# Patient Record
Sex: Male | Born: 1945 | Race: White | Hispanic: No | Marital: Married | Smoking: Never smoker
Health system: Southern US, Community
[De-identification: ages and names within clinical notes are randomized; demographics above are authoritative.]

## PROBLEM LIST (undated history)

## (undated) DIAGNOSIS — R7989 Other specified abnormal findings of blood chemistry: Secondary | ICD-10-CM

## (undated) DIAGNOSIS — R002 Palpitations: Secondary | ICD-10-CM

## (undated) DIAGNOSIS — R3915 Urgency of urination: Secondary | ICD-10-CM

## (undated) DIAGNOSIS — K219 Gastro-esophageal reflux disease without esophagitis: Secondary | ICD-10-CM

## (undated) DIAGNOSIS — R238 Other skin changes: Secondary | ICD-10-CM

## (undated) DIAGNOSIS — R011 Cardiac murmur, unspecified: Secondary | ICD-10-CM

## (undated) DIAGNOSIS — M1990 Unspecified osteoarthritis, unspecified site: Secondary | ICD-10-CM

## (undated) DIAGNOSIS — T7840XA Allergy, unspecified, initial encounter: Secondary | ICD-10-CM

## (undated) DIAGNOSIS — C4490 Unspecified malignant neoplasm of skin, unspecified: Secondary | ICD-10-CM

## (undated) DIAGNOSIS — I10 Essential (primary) hypertension: Secondary | ICD-10-CM

## (undated) DIAGNOSIS — R233 Spontaneous ecchymoses: Secondary | ICD-10-CM

## (undated) DIAGNOSIS — D696 Thrombocytopenia, unspecified: Secondary | ICD-10-CM

## (undated) HISTORY — PX: COLONOSCOPY W/ POLYPECTOMY: SHX1380

## (undated) HISTORY — DX: Other specified abnormal findings of blood chemistry: R79.89

## (undated) HISTORY — PX: TOTAL HIP ARTHROPLASTY: SHX124

## (undated) HISTORY — DX: Essential (primary) hypertension: I10

## (undated) HISTORY — DX: Allergy, unspecified, initial encounter: T78.40XA

## (undated) HISTORY — DX: Gastro-esophageal reflux disease without esophagitis: K21.9

## (undated) HISTORY — PX: BONE MARROW TRANSPLANT: SHX200

## (undated) HISTORY — PX: HERNIA REPAIR: SHX51

## (undated) HISTORY — DX: Palpitations: R00.2

## (undated) HISTORY — DX: Cardiac murmur, unspecified: R01.1

---

## 2002-03-26 ENCOUNTER — Encounter: Payer: Self-pay | Admitting: Emergency Medicine

## 2002-03-26 ENCOUNTER — Ambulatory Visit (HOSPITAL_COMMUNITY): Admission: EM | Admit: 2002-03-26 | Discharge: 2002-03-26 | Payer: Self-pay | Admitting: Emergency Medicine

## 2003-02-25 ENCOUNTER — Encounter (INDEPENDENT_AMBULATORY_CARE_PROVIDER_SITE_OTHER): Payer: Self-pay | Admitting: *Deleted

## 2003-02-25 ENCOUNTER — Ambulatory Visit (HOSPITAL_BASED_OUTPATIENT_CLINIC_OR_DEPARTMENT_OTHER): Admission: RE | Admit: 2003-02-25 | Discharge: 2003-02-25 | Payer: Self-pay | Admitting: Plastic Surgery

## 2004-08-17 ENCOUNTER — Ambulatory Visit: Payer: Self-pay | Admitting: Gastroenterology

## 2005-10-29 ENCOUNTER — Ambulatory Visit: Payer: Self-pay | Admitting: Gastroenterology

## 2005-12-10 ENCOUNTER — Ambulatory Visit: Payer: Self-pay | Admitting: Gastroenterology

## 2005-12-11 ENCOUNTER — Ambulatory Visit: Payer: Self-pay | Admitting: Endocrinology

## 2006-02-07 ENCOUNTER — Ambulatory Visit: Payer: Self-pay | Admitting: Endocrinology

## 2006-02-07 LAB — CONVERTED CEMR LAB
ALT: 15 units/L (ref 0–40)
AST: 20 units/L (ref 0–37)
Albumin: 3.6 g/dL (ref 3.5–5.2)
Alkaline Phosphatase: 61 units/L (ref 39–117)
BUN: 19 mg/dL (ref 6–23)
Bacteria, U Microscopic: NEGATIVE /hpf
Basophils Absolute: 0 10*3/uL (ref 0.0–0.1)
Basophils Relative: 0.7 % (ref 0.0–1.0)
Bilirubin Urine: NEGATIVE
CO2: 32 meq/L (ref 19–32)
Calcium: 9.1 mg/dL (ref 8.4–10.5)
Chloride: 107 meq/L (ref 96–112)
Chol/HDL Ratio, serum: 3.1
Cholesterol: 160 mg/dL (ref 0–200)
Creatinine, Ser: 0.9 mg/dL (ref 0.4–1.5)
Crystals: NEGATIVE
Eosinophil percent: 4.4 % (ref 0.0–5.0)
GFR calc non Af Amer: 91 mL/min
Glomerular Filtration Rate, Af Am: 110 mL/min/{1.73_m2}
Glucose, Bld: 107 mg/dL — ABNORMAL HIGH (ref 70–99)
HCT: 37.8 % — ABNORMAL LOW (ref 39.0–52.0)
HDL: 51.9 mg/dL (ref >39.0)
Hemoglobin: 13.2 g/dL (ref 13.0–17.0)
Ketones, ur: NEGATIVE mg/dL
LDL Cholesterol: 93 mg/dL (ref 0–99)
Leukocytes, UA: NEGATIVE
Lymphocytes Relative: 18 % (ref 12.0–46.0)
MCHC: 34.8 g/dL (ref 30.0–36.0)
MCV: 81.6 fL (ref 78.0–100.0)
Monocytes Absolute: 0.5 10*3/uL (ref 0.2–0.7)
Monocytes Relative: 6.7 % (ref 3.0–11.0)
Neutro Abs: 4.9 10*3/uL (ref 1.4–7.7)
Neutrophils Relative %: 70.2 % (ref 43.0–77.0)
Nitrite: NEGATIVE
Platelets: 437 10*3/uL — ABNORMAL HIGH (ref 150–400)
Potassium: 4.2 meq/L (ref 3.5–5.1)
RBC: 4.63 M/uL (ref 4.22–5.81)
RDW: 12.7 % (ref 11.5–14.6)
Sodium: 143 meq/L (ref 135–145)
Specific Gravity, Urine: >=1.03 (ref 1.000–1.03)
TSH: 0.88 microintl units/mL (ref 0.35–5.50)
Total Bilirubin: 0.6 mg/dL (ref 0.3–1.2)
Total Protein, Urine: NEGATIVE mg/dL
Total Protein: 6.2 g/dL (ref 6.0–8.3)
Triglyceride fasting, serum: 77 mg/dL (ref 0–149)
Urine Glucose: NEGATIVE mg/dL
Urobilinogen, UA: 0.2 (ref 0.0–1.0)
VLDL: 15 mg/dL (ref 0–40)
WBC: 7 10*3/uL (ref 4.5–10.5)
pH: 6 (ref 5.0–8.0)

## 2006-02-11 ENCOUNTER — Ambulatory Visit: Payer: Self-pay | Admitting: Endocrinology

## 2006-03-08 ENCOUNTER — Ambulatory Visit (HOSPITAL_COMMUNITY): Admission: RE | Admit: 2006-03-08 | Discharge: 2006-03-08 | Payer: Self-pay | Admitting: Endocrinology

## 2006-03-22 ENCOUNTER — Ambulatory Visit: Payer: Self-pay | Admitting: Gastroenterology

## 2006-04-12 ENCOUNTER — Encounter: Payer: Self-pay | Admitting: Gastroenterology

## 2006-04-12 ENCOUNTER — Ambulatory Visit: Payer: Self-pay | Admitting: Gastroenterology

## 2006-04-12 LAB — HM COLONOSCOPY

## 2006-10-11 ENCOUNTER — Encounter: Payer: Self-pay | Admitting: Endocrinology

## 2006-10-11 DIAGNOSIS — K219 Gastro-esophageal reflux disease without esophagitis: Secondary | ICD-10-CM | POA: Insufficient documentation

## 2007-03-07 ENCOUNTER — Ambulatory Visit: Payer: Self-pay | Admitting: Gastroenterology

## 2007-06-20 ENCOUNTER — Telehealth: Payer: Self-pay | Admitting: Gastroenterology

## 2007-07-23 ENCOUNTER — Telehealth: Payer: Self-pay | Admitting: Gastroenterology

## 2007-08-11 ENCOUNTER — Telehealth (INDEPENDENT_AMBULATORY_CARE_PROVIDER_SITE_OTHER): Payer: Self-pay | Admitting: *Deleted

## 2007-08-18 ENCOUNTER — Telehealth: Payer: Self-pay | Admitting: Gastroenterology

## 2007-08-27 ENCOUNTER — Telehealth: Payer: Self-pay | Admitting: Gastroenterology

## 2008-03-26 ENCOUNTER — Ambulatory Visit: Payer: Self-pay | Admitting: Endocrinology

## 2008-03-26 DIAGNOSIS — H6120 Impacted cerumen, unspecified ear: Secondary | ICD-10-CM

## 2009-12-05 ENCOUNTER — Encounter: Payer: Self-pay | Admitting: Endocrinology

## 2010-03-14 NOTE — Miscellaneous (Signed)
Summary: Immunization Entry   Immunization History:  Influenza Immunization History:    Influenza:  historical (12/03/2009)   Injection Site: Right Deltoid Route: IM Manu: [employer] Exp. Date: 08/12/2010 Lot Number: [id number]  immunization given at [facility] [address] Brenton Grills [place]  December 05, 2009 12:05 PM

## 2010-06-02 ENCOUNTER — Other Ambulatory Visit (INDEPENDENT_AMBULATORY_CARE_PROVIDER_SITE_OTHER): Payer: Medicare Other

## 2010-06-02 ENCOUNTER — Ambulatory Visit (INDEPENDENT_AMBULATORY_CARE_PROVIDER_SITE_OTHER): Payer: Medicare Other | Admitting: Endocrinology

## 2010-06-02 ENCOUNTER — Encounter: Payer: Self-pay | Admitting: Endocrinology

## 2010-06-02 ENCOUNTER — Other Ambulatory Visit: Payer: Self-pay | Admitting: Gastroenterology

## 2010-06-02 DIAGNOSIS — K219 Gastro-esophageal reflux disease without esophagitis: Secondary | ICD-10-CM

## 2010-06-02 DIAGNOSIS — E7800 Pure hypercholesterolemia, unspecified: Secondary | ICD-10-CM

## 2010-06-02 DIAGNOSIS — C61 Malignant neoplasm of prostate: Secondary | ICD-10-CM

## 2010-06-02 DIAGNOSIS — K4090 Unilateral inguinal hernia, without obstruction or gangrene, not specified as recurrent: Secondary | ICD-10-CM | POA: Insufficient documentation

## 2010-06-02 DIAGNOSIS — E119 Type 2 diabetes mellitus without complications: Secondary | ICD-10-CM

## 2010-06-02 DIAGNOSIS — Z0000 Encounter for general adult medical examination without abnormal findings: Secondary | ICD-10-CM

## 2010-06-02 DIAGNOSIS — N400 Enlarged prostate without lower urinary tract symptoms: Secondary | ICD-10-CM

## 2010-06-02 DIAGNOSIS — Z125 Encounter for screening for malignant neoplasm of prostate: Secondary | ICD-10-CM | POA: Insufficient documentation

## 2010-06-02 DIAGNOSIS — Z79899 Other long term (current) drug therapy: Secondary | ICD-10-CM

## 2010-06-02 DIAGNOSIS — Z136 Encounter for screening for cardiovascular disorders: Secondary | ICD-10-CM

## 2010-06-02 DIAGNOSIS — D649 Anemia, unspecified: Secondary | ICD-10-CM

## 2010-06-02 LAB — CBC WITH DIFFERENTIAL/PLATELET
Basophils Relative: 1.2 % (ref 0.0–3.0)
Eosinophils Absolute: 0.1 10*3/uL (ref 0.0–0.7)
HCT: 38.2 % — ABNORMAL LOW (ref 39.0–52.0)
Hemoglobin: 12.9 g/dL — ABNORMAL LOW (ref 13.0–17.0)
Lymphs Abs: 0.8 10*3/uL (ref 0.7–4.0)
MCHC: 33.8 g/dL (ref 30.0–36.0)
MCV: 83.1 fl (ref 78.0–100.0)
Monocytes Absolute: 0.4 10*3/uL (ref 0.1–1.0)
Neutro Abs: 4.2 10*3/uL (ref 1.4–7.7)
RBC: 4.6 Mil/uL (ref 4.22–5.81)
RDW: 15.4 % — ABNORMAL HIGH (ref 11.5–14.6)

## 2010-06-02 LAB — HEPATIC FUNCTION PANEL
AST: 27 U/L (ref 0–37)
Alkaline Phosphatase: 61 U/L (ref 39–117)
Total Bilirubin: 0.4 mg/dL (ref 0.3–1.2)

## 2010-06-02 LAB — LIPID PANEL
HDL: 49.4 mg/dL (ref >39.00)
Total CHOL/HDL Ratio: 3

## 2010-06-02 LAB — PSA: PSA: 2.67 ng/mL (ref 0.10–4.00)

## 2010-06-02 MED ORDER — TERBINAFINE HCL 250 MG PO TABS: 250.0000 mg | ORAL_TABLET | Freq: Every day | ORAL | Status: DC

## 2010-06-02 MED ORDER — ESOMEPRAZOLE MAGNESIUM 40 MG PO CPDR: 40.0000 mg | DELAYED_RELEASE_CAPSULE | Freq: Every day | ORAL | Status: DC

## 2010-06-02 NOTE — Telephone Encounter (Signed)
SENT NEXIUM TO PHARMACY LEFT SAMPLES FOR PT OUTFRONT HE WILL PICK UP AFTER HIS APPOINTMENT WITH PCP TODAY

## 2010-06-02 NOTE — Patient Instructions (Addendum)
please consider these measures for your health:  minimize alcohol.  do not use tobacco products.  have a colonoscopy at least every 10 years from age 65.  keep firearms safely stored.  always use seat belts.  have working smoke alarms in your home.  see an eye doctor and dentist regularly.  never drive under the influence of alcohol or drugs (including prescription drugs).  those with fair skin should take precautions against the sun. please let me know what your wishes would be, if artificial life support measures should become necessary.  it is critically important to prevent falling down (keep floor areas well-lit, dry, and free of loose objects) Here is a precsription for a shingles vaccine.  You can have a pharmacy give it, or if you bring it back here, we would be happy to give it.   Refer to dr gerkin.  you will be called with a day and time for an appointment. Please return in 1 year Here are some samples of nexium, until you see dr Arlyce Dice blood tests are being ordered for you today.  please call [phone] to hear your test results. i have sent a prescription for a drug for the toenail fungus (update:  we discussed code status.  pt requests full code, but would not want to be started or maintained on artificial life-support measures if there was not a reasonable chance of recovery) (update: i left message on phone-tree:  Increase nexium to 40 mg bid.  i'll ask dr Arlyce Dice to get you in sooner).

## 2010-06-02 NOTE — Progress Notes (Signed)
Subjective:   Patient here for Medicare annual wellness visit and management of other chronic and acute problems.     Risk factors: advanced age    Roster of Physicians Providing Medical Care to Patient: Sandria ManlyArlyce Dice   Activities of Daily Living: In your present state of health, do you have any difficulty performing the following activities?:  Preparing food and eating?: No  Bathing yourself: No  Getting dressed: No  Using the toilet:No  Moving around from place to place: No  In the past year have you fallen or had a near fall?:No    Home Safety: Has smoke detector and wears seat belts. No firearms. No excess sun exposure.  Diet and Exercise  Current exercise habits:  Dietary issues discussed: healthy diet   Depression Screen  Q1: Over the past two weeks, have you felt down, depressed or hopeless?no  Q2: Over the past two weeks, have you felt little interest or pleasure in doing things? no   The following portions of the patient's history were reviewed and updated as appropriate: allergies, current medications, past family history, past medical history, past social history, past surgical history and problem list.   Married. Works as Writer.   Review of Systems  Denies hearing loss, and visual loss. Objective:   Vision:  Sees opthalmologist Hearing: grossly normal Body mass index:  Se vs page Msk: pt easily and quickly performs "get-up-and-go" from a sitting position Cognitive Impairment Assessment: cognition, memory and judgment appear normal.  remembers 3/3 at 5 minutes.  excellent recall.  can easily read and write a sentence.  alert and oriented x 3   Assessment:   Medicare wellness utd on preventive parameters    Plan:   During the course of the visit the patient was educated and counseled about appropriate screening and preventive services including:       Fall prevention  Diabetes screening  Nutrition counseling   Vaccines / LABS Zostavax rx is given  to pt PSA  Patient Instructions (the written plan) was given to the patient.   SEPARATE EVALUATION FOLLOWS--EACH PROBLEM HERE IS NEW, NOT RESPONDING TO TREATMENT, OR POSES SIGNIFICANT RISK TO THE PATIENT'S HEALTH: HISTORY OF THE PRESENT ILLNESS: Pt has few years of slight swelling at the left inguinal area.  No assoc pain gers sxs have recurred.  He bought otc zegerid, but this gave insufficient relief. PAST MEDICAL HISTORY reviewed and up to date today REVIEW OF SYSTEMS: Denies n/v. PHYSICAL EXAMINATION: LUNGS:  Clear to auscultation HEART: Regular rate and rhythm without murmurs noted. Normal S1,S2.   Abd: There is a self-reducing left inguinal hernia LAB/XRAY RESULTS: Cbc is noted IMPRESSION: Persistent hernia Gerd, recurrent, but he now has anemia PLAN: See instruction page

## 2010-06-12 ENCOUNTER — Telehealth: Payer: Self-pay

## 2010-06-12 NOTE — Telephone Encounter (Signed)
Message copied by Chrystie Nose on Mon Jun 12, 2010  9:48 AM ------      Message from: Merri Ray      Created: Mon Jun 12, 2010  8:27 AM       Can you doublebook this pt in for Dr Arlyce Dice?      ----- Message -----         From: Louis Meckel, MD         Sent: 06/12/2010   5:08 AM           To: Merri Ray, CMA            OV and hemeoccults      ----- Message -----         From: Merri Ray, CMA         Sent: 06/09/2010   8:11 AM           To: Louis Meckel, MD            Does he need a EGD or a office visit??      ----- Message -----         From: Louis Meckel, MD         Sent: 06/08/2010   2:01 PM           To: Minus Breeding, MD, Merri Ray, CMA            No problem.  We get him in next week.  Robin, pleasee give the pt hemeoccults.      ----- Message -----         From: Minus Breeding, MD         Sent: 06/02/2010   7:25 PM           To: Louis Meckel, MD            Rob            Mr tift has recurrent gerd sxs, and he is now slightly anemic.  You may conclude he needs a repeat egd.  Can he get a sooner appt with you than late may?  He and i would appreciate it.            sean

## 2010-06-12 NOTE — Telephone Encounter (Signed)
Spoke with pt and scheduled him to see Dr. Arlyce Dice 06/14/10@2 :30pm. Pt verbalized understanding.

## 2010-06-14 ENCOUNTER — Encounter: Payer: Self-pay | Admitting: Gastroenterology

## 2010-06-14 ENCOUNTER — Ambulatory Visit (INDEPENDENT_AMBULATORY_CARE_PROVIDER_SITE_OTHER): Payer: Medicare Other | Admitting: Gastroenterology

## 2010-06-14 DIAGNOSIS — Z860100 Personal history of colon polyps, unspecified: Secondary | ICD-10-CM | POA: Insufficient documentation

## 2010-06-14 DIAGNOSIS — K219 Gastro-esophageal reflux disease without esophagitis: Secondary | ICD-10-CM

## 2010-06-14 NOTE — Patient Instructions (Addendum)
Cc. Thomas Cherry Your EGD is scheduled on 07/03/2010 at 2:30pm Upper GI Endoscopy Upper GI endoscopy means using a flexible scope to look at the esophagus, stomach and upper small bowel. This is done to make a diagnosis in people with heartburn, abdominal pain, or abnormal bleeding. Sometimes an endoscope is needed to remove foreign bodies or food that become stuck in the esophagus; it can also be used to take biopsy samples. For the best results, do not eat or drink for 8 hours before having your upper endoscopy.  To perform the endoscopy, you will probably be sedated and your throat will be numbed with a special spray. The endoscope is then slowly passed down your throat (this will not interfere with your breathing). An endoscopy exam takes 15-30 minutes to complete and there is no real pain. Patients rarely remember much about the procedure. The results of the test may take several days if a biopsy or other test is taken.  You may have a sore throat after an endoscopy exam. Serious complications are very rare. Stick to liquids and soft foods until your pain is better. You should not drive a car or operate any dangerous equipment for at least 24 hours after being sedated. SEEK IMMEDIATE MEDICAL CARE IF:  You have severe throat pain.   You have shortness of breath.   You have bleeding problems.   You have a fever.   You have difficulty recovering from your sedation.  Document Released: 03/08/2004 Document Re-Released: 04/25/2009 [facility] Patient Information 2011 [place], [place].

## 2010-06-14 NOTE — Assessment & Plan Note (Addendum)
Endoscopy 2004 demonstrated only an enlarged gastric fold. He's had recurrent symptoms that did not respond to Zegerid although they seem to be improving. He remains symptomatic, however.  In addition, he has a very mild anemia.  Conditions. #1 continue Nexium. #2 upper endoscopy

## 2010-06-14 NOTE — Assessment & Plan Note (Signed)
Repeat colonoscopy 2013

## 2010-06-14 NOTE — Progress Notes (Signed)
History of Present Illness:  Mr Vercher is a 65 year old white male with a history of GERD and colon polyps, here for evaluation of recurrent pyrosis. He's been on PPI therapy for years. After stopping it for a period time he remained symptom-free for several weeks, , but recently developed recurrent pyrosis which did not respond to Zegerid. Over the past few days, on Nexium, symptoms have improved although he still has a pressure in his chest that is unrelated to exercise or eating. He denies dysphagia, per se. He is on no gastric irritants.  Colonoscopy in 2008 he demonstrated an adenomatous polyp.    Review of Systems: Pertinent positive and negative review of systems were noted in the above HPI section. All other review of systems were otherwise negative.    Current Medications, Allergies, Past Medical History, Past Surgical History, Family History and Social History were reviewed in [employer] [facility] electronic medical record  Vital signs were reviewed in today's medical record. Physical Exam: General: Well developed , well nourished, no acute distress Head: Normocephalic and atraumatic Eyes:  sclerae anicteric, EOMI Ears: Normal auditory acuity Mouth: No deformity or lesions Lungs: Clear throughout to auscultation Heart: Regular rate and rhythm; no murmurs, rubs or bruits Abdomen: Soft, non tender and non distended. No masses, hepatosplenomegaly or hernias noted. Normal Bowel sounds Rectal:deferred Musculoskeletal: Symmetrical with no gross deformities  Pulses:  Normal pulses noted Extremities: No clubbing, cyanosis, edema or deformities noted Neurological: Alert oriented x 4, grossly nonfocal Psychological:  Alert and cooperative. Normal mood and affect

## 2010-06-27 NOTE — Assessment & Plan Note (Signed)
[facility]                         GASTROENTEROLOGY OFFICE NOTE   Thomas, Cherry                      MRN:          [id number]  DATE:03/07/2007                            DOB:          May 01, 1945    PROBLEM:  Reflux.   Thomas Cherry has returned for his annual visit.  Reflux symptoms are  well controlled with daily Nexium.  He is without chest pain or  dysphagia.  He has noticed a bulge in the left inguinal area, which he  feels is a hernia.  He is status post right inguinal hernia repair.   MEDICATIONS:  Nexium 40 mg a day.   EXAM:  Pulse 82, blood pressure 130/64, weight 154.  HEENT:  EOMI. PERRLA. Sclerae are anicteric.  Conjunctivae are pink.  NECK:  Supple without thyromegaly, adenopathy or carotid bruits.  CHEST:  Clear to auscultation and percussion without adventitious  sounds.  CARDIAC:  Regular rhythm; normal S1 S2.  There are no murmurs, gallops  or rubs.  ABDOMEN:  He has a reducible left inguinal hernia.  EXTREMITIES:  Full range of motion.  No cyanosis, clubbing or edema.  RECTAL:  Deferred.   IMPRESSION:  1. Gastroesophageal reflux disease - well controlled with Nexium.  2. History of colon polyps.  3. Left inguinal hernia.   RECOMMENDATION:  1. Renew Nexium.  2. Surgical consultation with Dr. Darnell Level for consideration of      herniorrhaphy.  3. Followup colonoscopy in 4 years.     Barbette Hair. Arlyce Dice, MD,FACG  Electronically Signed    RDK/MedQ  DD: 03/07/2007  DT: 03/07/2007  Job #: [id number]   cc:   Velora Heckler, MD

## 2010-06-30 NOTE — H&P (Signed)
Thomas Cherry, Thomas Cherry                           ACCOUNT NO.:  [id number]   MEDICAL RECORD NO.:  [id number]                   PATIENT TYPE:  EMS   LOCATION:  ED                                   FACILITY:  [facility]   PHYSICIAN:  Abigail Miyamoto, M.D.              DATE OF BIRTH:  Jul 02, 1945   DATE OF ADMISSION:  03/26/2002  DATE OF DISCHARGE:                                HISTORY & PHYSICAL   CHIEF COMPLAINT:  Incarcerated right inguinal hernia.   HISTORY OF PRESENT ILLNESS:  Thomas Cherry is a 65 year old gentleman who has  had a known right inguinal hernia for greater than five years that has  always been easily reduced. He also reports it always easily goes into the  scrotum. He developed cramping abdominal pain yesterday and then last night  noticed that the hernia was down the scrotum and he could no longer reduce  it, therefore, he presented to the emergency department today plus he has  had mild nausea but no emesis just mild cramping abdominal pain and he was  moving his bowels well and otherwise within complains. He has had no fevers,  chills, chest pain or shortness of breath.   PAST MEDICAL HISTORY:  Negative.   PAST SURGICAL HISTORY:  Negative.   MEDICATIONS:  Negative.   ALLERGIES:  No known drug allergies.   SOCIAL HISTORY:  He does not smoke and does not drink alcohol.   PHYSICAL EXAMINATION:  GENERAL:  Reveals a thin, well-developed, well-  nourished gentleman in no acute distress.  Generally he is well appearing.  VITAL SIGNS:  Temperature is 97.2, heart rate is 105, respiratory rate 18,  blood pressure 157/99.  HEENT:  He is anicteric. His oropharynx is clear.  NECK:  Supple.  LUNGS:  Clear to auscultation bilaterally.  CARDIOVASCULAR:  Regular rate and rhythm.  ABDOMEN:  Soft. There is no organomegaly. There is a large right inguinal  hernia going down in the scrotum. It cannot be reduced with the patient in  Trendelenburg position. There is no evidence of  left inguinal hernia.  GU:  The patient's testicles are descended bilaterally.  EXTREMITIES:  Warm and well perfused.   LABORATORY DATA:  The patient comes with a white blood cell count of 7.4,  hemoglobin 13.9, hematocrit of 41.1 and platelets of 330. CMET is pending.   ASSESSMENT/PLAN:  This is a 65 year old gentleman with incarcerated right  inguinal hernia that cannot be reduced despite a fair amount of direct  pressure. At this point, emergent repair is recommended. I discussed this  with the patient and his wife. I discussed the possibility of compromised  intestines and the possibility of the need for  bowel resection. We also discussed the risks of surgery including bleeding,  infection as well as the use of mesh should the hernia be reduced in the  operating room. At this point, he wishes to proceed.  The surgery must be  scheduled.                                                Abigail Miyamoto, M.D.    DB/MEDQ  D:  03/26/2002  T:  03/26/2002  Job:  [id number]

## 2010-06-30 NOTE — Assessment & Plan Note (Signed)
[facility]                           GASTROENTEROLOGY OFFICE NOTE   LECIL, TAPP                      MRN:          [id number]  DATE:12/10/2005                            DOB:          1945-08-03    Mr. Smaldone has returned for reevaluation.  He continues to have chest  discomfort consisting of pressure in his mid chest radiating to his jaw.  It  is non-exertional.  It is the same pain that he has had over several years.  After switching to Nexium he has been symptom-free for the past 5 days.  The  pain is not exertionally related.  In the past it was felt to be due to acid  reflux.   EXAM:  Pulse 56, blood pressure 130/80, weight 151.   IMPRESSION:  Non-exertional chest pain.  While I believe this is most likely  related to acid reflux, radiation of pain raises the question of cardiac  ischemia.   RECOMMENDATIONS:  1. Continue Nexium.  2. If symptoms recur I will repeat his endoscopy (last endoscopy in 2004).  3. EKG today.  4. Refer to primary care (Dr. Everardo All) to establish primary care and to      consider further cardiac workup including a stress test.     Barbette Hair. Arlyce Dice, MD,FACG    RDK/MedQ  DD: 12/10/2005  DT: 12/10/2005  Job #: [id number]   cc:   Gregary Signs A. Everardo All, MD

## 2010-06-30 NOTE — Op Note (Signed)
NAMEJONAN, Cherry                           ACCOUNT NO.:  [id number]   MEDICAL RECORD NO.:  [id number]                   PATIENT TYPE:  EMS   LOCATION:  ED                                   FACILITY:  [facility]   PHYSICIAN:  Abigail Miyamoto, M.D.              DATE OF BIRTH:  1945/07/29   DATE OF PROCEDURE:  03/26/2002  DATE OF DISCHARGE:                                 OPERATIVE REPORT   PREOPERATIVE DIAGNOSIS:  Incarcerated right inguinal hernia.   POSTOPERATIVE DIAGNOSIS:  Incarcerated right inguinal hernia.   PROCEDURE:  Right inguinal hernia, repaired with mesh.   SURGEON:  Douglas A. Magnus Ivan, M.D.   ANESTHESIA:  General endotracheal anesthesia with 0.25% Marcaine.   ESTIMATED BLOOD LOSS:  Minimal.   INDICATIONS:  Thomas Cherry is a 65 year old gentleman who has had a known  right inguinal hernia for greater than 5 years.  He developed crampy  abdominal pain yesterday and presented to the emergency department at [facility] with this hernia stuck into the groin.  Despite several attempts to  reduce this preoperatively, it was unable to be reduced.  Therefore, the  decision was made to proceed to the operating room.   FINDINGS:  The patient was found to have an incarcerated hernia without  evidence of compromised bowel.   PROCEDURE IN DETAIL:  The patient was brought to the operating room and was  identified as Thomas Cherry.  He was placed prone on the operating table,  and general anesthesia was induced.  Next, after manipulation of the hernia,  it became easily reducible, and the patient was paralyzed.  At this point,  the patient's groin was then prepped and draped in the usual sterile  fashion.  Using a #10 blade, a small longitudinal incision was made in the  right groin.  The incision was carried down through the Scarpa's fascia with  electrocautery.  The external oblique fascia was then identified and opened  with the Metzenbaum scissors.  The underlying  testicular cord structures  along with a large indirect hernia sac were identified and controlled  circumferentially with a Penrose drain.  The inguinal hernia sac was then  identified and found to be thick and chronic in nature.  It was separated  from the rest of the cord structures.  The sac was then opened, and no bowel  was found to be now contained in the sac.  A finger could be passed into the  peritoneal cavity.  The sac was dissected down to its base, which was then  closed with a 3-0 silk suture.  The redundant sac was then excised with  electrocautery.  The preperitoneal space was then slightly dissected out  with finger dissection.  A piece of Prolene mesh from the _______ hernia  system was brought onto the field. The mesh was a large piece of mesh.  The  preperitoneal section was then  placed into the preperitoneal space and  unfolded.  The overlay portion was then placed through the inguinal floor  and opened up, and a slit was cut into it to incorporate the cord  structures.  The mesh was then sewn in place circumferentially, sewing it to  the pubic tubercle and transversalis fascia, shelving the edge of the  inguinal ligament laterally and then brought around the cord structures.  Good coverage of the hernia defect in the inguinal floor appeared to be  achieved.  The external oblique fascia was then closed with a running 2-0  Vicryl suture.  The Scarpa's fascia was then closed with interrupted 3-0  Vicryl suture, and the skin was closed with a running 4-0 Monocryl.  Steri-  Strips, gauze, and tape were then applied.  The patient tolerated the procedure well.  All sponge, needle, and  instrument counts were correct at the end of the procedure.  The patient was  then extubated in the operating room and taken in a stable condition to the  recovery room.                                               Abigail Miyamoto, M.D.    DB/MEDQ  D:  03/26/2002  T:  03/26/2002  Job:   [id number]

## 2010-06-30 NOTE — Assessment & Plan Note (Signed)
[facility]                           GASTROENTEROLOGY OFFICE NOTE   DAEGAN, ARIZMENDI                      MRN:          [id number]  DATE:10/29/2005                            DOB:          [birth year]    PROBLEM:  Reflux.   HISTORY:  Mr. Macmillan has returned for scheduled visit.  Despite taking  Protonix daily, he is having frequent breakthrough pyrosis.  He actually is  taking Protonix mid morning and as his first meal at noon.  He denies  dysphagia.   PHYSICAL EXAMINATION:  VITAL SIGNS:  Pulse 64, blood pressure 130/70, weight  156.  HEENT: EOMI. PERRLA. Sclerae are anicteric.  Conjunctivae are pink.  NECK:  Supple without thyromegaly, adenopathy or carotid bruits.  CHEST:  Clear to auscultation and percussion without adventitious sounds.  CARDIAC:  Regular rhythm; normal S1 S2.  There are no murmurs, gallops or  rubs.  ABDOMEN:  Bowel sounds are normoactive.  Abdomen is soft, non-tender and non-  distended.  There are no abdominal masses, tenderness, splenic enlargement  or hepatomegaly.  EXTREMITIES:  Full range of motion.  No cyanosis, clubbing or edema.  Rectal exam deferred..   IMPRESSION:  Gastroesophageal reflux disease, currently refractory to  Protonix.   RECOMMENDATIONS:  1. I instructed Mr. Mich to take his Protonix first thing in the      morning followed by eating 20-30 minutes.  Failing this, I have given      him samples of several PPIs.  2. Screening colonoscopy.  Patient says he will schedule this sometime in      the next year.                                   Barbette Hair. Arlyce Dice, MD,FACG   RDK/MedQ  DD:  10/29/2005  DT:  10/30/2005  Job #:  [id number]

## 2010-07-03 ENCOUNTER — Ambulatory Visit (AMBULATORY_SURGERY_CENTER): Payer: Medicare Other | Admitting: Gastroenterology

## 2010-07-03 ENCOUNTER — Encounter: Payer: Self-pay | Admitting: Gastroenterology

## 2010-07-03 VITALS — BP 156/79 | HR 84 | Temp 97.4°F | Resp 20 | Ht 69.0 in | Wt 150.0 lb

## 2010-07-03 DIAGNOSIS — K219 Gastro-esophageal reflux disease without esophagitis: Secondary | ICD-10-CM

## 2010-07-03 MED ORDER — SODIUM CHLORIDE 0.9 % IV SOLN: 500.0000 mL | INTRAVENOUS | Status: DC

## 2010-07-03 NOTE — Patient Instructions (Signed)
NORMAL ENDOSCOPY  CONTINUE YOUR REFLUX MEDICINE AS DIRECTED  CARE PARTNER AND PATIENT GIVEN DISCHARGE INSTRUCTIONS PER BLUE/GREEN SHEETS

## 2010-07-04 ENCOUNTER — Telehealth: Payer: Self-pay

## 2010-07-04 NOTE — Telephone Encounter (Signed)

## 2010-07-11 ENCOUNTER — Ambulatory Visit: Payer: Self-pay | Admitting: Gastroenterology

## 2010-11-16 ENCOUNTER — Encounter: Payer: Self-pay | Admitting: Endocrinology

## 2010-11-16 ENCOUNTER — Ambulatory Visit (INDEPENDENT_AMBULATORY_CARE_PROVIDER_SITE_OTHER): Payer: Medicare Other | Admitting: Endocrinology

## 2010-11-16 ENCOUNTER — Ambulatory Visit: Payer: Medicare Other

## 2010-11-16 ENCOUNTER — Telehealth: Payer: Self-pay | Admitting: *Deleted

## 2010-11-16 VITALS — BP 142/92 | HR 79 | Temp 98.1°F | Ht 69.0 in | Wt 150.8 lb

## 2010-11-16 DIAGNOSIS — Z125 Encounter for screening for malignant neoplasm of prostate: Secondary | ICD-10-CM

## 2010-11-16 DIAGNOSIS — D473 Essential (hemorrhagic) thrombocythemia: Secondary | ICD-10-CM

## 2010-11-16 DIAGNOSIS — M79675 Pain in left toe(s): Secondary | ICD-10-CM

## 2010-11-16 DIAGNOSIS — E7800 Pure hypercholesterolemia, unspecified: Secondary | ICD-10-CM

## 2010-11-16 DIAGNOSIS — I10 Essential (primary) hypertension: Secondary | ICD-10-CM

## 2010-11-16 DIAGNOSIS — Z79899 Other long term (current) drug therapy: Secondary | ICD-10-CM

## 2010-11-16 DIAGNOSIS — R209 Unspecified disturbances of skin sensation: Secondary | ICD-10-CM

## 2010-11-16 DIAGNOSIS — R21 Rash and other nonspecific skin eruption: Secondary | ICD-10-CM

## 2010-11-16 LAB — CBC WITH DIFFERENTIAL/PLATELET
Basophils Relative: 0.4 % (ref 0.0–3.0)
Eosinophils Absolute: 0.4 10*3/uL (ref 0.0–0.7)
Eosinophils Relative: 3.9 % (ref 0.0–5.0)
HCT: 35.5 % — ABNORMAL LOW (ref 39.0–52.0)
Hemoglobin: 11.6 g/dL — ABNORMAL LOW (ref 13.0–17.0)
Lymphs Abs: 1.5 10*3/uL (ref 0.7–4.0)
Lymphs Abs: 1.5 10*3/uL (ref 0.7–4.0)
MCH: 26.9 pg (ref 26.0–34.0)
MCV: 84.1 fl (ref 78.0–100.0)
MCV: 82.4 fL (ref 78.0–100.0)
Monocytes Absolute: 0.5 10*3/uL (ref 0.1–1.0)
Monocytes Relative: 8 % (ref 3–12)
Neutro Abs: 6.8 10*3/uL (ref 1.4–7.7)
Neutrophils Relative %: 70 % (ref 43–77)
Platelets: 1063 10*3/uL — ABNORMAL HIGH (ref 150.0–400.0)
RBC: 4.32 MIL/uL (ref 4.22–5.81)
WBC: 9.3 10*3/uL (ref 4.5–10.5)

## 2010-11-16 LAB — URINALYSIS, ROUTINE W REFLEX MICROSCOPIC
Nitrite: NEGATIVE
Total Protein, Urine: NEGATIVE
pH: 6 (ref 5.0–8.0)

## 2010-11-16 MED ORDER — TRIAMCINOLONE ACETONIDE 0.025 % EX CREA: TOPICAL_CREAM | Freq: Three times a day (TID) | CUTANEOUS | Status: DC

## 2010-11-16 MED ORDER — DICLOFENAC SODIUM 1 % TD GEL: 1.0000 "application " | Freq: Four times a day (QID) | TRANSDERMAL | Status: DC

## 2010-11-16 NOTE — Patient Instructions (Addendum)
i have sent a prescription to your pharmacy, for a steroid cream for the rash, and also for the sensation on your chest. I hope you feel better soon.  If you don't feel better by next week, please call back.  Please schedule a "medicare wellness" appointment.  We'll recheck your blood pressure then.  Refer to a foot specialist.  you will receive a phone call, about a day and time for an appointment. blood tests are being requested for you today.  please call [phone] to hear your test results.  You will be prompted to enter the 9-digit "MRN" number that appears at the top left of this page, followed by #.  Then you will hear the message. (update: i left message on phone-tree:  Ref hematology).

## 2010-11-16 NOTE — Progress Notes (Signed)
  Subjective:    Patient ID: Thomas Cherry, male    DOB: 1945-10-25, 65 y.o.   MRN: [id number]  HPI Pt states a few weeks of moderate rash at the legs (right >> left).  He had assoc itching, but that is much better.  He thinks it might have been related to moving the lawn.   He also has a few mos of slight pain at the skin on the anterior chest wall.  Motrin helps.  He insists the sxs are on the skin, and not at the chest wall. Past Medical History  Diagnosis Date  . GERD (gastroesophageal reflux disease)   . Allergy     heyfever    Past Surgical History  Procedure Date  . Hernia repair     History   Social History  . Marital Status: Married    Spouse Name: N/A    Number of Children: N/A  . Years of Education: N/A   Occupational History  . court reporter    Social History Main Topics  . Smoking status: Never Smoker   . Smokeless tobacco: Never Used  . Alcohol Use: No  . Drug Use: No  . Sexually Active: Not on file   Other Topics Concern  . Not on file   Social History Narrative  . No narrative on file    Current Outpatient Prescriptions on File Prior to Visit  Medication Sig Dispense Refill  . esomeprazole (NEXIUM) 40 MG capsule Take 1 capsule (40 mg total) by mouth daily before breakfast.  30 capsule  3   Current Facility-Administered Medications on File Prior to Visit  Medication Dose Route Frequency Provider Last Rate Last Dose  . 0.9 %  sodium chloride infusion  500 mL Intravenous Continuous Louis Meckel, MD        No Known Allergies  Family History  Problem Relation Age of Onset  . Ovarian cancer    . Uterine cancer    . Colitis      crohns  . Cancer Mother   . Cancer Father     BP 142/92  Pulse 79  Temp(Src) 98.1 F (36.7 C) (Oral)  Ht 5\' 9"  (1.753 m)  Wt 150 lb 12.8 oz (68.402 kg)  BMI 22.27 kg/m2  SpO2 97%  Review of Systems Denies sob and fever.      Objective:   Physical Exam VITAL SIGNS:  See vs page.   GENERAL: no  distress. chest wall: minimally swollen skin to the left of center.  No tend/erythema/rash.   LUNGS:  Clear to auscultation HEART: Regular rate and rhythm without murmurs noted. Normal S1,S2.   Skin: both legs: well-demarcated red rash, approx 8x8 cm, but bigger on the right.  No open ulcer.     Plts=1,600,000    Assessment & Plan:  Thrombocytosis.   prob contributed to by the steroid injection, but still worse. Rash, uncertain etiology.   Htn, ? Situational Dysesthesia of the anterior chest.  uncertain etiology.  new

## 2010-11-16 NOTE — Telephone Encounter (Signed)
Lab called with critical - PLT 1.63 million, lab is sending to outside lab to confirm. MD verbally informed.

## 2010-11-17 LAB — BASIC METABOLIC PANEL
CO2: 30 mEq/L (ref 19–32)
GFR: 68.23 mL/min (ref >60.00)
Glucose, Bld: 94 mg/dL (ref 70–99)
Potassium: 4.7 mEq/L (ref 3.5–5.1)
Sodium: 140 mEq/L (ref 135–145)

## 2010-11-17 LAB — HEPATIC FUNCTION PANEL
ALT: 24 U/L (ref 0–53)
AST: 28 U/L (ref 0–37)
Bilirubin, Direct: 0.1 mg/dL (ref 0.0–0.3)
Total Bilirubin: 0.6 mg/dL (ref 0.3–1.2)

## 2010-11-17 NOTE — Progress Notes (Signed)
  Subjective:    Patient ID: Thomas Cherry, male    DOB: 16-Mar-1945, 65 y.o.   MRN: [id number]  HPI (pt has a seborrheic keratosis on the mid-back.  In prepping with alcohol, it falls off.  bleeding spontaneously stops after 2 drops. )    Review of Systems     Objective:   Physical Exam        Assessment & Plan:

## 2010-11-24 ENCOUNTER — Telehealth: Payer: Self-pay | Admitting: *Deleted

## 2010-11-24 DIAGNOSIS — R21 Rash and other nonspecific skin eruption: Secondary | ICD-10-CM | POA: Insufficient documentation

## 2010-11-24 NOTE — Telephone Encounter (Signed)
Pt informed of referral

## 2010-11-24 NOTE — Telephone Encounter (Signed)
done

## 2010-11-24 NOTE — Telephone Encounter (Signed)
Pt states that rash he was seen for at OV last week is spreading, and that cream that was rx'd only helps a little. Pt is asking for MD's advisement on whether he needs OV or referral to dermatology.

## 2010-11-28 ENCOUNTER — Encounter: Payer: Medicare Other | Admitting: Internal Medicine

## 2010-11-29 ENCOUNTER — Other Ambulatory Visit: Payer: Self-pay | Admitting: Internal Medicine

## 2010-11-29 ENCOUNTER — Encounter (HOSPITAL_BASED_OUTPATIENT_CLINIC_OR_DEPARTMENT_OTHER): Payer: Medicare Other | Admitting: Internal Medicine

## 2010-11-29 DIAGNOSIS — D473 Essential (hemorrhagic) thrombocythemia: Secondary | ICD-10-CM

## 2010-11-29 LAB — CBC WITH DIFFERENTIAL/PLATELET
Basophils Absolute: 0.2 10*3/uL — ABNORMAL HIGH (ref 0.0–0.1)
Eosinophils Absolute: 0.4 10*3/uL (ref 0.0–0.5)
HGB: 11.2 g/dL — ABNORMAL LOW (ref 13.0–17.1)
MONO#: 0.4 10*3/uL (ref 0.1–0.9)
NEUT#: 5.5 10*3/uL (ref 1.5–6.5)
RDW: 15.1 % — ABNORMAL HIGH (ref 11.0–14.6)
lymph#: 1.4 10*3/uL (ref 0.9–3.3)

## 2010-11-29 LAB — LACTATE DEHYDROGENASE: LDH: 240 U/L (ref 94–250)

## 2010-11-29 LAB — COMPREHENSIVE METABOLIC PANEL
Albumin: 4.3 g/dL (ref 3.5–5.2)
CO2: 29 mEq/L (ref 19–32)
Glucose, Bld: 103 mg/dL — ABNORMAL HIGH (ref 70–99)
Potassium: 4.5 mEq/L (ref 3.5–5.3)
Sodium: 143 mEq/L (ref 135–145)
Total Protein: 6.3 g/dL (ref 6.0–8.3)

## 2010-12-14 ENCOUNTER — Telehealth: Payer: Self-pay | Admitting: Internal Medicine

## 2010-12-14 ENCOUNTER — Other Ambulatory Visit: Payer: Self-pay | Admitting: Internal Medicine

## 2010-12-14 ENCOUNTER — Encounter (HOSPITAL_BASED_OUTPATIENT_CLINIC_OR_DEPARTMENT_OTHER): Payer: Medicare Other | Admitting: Internal Medicine

## 2010-12-14 DIAGNOSIS — D473 Essential (hemorrhagic) thrombocythemia: Secondary | ICD-10-CM

## 2010-12-14 LAB — IRON AND TIBC: UIBC: 272 ug/dL (ref 125–400)

## 2010-12-14 LAB — COMPREHENSIVE METABOLIC PANEL
ALT: 20 U/L (ref 0–53)
Albumin: 4.2 g/dL (ref 3.5–5.2)
Alkaline Phosphatase: 69 U/L (ref 39–117)
CO2: 26 mEq/L (ref 19–32)
Glucose, Bld: 94 mg/dL (ref 70–99)
Potassium: 4.6 mEq/L (ref 3.5–5.3)
Sodium: 143 mEq/L (ref 135–145)
Total Protein: 6.5 g/dL (ref 6.0–8.3)

## 2010-12-14 LAB — CBC WITH DIFFERENTIAL/PLATELET
BASO%: 0.8 % (ref 0.0–2.0)
EOS%: 2.3 % (ref 0.0–7.0)
LYMPH%: 11.7 % — ABNORMAL LOW (ref 14.0–49.0)
MCH: 27.9 pg (ref 27.2–33.4)
MCHC: 33.1 g/dL (ref 32.0–36.0)
MONO#: 0.6 10*3/uL (ref 0.1–0.9)
Platelets: 1522 10*3/uL — ABNORMAL HIGH (ref 140–400)
RBC: 4.26 10*6/uL (ref 4.20–5.82)
WBC: 9.3 10*3/uL (ref 4.0–10.3)
lymph#: 1.1 10*3/uL (ref 0.9–3.3)

## 2010-12-18 ENCOUNTER — Telehealth: Payer: Self-pay | Admitting: Internal Medicine

## 2010-12-18 NOTE — Progress Notes (Signed)
CC:   Thomas Cherry, Thomas Cherry Barbette Hair. Arlyce Dice, Thomas Cherry,FACG  PRINCIPAL DIAGNOSIS:  Thrombocytosis suspicious for essential thrombocythemia versus reactive.  CURRENT THERAPY:  Aspirin 81 mg p.o. daily. interim history Thomas Cherry came to the clinic today accompanied by his wife for followup visit. The patient is feeling fine, no specific complaints.  He started taking aspirin since his last visit with me on November 29, 2010.  He has no significant complaints and not having any bleeding issues.  Denied having any bruises or ecchymosis.  The patient was supposed to have JAK2 mutation performed at his last visit; but, unfortunately, this lab test was not done and it was just done earlier today.  He is here for followup visit and evaluation of his thrombocytosis.  REVIEW OF SYSTEMS:  Negative.  PHYSICAL EXAMINATION:  Vital Signs:  Blood pressure 165/89, pulse 84, respiratory rate 20, temperature 97.6, weight 147.7 pounds.  General Exam:  Very pleasant 65 year old white male, alert, no acute distress. HEENT:  normocephalic, atraumatic.  Clear oropharynx.  Neck Exam: Supple, no palpable lymphadenopathy.  Chest Exam:  Clear to auscultation.  No wheezes or crackles.  Cardiovascular Exam:  Normal S1, S2.  No murmur or gallops.  Abdomen Exam:  Soft, nontender, nondistended.  No masses.  EXTREMITIES:  No edema.  LABORATORY DATA:  CBC today showed white blood count 9.3, hemoglobin 11.9, hematocrit 36.0, platelets 1,522,000.  Comprehensive metabolic panel, LDH, iron study, and ferritin are still pending.  ASSESSMENT AND PLAN:  This is a very pleasant 65 year old white male with thrombocytosis questionable for reactive versus essential thrombocythemia.  We will order the JAK2 mutation again today in addition to the iron study that is still pending.  I recommend for the patient to start treatment with hydroxyurea 500 mg p.o. daily because his platelet count is highly elevated today.  I discussed with  the patient the adverse effects of the treatment with hydroxyurea including alopecia, myelosuppression, nausea, vomiting, and fatigue, and the patient would like to proceed with the treatment as planned.  I will see him back for followup visit in 2 weeks for re-evaluation and discussion of his treatment options based on the final result of the JAK2 mutation and the iron study.  He was advised to call immediately if he has any concerning symptoms in the interval.    ______________________________ Lajuana Matte, M.D. MKM/MEDQ  D:  12/16/2010  T:  12/18/2010  Job:  [id number]

## 2010-12-18 NOTE — Telephone Encounter (Signed)
Called in rx for integra to [facility]

## 2010-12-19 ENCOUNTER — Telehealth: Payer: Self-pay | Admitting: Internal Medicine

## 2010-12-19 LAB — JAK-2 V617F

## 2010-12-19 NOTE — Telephone Encounter (Signed)
Pt.notified

## 2010-12-19 NOTE — Telephone Encounter (Signed)
Cc to Thomas Cherry 

## 2010-12-28 ENCOUNTER — Ambulatory Visit (INDEPENDENT_AMBULATORY_CARE_PROVIDER_SITE_OTHER): Payer: Medicare Other | Admitting: Endocrinology

## 2010-12-28 ENCOUNTER — Encounter: Payer: Self-pay | Admitting: Endocrinology

## 2010-12-28 ENCOUNTER — Other Ambulatory Visit (HOSPITAL_BASED_OUTPATIENT_CLINIC_OR_DEPARTMENT_OTHER): Payer: Medicare Other | Admitting: Lab

## 2010-12-28 ENCOUNTER — Other Ambulatory Visit: Payer: Self-pay | Admitting: Internal Medicine

## 2010-12-28 ENCOUNTER — Ambulatory Visit: Payer: Medicare Other

## 2010-12-28 ENCOUNTER — Ambulatory Visit (HOSPITAL_BASED_OUTPATIENT_CLINIC_OR_DEPARTMENT_OTHER): Payer: Medicare Other | Admitting: Internal Medicine

## 2010-12-28 ENCOUNTER — Telehealth: Payer: Self-pay | Admitting: Internal Medicine

## 2010-12-28 VITALS — BP 152/74 | HR 73 | Temp 98.4°F | Ht 69.0 in | Wt 147.0 lb

## 2010-12-28 VITALS — BP 178/85 | HR 71 | Temp 98.1°F | Ht 69.0 in | Wt 147.1 lb

## 2010-12-28 DIAGNOSIS — I10 Essential (primary) hypertension: Secondary | ICD-10-CM

## 2010-12-28 DIAGNOSIS — D471 Chronic myeloproliferative disease: Secondary | ICD-10-CM

## 2010-12-28 DIAGNOSIS — D473 Essential (hemorrhagic) thrombocythemia: Secondary | ICD-10-CM

## 2010-12-28 DIAGNOSIS — Z23 Encounter for immunization: Secondary | ICD-10-CM

## 2010-12-28 LAB — CBC WITH DIFFERENTIAL/PLATELET
BASO%: 0.4 % (ref 0.0–2.0)
Basophils Absolute: 0 10*3/uL (ref 0.0–0.1)
EOS%: 1 % (ref 0.0–7.0)
HCT: 35.4 % — ABNORMAL LOW (ref 38.4–49.9)
HGB: 11.7 g/dL — ABNORMAL LOW (ref 13.0–17.1)
LYMPH%: 18.6 % (ref 14.0–49.0)
MCH: 28 pg (ref 27.2–33.4)
MCHC: 33 g/dL (ref 32.0–36.0)
MCV: 84.8 fL (ref 79.3–98.0)
MONO%: 5.9 % (ref 0.0–14.0)
NEUT%: 74.1 % (ref 39.0–75.0)
Platelets: 1109 10*3/uL — ABNORMAL HIGH (ref 140–400)
lymph#: 1.2 10*3/uL (ref 0.9–3.3)

## 2010-12-28 MED ORDER — LOSARTAN POTASSIUM 50 MG PO TABS: 50.0000 mg | ORAL_TABLET | Freq: Every day | ORAL | Status: DC

## 2010-12-28 NOTE — Telephone Encounter (Signed)
gve the pt his nov 2012 appt calendar °

## 2010-12-28 NOTE — Progress Notes (Signed)
No images are attached to the encounter. No scans are attached to the encounter. No scans are attached to the encounter. Thomas Cherry OFFICE PROGRESS NOTE  Thomas Belling, MD, MD [address] [facility] [address] [place] [address]  DIAGNOSIS: Thrombocytosis suspicious for essential thrombocythemia versus reactive.  PRIOR THERAPY: None.  CURRENT THERAPY: #1 hydroxyurea 500 mg by mouth daily started 2 weeks ago. #2 aspirin 81 mg by mouth daily.  INTERVAL HISTORY: Thomas Cherry 65 y.o. male returns to the clinic today for followup visit. The patient tolerated his treatment with hydroxyurea and aspirin fairly well. He denied having any significant nausea or vomiting. He denied having any weight loss or night sweats. No bleeding issues. Molecular studies for JAK-2 mutation was performed and it was negative.  MEDICAL HISTORY: Past Medical History  Diagnosis Date  . GERD (gastroesophageal reflux disease)   . Allergy     heyfever    ALLERGIES:   has no known allergies.  MEDICATIONS:  Current Outpatient Prescriptions  Medication Sig Dispense Refill  . acyclovir (ZOVIRAX) 400 MG tablet Take 400 mg by mouth 2 (two) times daily.        . clobetasol cream (TEMOVATE) 0.05 % Apply topically as needed.        . diclofenac sodium (VOLTAREN) 1 % GEL Apply 1 application topically 4 (four) times daily. As needed for pain  1 Tube  1  . esomeprazole (NEXIUM) 40 MG capsule Take 1 capsule (40 mg total) by mouth daily before breakfast.  30 capsule  3  . FeFum-FePoly-FA-B Cmp-C-Biot (INTEGRA PLUS) CAPS Take 1 capsule by mouth daily.        . hydroxyurea (HYDREA) 500 MG capsule Take 500 mg by mouth daily. May take with food to minimize GI side effects.       Marland Kitchen losartan (COZAAR) 50 MG tablet Take 1 tablet (50 mg total) by mouth daily.  90 tablet  3  . triamcinolone (KENALOG) 0.025 % cream Apply topically 3 (three) times daily. As needed for rash  30 g  1   Current Facility-Administered  Medications  Medication Dose Route Frequency Provider Last Rate Last Dose  . 0.9 %  sodium chloride infusion  500 mL Intravenous Continuous Louis Meckel, MD        SURGICAL HISTORY:  Past Surgical History  Procedure Date  . Hernia repair     REVIEW OF SYSTEMS:  A comprehensive review of systems was negative.   PHYSICAL EXAMINATION: General appearance: alert, cooperative and no distress Head: Normocephalic, without obvious abnormality, atraumatic Neck: no adenopathy Lymph nodes: Cervical, supraclavicular, and axillary nodes normal. Resp: clear to auscultation bilaterally Cardio: regular rate and rhythm, S1, S2 normal, no murmur, click, rub or gallop GI: soft, non-tender; bowel sounds normal; no masses,  no organomegaly Extremities: extremities normal, atraumatic, no cyanosis or edema Neurologic: Alert and oriented X 3, normal strength and tone. Normal symmetric reflexes. Normal coordination and gait  ECOG PERFORMANCE STATUS: 1 - Symptomatic but completely ambulatory  Blood pressure 178/85, pulse 71, temperature 98.1 F (36.7 C), temperature source Oral, height 5\' 9"  (1.753 m), weight 147 lb 1.6 oz (66.724 kg).  LABORATORY DATA: Lab Results  Component Value Date   WBC 6.4 12/28/2010   HGB 11.7* 12/28/2010   HCT 35.4* 12/28/2010   MCV 84.8 12/28/2010   PLT 1,109* 12/28/2010      Chemistry      Component Value Date/Time   NA 143 12/14/2010 1124   NA  143 12/14/2010 1124   K 4.6 12/14/2010 1124   K 4.6 12/14/2010 1124   CL 106 12/14/2010 1124   CL 106 12/14/2010 1124   CO2 26 12/14/2010 1124   CO2 26 12/14/2010 1124   BUN 27* 12/14/2010 1124   BUN 27* 12/14/2010 1124   CREATININE 1.17 12/14/2010 1124   CREATININE 1.17 12/14/2010 1124      Component Value Date/Time   CALCIUM 9.4 12/14/2010 1124   CALCIUM 9.4 12/14/2010 1124   ALKPHOS 69 12/14/2010 1124   ALKPHOS 69 12/14/2010 1124   AST 21 12/14/2010 1124   AST 21 12/14/2010 1124   ALT 20 12/14/2010 1124   ALT 20 12/14/2010 1124    BILITOT 0.6 12/14/2010 1124   BILITOT 0.6 12/14/2010 1124       RADIOGRAPHIC STUDIES: No results found.  ASSESSMENT: This is a pleasant 65 years old white male with thrombocytosis suspicious for essential thrombocythemia versus reactive thrombocytosis. The JAK-2 mutation is negative, but this does not completely exclude essential thrombocythemia. His platelet count is down after treatment with hydroxyurea.   PLAN:  #1 check molecular study for BCR/ABL to  Role out myeloproliferative disorder. #2 continue her treatment with hydroxyurea and aspirin for now. #3 the patient would come back for followup visit in 2 weeks for evaluation and repeat CBC.  All questions were answered. The patient knows to call the clinic with any problems, questions or concerns. We can certainly see the patient much sooner if necessary.

## 2010-12-28 NOTE — Progress Notes (Signed)
  Subjective:    Patient ID: Thomas Cherry, male    DOB: 02-03-1946, 65 y.o.   MRN: [id number]  HPI Pt says his sxs (rash, chest-wall pain, and toe pain) have all resolved.  He sees dr Arbutus Ped for his thrombocytosis.  He says his bp elevation is situational.  He says at [facility], it is still 140 systolic.   Past Medical History  Diagnosis Date  . GERD (gastroesophageal reflux disease)   . Allergy     heyfever    Past Surgical History  Procedure Date  . Hernia repair     History   Social History  . Marital Status: Married    Spouse Name: N/A    Number of Children: N/A  . Years of Education: N/A   Occupational History  . court reporter    Social History Main Topics  . Smoking status: Never Smoker   . Smokeless tobacco: Never Used  . Alcohol Use: No  . Drug Use: No  . Sexually Active: Not on file   Other Topics Concern  . Not on file   Social History Narrative  . No narrative on file    Current Outpatient Prescriptions on File Prior to Visit  Medication Sig Dispense Refill  . diclofenac sodium (VOLTAREN) 1 % GEL Apply 1 application topically 4 (four) times daily. As needed for pain  1 Tube  1  . esomeprazole (NEXIUM) 40 MG capsule Take 1 capsule (40 mg total) by mouth daily before breakfast.  30 capsule  3  . triamcinolone (KENALOG) 0.025 % cream Apply topically 3 (three) times daily. As needed for rash  30 g  1   Current Facility-Administered Medications on File Prior to Visit  Medication Dose Route Frequency Provider Last Rate Last Dose  . 0.9 %  sodium chloride infusion  500 mL Intravenous Continuous Louis Meckel, MD        No Known Allergies  Family History  Problem Relation Age of Onset  . Ovarian cancer    . Uterine cancer    . Colitis      crohns  . Cancer Mother   . Cancer Father     BP 152/74  Pulse 73  Temp(Src) 98.4 F (36.9 C) (Oral)  Ht 5\' 9"  (1.753 m)  Wt 147 lb (66.679 kg)  BMI 21.71 kg/m2  SpO2 99%  Review of Systems    Respiratory: Negative for shortness of breath.   Cardiovascular: Negative for chest pain.      Objective:   Physical Exam VITAL SIGNS:  See vs page GENERAL: no distress LUNGS:  Clear to auscultation HEART:  Regular rate and rhythm without murmurs noted. Normal S1,S2.      Assessment & Plan:  HTN, with situational component

## 2010-12-28 NOTE — Patient Instructions (Addendum)
i have sent a prescription to your pharmacy, for your blood pressure. Please come back for a blood pressure recheck in approx 1 month.  Please call ahead, so we know to expect you.

## 2011-01-11 ENCOUNTER — Other Ambulatory Visit (HOSPITAL_BASED_OUTPATIENT_CLINIC_OR_DEPARTMENT_OTHER): Payer: Medicare Other | Admitting: Lab

## 2011-01-11 ENCOUNTER — Telehealth: Payer: Self-pay | Admitting: Internal Medicine

## 2011-01-11 ENCOUNTER — Ambulatory Visit (HOSPITAL_BASED_OUTPATIENT_CLINIC_OR_DEPARTMENT_OTHER): Payer: Medicare Other | Admitting: Internal Medicine

## 2011-01-11 VITALS — BP 174/89 | HR 107 | Temp 97.6°F | Ht 67.5 in | Wt 147.2 lb

## 2011-01-11 DIAGNOSIS — D473 Essential (hemorrhagic) thrombocythemia: Secondary | ICD-10-CM

## 2011-01-11 DIAGNOSIS — D509 Iron deficiency anemia, unspecified: Secondary | ICD-10-CM

## 2011-01-11 DIAGNOSIS — D471 Chronic myeloproliferative disease: Secondary | ICD-10-CM

## 2011-01-11 DIAGNOSIS — D47Z9 Other specified neoplasms of uncertain behavior of lymphoid, hematopoietic and related tissue: Secondary | ICD-10-CM

## 2011-01-11 LAB — COMPREHENSIVE METABOLIC PANEL
CO2: 28 mEq/L (ref 19–32)
Calcium: 9.3 mg/dL (ref 8.4–10.5)
Chloride: 104 mEq/L (ref 96–112)
Glucose, Bld: 116 mg/dL — ABNORMAL HIGH (ref 70–99)
Sodium: 140 mEq/L (ref 135–145)
Total Bilirubin: 0.5 mg/dL (ref 0.3–1.2)
Total Protein: 6.4 g/dL (ref 6.0–8.3)

## 2011-01-11 LAB — LACTATE DEHYDROGENASE: LDH: 175 U/L (ref 94–250)

## 2011-01-11 LAB — CBC WITH DIFFERENTIAL/PLATELET
Eosinophils Absolute: 0.1 10*3/uL (ref 0.0–0.5)
HCT: 36.2 % — ABNORMAL LOW (ref 38.4–49.9)
LYMPH%: 20.4 % (ref 14.0–49.0)
MONO#: 0.4 10*3/uL (ref 0.1–0.9)
NEUT#: 4.3 10*3/uL (ref 1.5–6.5)
NEUT%: 71.2 % (ref 39.0–75.0)
Platelets: 1304 10*3/uL — ABNORMAL HIGH (ref 140–400)
RBC: 4.21 10*6/uL (ref 4.20–5.82)
WBC: 6.1 10*3/uL (ref 4.0–10.3)
lymph#: 1.2 10*3/uL (ref 0.9–3.3)

## 2011-01-11 NOTE — Telephone Encounter (Signed)
gve the pt his dec 2012 appt calendar °

## 2011-01-11 NOTE — Progress Notes (Signed)
Called flow cytometry and confirmed  BM asp and BX for 01/12/11 at 0900 . Scheduled in the infusion room and pt notified.

## 2011-01-11 NOTE — Progress Notes (Signed)
[facility] OFFICE PROGRESS NOTE  Thomas Belling, MD, MD [address] [place] [address]  DIAGNOSIS: Thrombocytosis suspicious for essential thrombocythemia.  PRIOR THERAPY: None  CURRENT THERAPY:  1) Hydroxyurea 500 mg by mouth daily in addition to aspirin 81 mg by mouth daily 2) Integra plus 1 capsule by mouth daily.  INTERVAL HISTORY: Thomas Cherry 65 y.o. male returns to the clinic today for followup visit accompanied by his daughter who is currently a pediatric resident at [facility]. The patient is feeling fine today she denied having any specific complaints. He has no chest pain or shortness breath, no cough or hemoptysis. His weight has been stable over the last few weeks. No bleeding issues, no neurological problems or headache. I ordered a molecular study for BCR/ABL and this was also negative. Of note that the JAK-2 mutation studies was also negative. The patient has been on treatment with hydroxyurea 500 mg by mouth daily for the last 2 weeks. He is rating his treatment fairly well with no significant complaints. He has repeat CBC performed earlier today and he is here for evaluation and discussion of his lab results.   MEDICAL HISTORY: Past Medical History  Diagnosis Date  . GERD (gastroesophageal reflux disease)   . Allergy     heyfever    ALLERGIES:   has no known allergies.  MEDICATIONS:  Current Outpatient Prescriptions  Medication Sig Dispense Refill  . acyclovir (ZOVIRAX) 400 MG tablet Take 400 mg by mouth 2 (two) times daily.        . clobetasol cream (TEMOVATE) 0.05 % Apply topically as needed.        . diclofenac sodium (VOLTAREN) 1 % GEL Apply 1 application topically 4 (four) times daily. As needed for pain  1 Tube  1  . esomeprazole (NEXIUM) 40 MG capsule Take 1 capsule (40 mg total) by mouth daily before breakfast.  30 capsule  3  . FeFum-FePoly-FA-B Cmp-C-Biot (INTEGRA PLUS) CAPS Take 1 capsule by mouth daily.        .  hydroxyurea (HYDREA) 500 MG capsule Take 500 mg by mouth daily. May take with food to minimize GI side effects.       Marland Kitchen losartan (COZAAR) 50 MG tablet Take 1 tablet (50 mg total) by mouth daily.  90 tablet  3  . triamcinolone (KENALOG) 0.025 % cream Apply topically 3 (three) times daily. As needed for rash  30 g  1   Current Facility-Administered Medications  Medication Dose Route Frequency Provider Last Rate Last Dose  . 0.9 %  sodium chloride infusion  500 mL Intravenous Continuous Louis Meckel, MD        SURGICAL HISTORY:  Past Surgical History  Procedure Date  . Hernia repair     REVIEW OF SYSTEMS:  A comprehensive review of systems was negative.   PHYSICAL EXAMINATION: General appearance: alert, cooperative and no distress Head: Normocephalic, without obvious abnormality, atraumatic Neck: no adenopathy Lymph nodes: Cervical, supraclavicular, and axillary nodes normal. Resp: clear to auscultation bilaterally Cardio: regular rate and rhythm, S1, S2 normal, no murmur, click, rub or gallop GI: soft, non-tender; bowel sounds normal; no masses,  no organomegaly Extremities: extremities normal, atraumatic, no cyanosis or edema Neurologic: Alert and oriented X 3, normal strength and tone. Normal symmetric reflexes. Normal coordination and gait  ECOG PERFORMANCE STATUS: 0 - Asymptomatic  Blood pressure 174/89, pulse 107, temperature 97.6 F (36.4 C), height 5' 7.5" (1.715 m), weight  147 lb 3.2 oz (66.769 kg).  LABORATORY DATA: Lab Results  Component Value Date   WBC 6.1 01/11/2011   HGB 12.0* 01/11/2011   HCT 36.2* 01/11/2011   MCV 86.0 01/11/2011   PLT 1,304* 01/11/2011      Chemistry      Component Value Date/Time   NA 143 12/14/2010 1124   NA 143 12/14/2010 1124   K 4.6 12/14/2010 1124   K 4.6 12/14/2010 1124   CL 106 12/14/2010 1124   CL 106 12/14/2010 1124   CO2 26 12/14/2010 1124   CO2 26 12/14/2010 1124   BUN 27* 12/14/2010 1124   BUN 27* 12/14/2010 1124    CREATININE 1.17 12/14/2010 1124   CREATININE 1.17 12/14/2010 1124      Component Value Date/Time   CALCIUM 9.4 12/14/2010 1124   CALCIUM 9.4 12/14/2010 1124   ALKPHOS 69 12/14/2010 1124   ALKPHOS 69 12/14/2010 1124   AST 21 12/14/2010 1124   AST 21 12/14/2010 1124   ALT 20 12/14/2010 1124   ALT 20 12/14/2010 1124   BILITOT 0.6 12/14/2010 1124   BILITOT 0.6 12/14/2010 1124     ASSESSMENT: This is a very pleasant 65 years old white male with:  #1 essential thrombocythemia: This is a clinical diagnosis as JAK-2 mutation was negative. The molecular studies for BCR/ABL was also negative but his platelet count continues to be over 1 million, which is highly suspicious for essential thrombocythemia.  #2 mild iron deficiency anemia. I have a lengthy discussion with the patient and his daughter today about his condition. I personally reviewed the peripheral blood smear which showed increased number of platelets, no blasts and other abnormalities.  PLAN:  #1 I would increase his dose of hydroxyurea to 1000 mg by mouth daily with close monitoring of his CBC on a weekly basis and will adjust the dose of hydroxyurea as needed to keep his platelets count less than 500,000.  #2 I would consider the patient for bone marrow biopsy and aspirate to rule out any other myeloproliferative disorder that it could be causing his thrombocythemia. This would be performed tomorrow.  #3 I referred the patient for a second opinion at [facility] with Dr. Berline Lopes who has an interest in myeloproliferative disorder.  #4 I'll discuss his current condition of iron deficiency anemia with his gastroenterologist Dr. Arlyce Dice to see if he needs repeat colonoscopy to rule out GI blood loss.  #5 the patient will continue on Integra plus one capsule by mouth daily for now.  #6 the patient will come back for followup visit in 2 weeks for reevaluation.  The patient and his daughter agreed to the current plan. All questions were  answered. The patient knows to call the clinic with any problems, questions or concerns. We can certainly see the patient much sooner if necessary.

## 2011-01-12 ENCOUNTER — Ambulatory Visit (HOSPITAL_BASED_OUTPATIENT_CLINIC_OR_DEPARTMENT_OTHER): Payer: Medicare Other | Admitting: Internal Medicine

## 2011-01-12 ENCOUNTER — Other Ambulatory Visit (HOSPITAL_COMMUNITY)
Admission: RE | Admit: 2011-01-12 | Discharge: 2011-01-12 | Disposition: A | Discharge status: Home / Self Care | Payer: Medicare Other | Source: Ambulatory Visit | Attending: Internal Medicine | Admitting: Internal Medicine

## 2011-01-12 ENCOUNTER — Other Ambulatory Visit: Payer: Self-pay | Admitting: Internal Medicine

## 2011-01-12 ENCOUNTER — Ambulatory Visit: Payer: Medicare Other

## 2011-01-12 DIAGNOSIS — D473 Essential (hemorrhagic) thrombocythemia: Secondary | ICD-10-CM

## 2011-01-12 NOTE — Progress Notes (Signed)
Bone Marrow Biopsy and Aspiration Procedure Note   Informed consent was obtained and potential risks including bleeding, infection and pain were reviewed with the patient.   Posterior iliac crest(s) prepped with Betadine.   Lidocaine 2% local anesthesia infiltrated into the subcutaneous tissue.  Right bone marrow biopsy and right bone marrow aspirate was obtained.   The procedure was tolerated well and there were no complications.  Specimens sent for: routine histopathologic stains and sectioning, flow cytometry and cytogenetics  Physician: Marilene Vath K. 

## 2011-01-12 NOTE — Patient Instructions (Signed)
Bone Marrow Aspiration, Bone Marrow Biopsy Care After Read the instructions outlined below and refer to this sheet in the next few weeks. These discharge instructions provide you with general information on caring for yourself after you leave the hospital. Your caregiver may also give you specific instructions. While your treatment has been planned according to the most current medical practices available, unavoidable complications occasionally occur. If you have any problems or questions after discharge, call your caregiver. FINDING OUT THE RESULTS OF YOUR TEST Not all test results are available during your visit. If your test results are not back during the visit, make an appointment with your caregiver to find out the results. Do not assume everything is normal if you have not heard from your caregiver or the medical facility. It is important for you to follow up on all of your test results.  HOME CARE INSTRUCTIONS  You have had sedation and may be sleepy or dizzy. Your thinking may not be as clear as usual. For the next 24 hours:  Only take over-the-counter or prescription medicines for pain, discomfort, and or fever as directed by your caregiver.   Do not drink alcohol.   Do not smoke.   Do not drive.   Do not make important legal decisions.   Do not operate heavy machinery.   Do not care for small children by yourself.   Keep your dressing clean and dry. You may replace dressing with a bandage after 24 hours.   You may take a bath or shower after 24 hours.   Use an ice pack for 20 minutes every 2 hours while awake for pain as needed.  SEEK MEDICAL CARE IF:   There is redness, swelling, or increasing pain at the biopsy site.   There is pus coming from the biopsy site.   There is drainage from a biopsy site lasting longer than one day.   An unexplained oral temperature above 102 F (38.9 C) develops.  SEEK IMMEDIATE MEDICAL CARE IF:   You develop a rash.   You have  difficulty breathing.   You develop any reaction or side effects to medications given.  Document Released: 08/18/2004 Document Revised: 10/11/2010 Document Reviewed: 01/27/2008 [facility] Patient Information 2012 [place], [place].

## 2011-01-12 NOTE — Progress Notes (Signed)
1000-Pt discharged to home s/p BM biopsy to right hip.  Dressing clean, dry and intact to right hip.  VS stable.  Discharge instructions given to pt per Dr. Arbutus Ped.  Pt verbalizes an understanding of discharge instructions.

## 2011-01-12 NOTE — Progress Notes (Signed)
Consent obtained for Bone Marrow aspiration and biopsy.

## 2011-01-15 ENCOUNTER — Encounter: Payer: Self-pay | Admitting: Internal Medicine

## 2011-01-15 ENCOUNTER — Ambulatory Visit (INDEPENDENT_AMBULATORY_CARE_PROVIDER_SITE_OTHER): Payer: Medicare Other | Admitting: Internal Medicine

## 2011-01-15 DIAGNOSIS — I10 Essential (primary) hypertension: Secondary | ICD-10-CM

## 2011-01-15 DIAGNOSIS — R002 Palpitations: Secondary | ICD-10-CM

## 2011-01-15 DIAGNOSIS — I4891 Unspecified atrial fibrillation: Secondary | ICD-10-CM

## 2011-01-15 MED ORDER — DILTIAZEM HCL ER COATED BEADS 180 MG PO CP24: 180.0000 mg | ORAL_CAPSULE | Freq: Every day | ORAL | Status: DC

## 2011-01-15 NOTE — Progress Notes (Signed)
  Subjective:    Patient ID: Thomas Cherry, male    DOB: [birth year], 65 y.o.   MRN: [id number]  HPI complains of uncontrolled blood pressure  associated with palpitations, paroxysmal > worse at night Started ARB by PCP 3 weeks ago for same -blood pressure higher, not lower Denies chest pain during spells of palpitation -no neck tightness or shortness of breath + Stimulant use, 5 hour energy drinks on a regular basis to help fatigue Recent medication changes reviewed> now on hydroxyurea for thrombocytosis  Past Medical History  Diagnosis Date  . GERD (gastroesophageal reflux disease)   . Allergy     heyfever    Review of Systems  Respiratory: Negative for chest tightness and shortness of breath.   Cardiovascular: Positive for palpitations. Negative for chest pain and leg swelling.       Objective:   Physical Exam BP 178/84  Pulse 73  Temp(Src) 98.5 F (36.9 C) (Oral)  Wt 148 lb 6.4 oz (67.314 kg)  SpO2 98% Wt Readings from Last 3 Encounters:  01/15/11 148 lb 6.4 oz (67.314 kg)  01/11/11 147 lb 3.2 oz (66.769 kg)  12/28/10 147 lb 1.6 oz (66.724 kg)   Constitutional:  He appears well-developed and well-nourished. No distress.  Neck: Normal range of motion. Neck supple. No JVD present. No thyromegaly present.  Cardiovascular: Variable rate and irrhythm and normal heart sounds.  No murmur heard. no BLE edema Pulmonary/Chest: Effort normal and breath sounds normal. No respiratory distress. no wheezes.   Lab Results  Component Value Date   WBC 6.1 01/11/2011   HGB 12.0* 01/11/2011   HCT 36.2* 01/11/2011   PLT 1,304* 01/11/2011   GLUCOSE 116* 01/11/2011   CHOL 181 11/16/2010   TRIG 98.0 11/16/2010   HDL 56.60 11/16/2010   LDLCALC 105* 11/16/2010   ALT 19 01/11/2011   AST 27 01/11/2011   NA 140 01/11/2011   K 4.4 01/11/2011   CL 104 01/11/2011   CREATININE 1.05 01/11/2011   BUN 23 01/11/2011   CO2 28 01/11/2011   TSH 0.97 11/16/2010   PSA 2.78 11/16/2010   EKG: Sinus  at 72 beats per minute -occasional PVC. No ischemic change    Assessment & Plan:  Hypertension Palpitations ? A. Fib > not evident on EKG today, suspect irregularity due to PACs  Stop losartan and start diltiazem to help blood pressure and palpitation symptoms Avoid stimulants such as his energy drink Refer to cardiology to consider other workup as needed if persisting symptoms No indication for anticoagulation at this time as true irregular rhythm not verified on EKG Followup with primary care physician in next 2 weeks to review medication, blood pressure control and palpitation symptoms -patient to call sooner if problems

## 2011-01-15 NOTE — Patient Instructions (Addendum)
It was good to see you today. Stop losartan and start diltiazem for high blood pressure and palpitations - Your prescription(s) have been submitted to your pharmacy. Please take as directed and contact our office if you believe you are having problem(s) with the medication(s). You should avoid any stimulants such as five-hour energy drink, caffeine or decongestants such as any cold/sinus or congestion medications we'll make referral to cardiology fro additional evaluation of heart palpitations. Our office will contact you regarding appointment(s) once made. Please schedule followup in 2 weeks with Dr. Everardo All for recheck of palpitations and blood pressure  call sooner if problems.

## 2011-01-18 ENCOUNTER — Ambulatory Visit (INDEPENDENT_AMBULATORY_CARE_PROVIDER_SITE_OTHER): Payer: Medicare Other | Admitting: Endocrinology

## 2011-01-18 ENCOUNTER — Encounter: Payer: Self-pay | Admitting: Endocrinology

## 2011-01-18 ENCOUNTER — Other Ambulatory Visit (HOSPITAL_BASED_OUTPATIENT_CLINIC_OR_DEPARTMENT_OTHER): Payer: Medicare Other

## 2011-01-18 ENCOUNTER — Other Ambulatory Visit: Payer: Self-pay | Admitting: *Deleted

## 2011-01-18 VITALS — BP 172/82 | HR 57 | Temp 97.9°F | Ht 67.5 in | Wt 147.0 lb

## 2011-01-18 DIAGNOSIS — I10 Essential (primary) hypertension: Secondary | ICD-10-CM

## 2011-01-18 DIAGNOSIS — D509 Iron deficiency anemia, unspecified: Secondary | ICD-10-CM

## 2011-01-18 DIAGNOSIS — D473 Essential (hemorrhagic) thrombocythemia: Secondary | ICD-10-CM

## 2011-01-18 DIAGNOSIS — R002 Palpitations: Secondary | ICD-10-CM

## 2011-01-18 LAB — CBC WITH DIFFERENTIAL/PLATELET
Basophils Absolute: 0.1 10*3/uL (ref 0.0–0.1)
EOS%: 0.7 % (ref 0.0–7.0)
Eosinophils Absolute: 0 10*3/uL (ref 0.0–0.5)
HGB: 11.6 g/dL — ABNORMAL LOW (ref 13.0–17.1)
LYMPH%: 21.2 % (ref 14.0–49.0)
MCH: 28.3 pg (ref 27.2–33.4)
MCV: 87.6 fL (ref 79.3–98.0)
MONO%: 4.9 % (ref 0.0–14.0)
NEUT#: 4.2 10*3/uL (ref 1.5–6.5)
NEUT%: 72.2 % (ref 39.0–75.0)
Platelets: 892 10*3/uL — ABNORMAL HIGH (ref 140–400)
RDW: 18.3 % — ABNORMAL HIGH (ref 11.0–14.6)

## 2011-01-18 LAB — BASIC METABOLIC PANEL
BUN: 23 mg/dL (ref 6–23)
CO2: 28 mEq/L (ref 19–32)
Calcium: 9.4 mg/dL (ref 8.4–10.5)
Chloride: 105 mEq/L (ref 96–112)
Creatinine, Ser: 0.99 mg/dL (ref 0.50–1.35)
Glucose, Bld: 93 mg/dL (ref 70–99)

## 2011-01-18 MED ORDER — INTEGRA PLUS PO CAPS: 1.0000 | ORAL_CAPSULE | Freq: Every day | ORAL | Status: DC

## 2011-01-18 NOTE — Progress Notes (Signed)
  Subjective:    Patient ID: Thomas Cherry, male    DOB: [birth year], 65 y.o.   MRN: [id number]  HPI The state of at least three ongoing medical problems is addressed today: Palpitations: are much less now.  He has been on cardizem x 3 days.  HTN: bp at home is 138-179 systolic.  He takes meds as rx'ed. Anemia: He takes "integra" for iron supplement.  Denies brbpr.   Past Medical History  Diagnosis Date  . GERD (gastroesophageal reflux disease)   . Allergy     heyfever    Past Surgical History  Procedure Date  . Hernia repair     History   Social History  . Marital Status: Married    Spouse Name: N/A    Number of Children: N/A  . Years of Education: N/A   Occupational History  . court reporter    Social History Main Topics  . Smoking status: Never Smoker   . Smokeless tobacco: Never Used  . Alcohol Use: No  . Drug Use: No  . Sexually Active: Not on file   Other Topics Concern  . Not on file   Social History Narrative  . No narrative on file    Current Outpatient Prescriptions on File Prior to Visit  Medication Sig Dispense Refill  . diltiazem (CARDIZEM CD) 180 MG 24 hr capsule Take 1 capsule (180 mg total) by mouth daily.  30 capsule  1  . esomeprazole (NEXIUM) 40 MG capsule Take 1 capsule (40 mg total) by mouth daily before breakfast.  30 capsule  3  . hydroxyurea (HYDREA) 500 MG capsule Take 500 mg by mouth daily. May take with food to minimize GI side effects.       . triamcinolone (KENALOG) 0.025 % cream Apply topically 3 (three) times daily. As needed for rash  30 g  1  . FeFum-FePoly-FA-B Cmp-C-Biot (INTEGRA PLUS) CAPS Take 1 capsule by mouth daily.  30 capsule  0    No Known Allergies  Family History  Problem Relation Age of Onset  . Ovarian cancer    . Uterine cancer    . Colitis      crohns  . Cancer Mother   . Cancer Father     BP 172/82  Pulse 57  Temp(Src) 97.9 F (36.6 C) (Oral)  Ht 5' 7.5" (1.715 m)  Wt 147 lb (66.679 kg)  BMI  22.68 kg/m2  SpO2 98%      Review of Systems Denies loc and headache.    Objective:   Physical Exam VITAL SIGNS:  See vs page GENERAL: no distress LUNGS:  Clear to auscultation HEART:  Regular rate and rhythm without murmurs noted. Normal S1,S2.     Lab Results  Component Value Date   WBC 5.8 01/18/2011   HGB 11.6* 01/18/2011   HCT 35.9* 01/18/2011   MCV 87.6 01/18/2011   PLT 892* 01/18/2011   Lab Results  Component Value Date   IRON 38* 12/14/2010   IRON 38* 12/14/2010   TIBC 310 12/14/2010   TIBC 310 12/14/2010   FERRITIN 78 12/14/2010      Assessment & Plan:  HTN, now on cardizem Palpitations, uncertain cause fe-deficiency anemia, uncertain etiology

## 2011-01-18 NOTE — Patient Instructions (Addendum)
please see dr Graciela Husbands next week as scheduled. Please also give the diltiazem more time to work.   Please do a 24-hr urine collection for "adrenaline" (rare cause of high-blood pressure). here are some tests for blood in the bowels.  please follow the instructions, and return to the lab downstairs.

## 2011-01-18 NOTE — Telephone Encounter (Signed)
Patient called requesting a refill on Integra.  "Took last capsule today and if Dr. Arbutus Ped wants me to continue this I need a refill."  Per Office note dated 01-12-11 Dr. Arbutus Ped wants patient to continue this for now.  Authorized one refill.  Patient to return 01-25-11.

## 2011-01-19 ENCOUNTER — Other Ambulatory Visit: Payer: Self-pay | Admitting: *Deleted

## 2011-01-19 ENCOUNTER — Encounter: Payer: Self-pay | Admitting: Internal Medicine

## 2011-01-19 NOTE — Progress Notes (Unsigned)
01/18/11 1400-pt here for labs and asking about results from bone marrow. Per Dr. Donnald Garre tell pt to continue what he is taking and give copy of BM report and labs to pt. .reults given to pt and instructed to keep f/u appt. Pt voices understanding.

## 2011-01-19 NOTE — Telephone Encounter (Signed)
INTEGRA PLUS IS ON BACK ORDER. INTEGRA IS AVAILABLE. VERBAL ORDER AND READ BACK TO ADRENA JOHNSON,PA- MAY GIVE INTEGRA. NOTIFIED HEATHER.

## 2011-01-22 ENCOUNTER — Other Ambulatory Visit: Payer: Self-pay | Admitting: *Deleted

## 2011-01-22 ENCOUNTER — Other Ambulatory Visit: Payer: Medicare Other

## 2011-01-22 DIAGNOSIS — I10 Essential (primary) hypertension: Secondary | ICD-10-CM

## 2011-01-22 DIAGNOSIS — D473 Essential (hemorrhagic) thrombocythemia: Secondary | ICD-10-CM

## 2011-01-22 MED ORDER — HYDROXYUREA 500 MG PO CAPS: 500.0000 mg | ORAL_CAPSULE | Freq: Two times a day (BID) | ORAL | Status: DC

## 2011-01-23 ENCOUNTER — Other Ambulatory Visit: Payer: Self-pay | Admitting: Internal Medicine

## 2011-01-25 ENCOUNTER — Ambulatory Visit (INDEPENDENT_AMBULATORY_CARE_PROVIDER_SITE_OTHER): Payer: Medicare Other | Admitting: Internal Medicine

## 2011-01-25 ENCOUNTER — Ambulatory Visit (HOSPITAL_BASED_OUTPATIENT_CLINIC_OR_DEPARTMENT_OTHER): Payer: Medicare Other | Admitting: Internal Medicine

## 2011-01-25 ENCOUNTER — Encounter: Payer: Self-pay | Admitting: Internal Medicine

## 2011-01-25 ENCOUNTER — Other Ambulatory Visit (HOSPITAL_BASED_OUTPATIENT_CLINIC_OR_DEPARTMENT_OTHER): Payer: Medicare Other

## 2011-01-25 VITALS — BP 174/85 | HR 71 | Temp 97.1°F | Ht 69.0 in | Wt 148.0 lb

## 2011-01-25 VITALS — BP 159/86 | HR 70 | Ht 69.0 in | Wt 149.4 lb

## 2011-01-25 DIAGNOSIS — R002 Palpitations: Secondary | ICD-10-CM

## 2011-01-25 DIAGNOSIS — D473 Essential (hemorrhagic) thrombocythemia: Secondary | ICD-10-CM

## 2011-01-25 DIAGNOSIS — D509 Iron deficiency anemia, unspecified: Secondary | ICD-10-CM

## 2011-01-25 DIAGNOSIS — R011 Cardiac murmur, unspecified: Secondary | ICD-10-CM

## 2011-01-25 DIAGNOSIS — I10 Essential (primary) hypertension: Secondary | ICD-10-CM

## 2011-01-25 LAB — BASIC METABOLIC PANEL
CO2: 28 mEq/L (ref 19–32)
Calcium: 9.7 mg/dL (ref 8.4–10.5)
Sodium: 142 mEq/L (ref 135–145)

## 2011-01-25 LAB — CBC WITH DIFFERENTIAL/PLATELET
BASO%: 1 % (ref 0.0–2.0)
LYMPH%: 27.7 % (ref 14.0–49.0)
MCHC: 32.2 g/dL (ref 32.0–36.0)
MONO#: 0.3 10*3/uL (ref 0.1–0.9)
NEUT#: 3.8 10*3/uL (ref 1.5–6.5)
Platelets: 817 10*3/uL — ABNORMAL HIGH (ref 140–400)
RBC: 4.22 10*6/uL (ref 4.20–5.82)
RDW: 19 % — ABNORMAL HIGH (ref 11.0–14.6)
WBC: 5.9 10*3/uL (ref 4.0–10.3)
nRBC: 0 % (ref 0–0)

## 2011-01-25 LAB — LACTATE DEHYDROGENASE: LDH: 166 U/L (ref 94–250)

## 2011-01-25 NOTE — Progress Notes (Signed)
  HPI  Thomas Cherry is a 65 y.o. male Referred for evaluation of palpitations. He apparently is also told that he has atrial fibrillation.  His long-standing history of hypertension which had not been treated. He was started on losartan. He developed a rash. Blood work demonstrated anemia and a platelet count of over 1.5 million. He was referred to hematology. He has undergone an extensive evaluation including bone marrow biopsy the results of which are due to the family today. He has been treated with hydroxyurea.  He also was complaining of palpitations. Because of this his losartan was changed to diltiazem; and no further symptoms.  He has some dyspnea on exertion which has been stable. He denies exertional chest discomfort orthopnea nocturnal dyspnea or peripheral edema Past Medical History  Diagnosis Date  . GERD (gastroesophageal reflux disease)   . Allergy     heyfever  . Hypertension   . Palpitation   . Elevated platelet count     on hydroxyurea    Past Surgical History  Procedure Date  . Hernia repair     Current Outpatient Prescriptions  Medication Sig Dispense Refill  . aspirin 81 MG tablet Take 81 mg by mouth daily.        Marland Kitchen diltiazem (CARDIZEM CD) 180 MG 24 hr capsule Take 1 capsule (180 mg total) by mouth daily.  30 capsule  1  . esomeprazole (NEXIUM) 40 MG capsule Take 40 mg by mouth every other day.        Marland Kitchen FeFum-FePoly-FA-B Cmp-C-Biot (INTEGRA PLUS) CAPS Take 1 capsule by mouth daily.  30 capsule  0  . hydroxyurea (HYDREA) 500 MG capsule Take 1 capsule (500 mg total) by mouth 2 (two) times daily. May take with food to minimize GI side effects.  60 capsule  1  . DISCONTD: esomeprazole (NEXIUM) 40 MG capsule Take 1 capsule (40 mg total) by mouth daily before breakfast.  30 capsule  3  . triamcinolone (KENALOG) 0.025 % cream Apply topically 3 (three) times daily. As needed for rash  30 g  1   Family History  Problem Relation Age of Onset  . Ovarian cancer    .  Uterine cancer    . Colitis      crohns  . Cancer Mother   . Cancer Father   . Transient ischemic attack Sister     Aortic valve replacement    No Known Allergies  Review of Systems negative except from HPI and PMH  Physical Exam Well developed and well nourished Older Caucasian male appearing his stated age in no acute distress HENT normal E scleral and icterus clear Neck Supple JVP flat; carotids brisk and full Clear to ausculation Regular rate and rhythm, no murmurs gallops or rub Soft with active bowel sounds No clubbing cyanosis none Edema Alert and oriented, grossly normal motor and sensory function Skin Warm and Dry Lymph nodes negative Affect is engaging  Electrocardiograms were reviewed. There is no evidence of atrial fibrillation.  ECG today demonstrates sinus rhythm at 70 with intervals of 0.16 5.08/0.40. Is otherwise normal.  Assessment and  Plan

## 2011-01-25 NOTE — Patient Instructions (Addendum)
Your physician recommends that you schedule a follow-up appointment in: PENDING TEST RESULTS Your physician recommends that you continue on your current medications as directed. Please refer to the Current Medication list given to you today. Your physician has recommended that you wear an event monitor. Event monitors are medical devices that record the heart's electrical activity. Doctors most often [place] these monitors to diagnose arrhythmias. Arrhythmias are problems with the speed or rhythm of the heartbeat. The monitor is a small, portable device. You can wear one while you do your normal daily activities. This is usually used to diagnose what is causing palpitations/syncope (passing out). 30  DAY  AF AUTO  DETECT  DX  PALPITATIONS Your physician has requested that you have an echocardiogram. Echocardiography is a painless test that uses sound waves to create images of your heart. It provides your doctor with information about the size and shape of your heart and how well your heart's chambers and valves are working. This procedure takes approximately one hour. There are no restrictions for this procedure.

## 2011-01-25 NOTE — Assessment & Plan Note (Signed)
Continue diltiazem and will defer further management of his blood pressure to Dr. Everardo All. He does not have symptoms of sleep apnea. We will await the echo to look at evidence of endorgan damage

## 2011-01-25 NOTE — Assessment & Plan Note (Signed)
The patient has palpitations and has been told he has atrial fibrillation. I do not see this recorded. The palpitations have been rendered asymptomatic by the diltiazem. Because his CHADS VASC score is 2, it would be important to document atrial fibrillation. To that end we'll give him an event recorder.  He also has modest dyspnea on exertion and a heart murmur. We will plan to undertake a echo cardiogram

## 2011-01-26 LAB — CATECHOLAMINES, FRACTIONATED, URINE, 24 HOUR
Calculated Total (E+NE): 29 mcg/24 h (ref 26–121)
Creatinine, Urine mg/day-CATEUR: 1.56 g/(24.h) (ref 0.63–2.50)
Dopamine, 24 hr Urine: 102 mcg/24 h (ref 52–480)
Norepinephrine, 24 hr Ur: 20 mcg/24 h (ref 15–100)

## 2011-01-26 LAB — METANEPHRINES, URINE, 24 HOUR: Metaneph Total, Ur: 410 mcg/24 h (ref 224–832)

## 2011-01-26 NOTE — Progress Notes (Signed)
No images are attached to the encounter. No scans are attached to the encounter. No scans are attached to the encounter. [facility] OFFICE PROGRESS NOTE  [name], MD, MD [address] [place] [address]  DIAGNOSIS: Essential thrombocythemia.  PRIOR THERAPY: None  CURRENT THERAPY:  1) Hydroxyurea 1000 mg by mouth daily. 2) Integra plus 1 capsule by mouth daily for iron deficiency. 3) aspirin 81 mg by mouth daily.   INTERVAL HISTORY: NIRAV SWEDA 65 y.o. male returns to the clinic today for followup visit accompanied by his wife. The patient has no complaints today he is feeling. He is tolerating his treatment with hydroxyurea fairly well. He denied having any significant nausea or vomiting, no weight loss or night sweats, no chest pain or shortness breath. He has no bleeding issues. His blood pressure was elevated today and the patient is currently on diltiazem by his primary care physician. The patient has a bone marrow biopsy and aspirate performed recently and he is here today for evaluation and discussion of his biopsy results.  MEDICAL HISTORY: Past Medical History  Diagnosis Date  . GERD (gastroesophageal reflux disease)   . Allergy     heyfever  . Hypertension   . Palpitation   . Elevated platelet count     on hydroxyurea    ALLERGIES:   has no known allergies.  MEDICATIONS:  Current Outpatient Prescriptions  Medication Sig Dispense Refill  . aspirin 81 MG tablet Take 81 mg by mouth daily.        Marland Kitchen diltiazem (CARDIZEM CD) 180 MG 24 hr capsule Take 1 capsule (180 mg total) by mouth daily.  30 capsule  1  . esomeprazole (NEXIUM) 40 MG capsule Take 40 mg by mouth every other day.        Marland Kitchen FeFum-FePoly-FA-B Cmp-C-Biot (INTEGRA PLUS) CAPS Take 1 capsule by mouth daily.  30 capsule  0  . hydroxyurea (HYDREA) 500 MG capsule Take 1 capsule (500 mg total) by mouth 2 (two) times daily. May take with food to minimize GI side effects.  60  capsule  1  . triamcinolone (KENALOG) 0.025 % cream Apply topically 3 (three) times daily. As needed for rash  30 g  1    SURGICAL HISTORY:  Past Surgical History  Procedure Date  . Hernia repair     REVIEW OF SYSTEMS:  A comprehensive review of systems was negative.   PHYSICAL EXAMINATION: General appearance: alert, cooperative and no distress Head: Normocephalic, without obvious abnormality, atraumatic Neck: no adenopathy Lymph nodes: Cervical, supraclavicular, and axillary nodes normal. Resp: clear to auscultation bilaterally Cardio: regular rate and rhythm, S1, S2 normal, no murmur, click, rub or gallop GI: soft, non-tender; bowel sounds normal; no masses,  no organomegaly Extremities: extremities normal, atraumatic, no cyanosis or edema Neurologic: Alert and oriented X 3, normal strength and tone. Normal symmetric reflexes. Normal coordination and gait  ECOG PERFORMANCE STATUS: 0 - Asymptomatic  Blood pressure 174/85, pulse 71, temperature 97.1 F (36.2 C), temperature source Oral, height 5\' 9"  (1.753 m), weight 148 lb (67.132 kg).  LABORATORY DATA: Lab Results  Component Value Date   WBC 5.9 01/25/2011   HGB 12.0* 01/25/2011   HCT 37.3* 01/25/2011   MCV 88.4 01/25/2011   PLT 817* 01/25/2011      Chemistry      Component Value Date/Time   NA 142 01/25/2011 0820   K 4.2 01/25/2011 0820   CL 105 01/25/2011 0820  CO2 28 01/25/2011 0820   BUN 25* 01/25/2011 0820   CREATININE 0.97 01/25/2011 0820      Component Value Date/Time   CALCIUM 9.7 01/25/2011 0820   ALKPHOS 61 01/11/2011 1001   AST 27 01/11/2011 1001   ALT 19 01/11/2011 1001   BILITOT 0.5 01/11/2011 1001     BONE MARROW REPORT FINAL DIAGNOSIS Diagnosis Bone Marrow, Aspirate,Biopsy, and Clot, right superior posterior iliac crest - HYPERCELLULAR MARROW WITH INCREASED MEGAKARYOCYTES. SEE COMMENT. PERIPHERAL BLOOD: - NORMOCYTIC ANEMIA WITH MILD ANISOCYTOSIS. Diagnosis Note The marrow is  hypercellular and shows increased numbers of megakaryocytes with hyperlobated nuclei. Essential thrombocythemia is a primary differential diagnostic consideration. Cytogenetics is suggested to rule out a myeloproliferative disorder. There is no increased reticulin or collagen fibrosis with reticulin stain or trichrome stain. (JDP:mw 01-16-11) Jimmy Picket MD Pathologist, Electronic Signature (Case signed 01/16/2011)  ASSESSMENT: This is a pleasant 65 years old white male with essential thrombocythemia, currently on hydroxyurea. The patient is tolerating his treatment well and there is a gradual decline in his platelet count. His bone marrow biopsy and aspirate showed no other abnormalities except for the essential thrombocythemia. I discussed the biopsy results with the patient and his wife. He is scheduled to see Dr. Malen Gauze at [facility] next week.   PLAN: I would consider the patient on the same treatment for now with hydroxyurea 1000 mg by mouth daily.  I will continue to check his CBC on by weekly basis. I would see the patient back for followup visit in 4 weeks for reevaluation. I will continue to adjust his hydroxyurea dose on as needed basis to keep his platelets count this in 500,000. The patient agreed to the current plan. I also discussed with Dr. Arlyce Dice the need for a repeat colonoscopy for this patient especially with his history of iron deficiency anemia.   All questions were answered. The patient knows to call the clinic with any problems, questions or concerns. We can certainly see the patient much sooner if necessary.

## 2011-01-30 ENCOUNTER — Ambulatory Visit (HOSPITAL_COMMUNITY): Payer: Medicare Other | Attending: Cardiology | Admitting: Radiology

## 2011-01-30 ENCOUNTER — Encounter (INDEPENDENT_AMBULATORY_CARE_PROVIDER_SITE_OTHER): Payer: Medicare Other

## 2011-01-30 DIAGNOSIS — I359 Nonrheumatic aortic valve disorder, unspecified: Secondary | ICD-10-CM | POA: Insufficient documentation

## 2011-01-30 DIAGNOSIS — R0989 Other specified symptoms and signs involving the circulatory and respiratory systems: Secondary | ICD-10-CM | POA: Insufficient documentation

## 2011-01-30 DIAGNOSIS — I10 Essential (primary) hypertension: Secondary | ICD-10-CM | POA: Insufficient documentation

## 2011-01-30 DIAGNOSIS — R002 Palpitations: Secondary | ICD-10-CM

## 2011-01-30 DIAGNOSIS — R011 Cardiac murmur, unspecified: Secondary | ICD-10-CM | POA: Insufficient documentation

## 2011-01-30 DIAGNOSIS — R0609 Other forms of dyspnea: Secondary | ICD-10-CM | POA: Insufficient documentation

## 2011-01-30 DIAGNOSIS — I379 Nonrheumatic pulmonary valve disorder, unspecified: Secondary | ICD-10-CM | POA: Insufficient documentation

## 2011-02-01 ENCOUNTER — Other Ambulatory Visit: Payer: Medicare Other

## 2011-02-05 ENCOUNTER — Other Ambulatory Visit: Payer: Self-pay | Admitting: Endocrinology

## 2011-02-05 ENCOUNTER — Other Ambulatory Visit: Payer: Medicare Other

## 2011-02-05 DIAGNOSIS — Z1211 Encounter for screening for malignant neoplasm of colon: Secondary | ICD-10-CM

## 2011-02-05 LAB — HEMOCCULT SLIDES (X 3 CARDS)
OCCULT 1: NEGATIVE
OCCULT 2: NEGATIVE
OCCULT 3: NEGATIVE
OCCULT 4: NEGATIVE

## 2011-02-08 ENCOUNTER — Other Ambulatory Visit (HOSPITAL_BASED_OUTPATIENT_CLINIC_OR_DEPARTMENT_OTHER): Payer: Medicare Other | Admitting: Lab

## 2011-02-08 DIAGNOSIS — D473 Essential (hemorrhagic) thrombocythemia: Secondary | ICD-10-CM

## 2011-02-08 LAB — CBC WITH DIFFERENTIAL/PLATELET
BASO%: 0.6 % (ref 0.0–2.0)
EOS%: 0.7 % (ref 0.0–7.0)
LYMPH%: 24.1 % (ref 14.0–49.0)
MCH: 30.5 pg (ref 27.2–33.4)
MCHC: 33.8 g/dL (ref 32.0–36.0)
MCV: 90.3 fL (ref 79.3–98.0)
MONO%: 4.5 % (ref 0.0–14.0)
Platelets: 706 10*3/uL — ABNORMAL HIGH (ref 140–400)
RBC: 3.61 10*6/uL — ABNORMAL LOW (ref 4.20–5.82)
WBC: 3.5 10*3/uL — ABNORMAL LOW (ref 4.0–10.3)

## 2011-02-08 LAB — BASIC METABOLIC PANEL
BUN: 27 mg/dL — ABNORMAL HIGH (ref 6–23)
Calcium: 9.1 mg/dL (ref 8.4–10.5)
Creatinine, Ser: 1.01 mg/dL (ref 0.50–1.35)

## 2011-02-08 LAB — LACTATE DEHYDROGENASE: LDH: 153 U/L (ref 94–250)

## 2011-02-15 ENCOUNTER — Other Ambulatory Visit (HOSPITAL_BASED_OUTPATIENT_CLINIC_OR_DEPARTMENT_OTHER): Payer: Medicare Other

## 2011-02-15 DIAGNOSIS — D473 Essential (hemorrhagic) thrombocythemia: Secondary | ICD-10-CM

## 2011-02-15 LAB — CBC WITH DIFFERENTIAL/PLATELET
Basophils Absolute: 0 10*3/uL (ref 0.0–0.1)
Eosinophils Absolute: 0 10*3/uL (ref 0.0–0.5)
HCT: 31.6 % — ABNORMAL LOW (ref 38.4–49.9)
LYMPH%: 27.2 % (ref 14.0–49.0)
MCV: 91.7 fL (ref 79.3–98.0)
MONO#: 0.1 10*3/uL (ref 0.1–0.9)
MONO%: 3.8 % (ref 0.0–14.0)
NEUT#: 2.1 10*3/uL (ref 1.5–6.5)
NEUT%: 68.2 % (ref 39.0–75.0)
Platelets: 487 10*3/uL — ABNORMAL HIGH (ref 140–400)
WBC: 3.2 10*3/uL — ABNORMAL LOW (ref 4.0–10.3)

## 2011-02-15 LAB — BASIC METABOLIC PANEL
BUN: 23 mg/dL (ref 6–23)
CO2: 29 mEq/L (ref 19–32)
Calcium: 8.9 mg/dL (ref 8.4–10.5)
Creatinine, Ser: 1.08 mg/dL (ref 0.50–1.35)
Glucose, Bld: 91 mg/dL (ref 70–99)

## 2011-02-22 ENCOUNTER — Telehealth: Payer: Self-pay | Admitting: Internal Medicine

## 2011-02-22 ENCOUNTER — Other Ambulatory Visit (HOSPITAL_BASED_OUTPATIENT_CLINIC_OR_DEPARTMENT_OTHER): Payer: Medicare Other

## 2011-02-22 ENCOUNTER — Ambulatory Visit: Payer: Medicare Other | Admitting: Internal Medicine

## 2011-02-22 DIAGNOSIS — D473 Essential (hemorrhagic) thrombocythemia: Secondary | ICD-10-CM

## 2011-02-22 LAB — CBC WITH DIFFERENTIAL/PLATELET
BASO%: 0.7 % (ref 0.0–2.0)
Basophils Absolute: 0 10e3/uL (ref 0.0–0.1)
EOS%: 1.1 % (ref 0.0–7.0)
Eosinophils Absolute: 0 10e3/uL (ref 0.0–0.5)
HCT: 32.3 % — ABNORMAL LOW (ref 38.4–49.9)
HGB: 10.4 g/dL — ABNORMAL LOW (ref 13.0–17.1)
LYMPH%: 29.8 % (ref 14.0–49.0)
MCH: 29.8 pg (ref 27.2–33.4)
MCHC: 32.2 g/dL (ref 32.0–36.0)
MCV: 92.6 fL (ref 79.3–98.0)
MONO#: 0.2 10e3/uL (ref 0.1–0.9)
MONO%: 5.5 % (ref 0.0–14.0)
NEUT#: 1.7 10e3/uL (ref 1.5–6.5)
NEUT%: 62.9 % (ref 39.0–75.0)
Platelets: 379 10e3/uL (ref 140–400)
RBC: 3.49 10e6/uL — ABNORMAL LOW (ref 4.20–5.82)
RDW: 21.9 % — ABNORMAL HIGH (ref 11.0–14.6)
WBC: 2.7 10e3/uL — ABNORMAL LOW (ref 4.0–10.3)
lymph#: 0.8 10e3/uL — ABNORMAL LOW (ref 0.9–3.3)
nRBC: 0 % (ref 0–0)

## 2011-02-22 LAB — LACTATE DEHYDROGENASE: LDH: 126 U/L (ref 94–250)

## 2011-02-22 NOTE — Telephone Encounter (Signed)
gve the pt his jan,feb 2013 appt calendar °

## 2011-02-23 NOTE — Progress Notes (Signed)
[facility] OFFICE PROGRESS NOTE  [name], MD, MD [address] [place] [address]  DIAGNOSIS: Essential thrombocythemia.   PRIOR THERAPY: None   CURRENT THERAPY:  1) Hydroxyurea 1500 mg by mouth daily.  2) Integra plus 1 capsule by mouth daily for iron deficiency.  3) aspirin 81 mg by mouth daily.    INTERVAL HISTORY: Thomas Cherry 66 y.o. male returns to the clinic today for routine followup visit. The patient was seen recently by Dr. Mikki Santee at [facility] for a second opinion. He was accompanied and but continues his dose of hydroxyurea to 1500 mg by mouth daily. The patient is tolerating his treatment fairly well with no significant complaints except for occasional mild nosebleed. He denied having any bruises or ecchymosis or any other bleeding disorders. No significant weight loss or night sweats. No fever or chills. He also mentions occasional nausea. He has repeat CBC performed earlier today and he is here for evaluation and discussion of his lab results.  MEDICAL HISTORY: Past Medical History  Diagnosis Date  . GERD (gastroesophageal reflux disease)   . Allergy     heyfever  . Hypertension   . Palpitation   . Elevated platelet count     on hydroxyurea    ALLERGIES:   has no known allergies.  MEDICATIONS:  Current Outpatient Prescriptions  Medication Sig Dispense Refill  . aspirin 81 MG tablet Take 81 mg by mouth daily.        Marland Kitchen diltiazem (CARDIZEM CD) 180 MG 24 hr capsule Take 1 capsule (180 mg total) by mouth daily.  30 capsule  1  . esomeprazole (NEXIUM) 40 MG capsule Take 40 mg by mouth every other day.        Marland Kitchen FeFum-FePoly-FA-B Cmp-C-Biot (INTEGRA PLUS) CAPS Take 1 capsule by mouth daily.  30 capsule  0  . hydroxyurea (HYDREA) 500 MG capsule Take 500 mg by mouth 2 (two) times daily. Dec 19th started 500 mg in am and 1000 mg in pm per Dr. Benita Gutter. May take with food to minimize GI side effects.      .  triamcinolone (KENALOG) 0.025 % cream Apply topically 3 (three) times daily. As needed for rash  30 g  1    SURGICAL HISTORY:  Past Surgical History  Procedure Date  . Hernia repair     REVIEW OF SYSTEMS:  A comprehensive review of systems was negative except for: Constitutional: positive for fatigue Gastrointestinal: positive for nausea   PHYSICAL EXAMINATION: General appearance: alert, cooperative and no distress Head: Normocephalic, without obvious abnormality, atraumatic Neck: no adenopathy Lymph nodes: Cervical, supraclavicular, and axillary nodes normal. Resp: clear to auscultation bilaterally Cardio: regular rate and rhythm, S1, S2 normal, no murmur, click, rub or gallop GI: soft, non-tender; bowel sounds normal; no masses,  no organomegaly Extremities: extremities normal, atraumatic, no cyanosis or edema Neurologic: Alert and oriented X 3, normal strength and tone. Normal symmetric reflexes. Normal coordination and gait  ECOG PERFORMANCE STATUS: 0 - Asymptomatic  Blood pressure 149/69, pulse 78, temperature 97.6 F (36.4 C), temperature source Oral, height 5\' 9"  (1.753 m), weight 150 lb 6.4 oz (68.221 kg).  LABORATORY DATA: Lab Results  Component Value Date   WBC 2.7* 02/22/2011   HGB 10.4* 02/22/2011   HCT 32.3* 02/22/2011   MCV 92.6 02/22/2011   PLT 379 02/22/2011      Chemistry      Component Value Date/Time   NA  140 02/15/2011 1307   K 4.4 02/15/2011 1307   CL 106 02/15/2011 1307   CO2 29 02/15/2011 1307   BUN 23 02/15/2011 1307   CREATININE 1.08 02/15/2011 1307      Component Value Date/Time   CALCIUM 8.9 02/15/2011 1307   ALKPHOS 61 01/11/2011 1001   AST 27 01/11/2011 1001   ALT 19 01/11/2011 1001   BILITOT 0.5 01/11/2011 1001       RADIOGRAPHIC STUDIES: No results found.  ASSESSMENT: This is a very pleasant 66 years old white male with essential thrombocythemia currently on hydroxyurea 1500 mg by mouth daily. Patient is tolerating his treatment fairly well and  he has significant improvement in his platelet counts, but he also continues to have throat and his total white blood count and hemoglobin. I discussed the lab result with the patient   PLAN: I recommend for him to decrease his hydroxyurea dose to 1000 mg by mouth daily to avoid further decline in his leukocyte count. I would continue to monitor his CBC on a weekly basis. The patient would come back for followup visit in one month for reevaluation. He agreed to the current plan.  All questions were answered. The patient knows to call the clinic with any problems, questions or concerns. We can certainly see the patient much sooner if necessary.

## 2011-03-02 ENCOUNTER — Other Ambulatory Visit: Payer: Self-pay | Admitting: Internal Medicine

## 2011-03-06 ENCOUNTER — Other Ambulatory Visit: Payer: Self-pay | Admitting: Internal Medicine

## 2011-03-06 ENCOUNTER — Encounter: Payer: Self-pay | Admitting: Internal Medicine

## 2011-03-06 DIAGNOSIS — D473 Essential (hemorrhagic) thrombocythemia: Secondary | ICD-10-CM

## 2011-03-06 MED ORDER — INTEGRA PLUS PO CAPS: 1.0000 | ORAL_CAPSULE | Freq: Every day | ORAL | Status: DC

## 2011-03-08 ENCOUNTER — Other Ambulatory Visit (HOSPITAL_BASED_OUTPATIENT_CLINIC_OR_DEPARTMENT_OTHER): Payer: Medicare Other | Admitting: Lab

## 2011-03-08 ENCOUNTER — Telehealth: Payer: Self-pay

## 2011-03-08 DIAGNOSIS — D473 Essential (hemorrhagic) thrombocythemia: Secondary | ICD-10-CM

## 2011-03-08 LAB — CBC WITH DIFFERENTIAL/PLATELET
BASO%: 0.4 % (ref 0.0–2.0)
HCT: 32.5 % — ABNORMAL LOW (ref 38.4–49.9)
MCHC: 34.1 g/dL (ref 32.0–36.0)
MONO#: 0.2 10*3/uL (ref 0.1–0.9)
NEUT%: 63.9 % (ref 39.0–75.0)
RBC: 3.35 10*6/uL — ABNORMAL LOW (ref 4.20–5.82)
RDW: 27.6 % — ABNORMAL HIGH (ref 11.0–14.6)
WBC: 3.2 10*3/uL — ABNORMAL LOW (ref 4.0–10.3)
lymph#: 0.9 10*3/uL (ref 0.9–3.3)

## 2011-03-08 LAB — COMPREHENSIVE METABOLIC PANEL
ALT: 13 U/L (ref 0–53)
AST: 19 U/L (ref 0–37)
Albumin: 4.1 g/dL (ref 3.5–5.2)
CO2: 28 mEq/L (ref 19–32)
Calcium: 9 mg/dL (ref 8.4–10.5)
Chloride: 106 mEq/L (ref 96–112)
Potassium: 4.3 mEq/L (ref 3.5–5.3)
Sodium: 142 mEq/L (ref 135–145)
Total Protein: 6.1 g/dL (ref 6.0–8.3)

## 2011-03-08 LAB — LACTATE DEHYDROGENASE: LDH: 141 U/L (ref 94–250)

## 2011-03-08 NOTE — Telephone Encounter (Signed)
Pt scheduled for previsit 03/30/11@4pm , colon scheduled for 04/12/11@9 :30am. Pt aware of appt dates and times.

## 2011-03-08 NOTE — Telephone Encounter (Signed)
Message copied by Michele Mcalpine on Thu Mar 08, 2011  3:29 PM ------      Message from: Melvia Heaps D      Created: Thu Mar 08, 2011  8:33 AM       Bonita Quin,      This pt needs a colonoscopy.  Please arrange for this.  He has an iron deficient anemia.

## 2011-03-09 ENCOUNTER — Telehealth: Payer: Self-pay | Admitting: Internal Medicine

## 2011-03-09 NOTE — Telephone Encounter (Addendum)
Asking about labs, specifically platelet count. Does he need to adjust his Hydrea -he is taking 500 mg BID. I told him I will consult with Dr Donnald Garre.  Per Dr Donnald Garre order  I told pt to alternate  Hydrea dose 1500mg  a day with 1000 mg the other day. Pt voices understanding.

## 2011-03-16 ENCOUNTER — Encounter: Payer: Self-pay | Admitting: Gastroenterology

## 2011-03-16 ENCOUNTER — Other Ambulatory Visit: Payer: Self-pay | Admitting: Internal Medicine

## 2011-03-21 ENCOUNTER — Encounter: Payer: Self-pay | Admitting: *Deleted

## 2011-03-22 ENCOUNTER — Other Ambulatory Visit (HOSPITAL_BASED_OUTPATIENT_CLINIC_OR_DEPARTMENT_OTHER): Payer: Medicare Other | Admitting: Lab

## 2011-03-22 ENCOUNTER — Ambulatory Visit (HOSPITAL_BASED_OUTPATIENT_CLINIC_OR_DEPARTMENT_OTHER): Payer: Medicare Other | Admitting: Internal Medicine

## 2011-03-22 VITALS — BP 138/74 | HR 78 | Temp 97.6°F | Ht 69.0 in | Wt 149.0 lb

## 2011-03-22 DIAGNOSIS — D649 Anemia, unspecified: Secondary | ICD-10-CM

## 2011-03-22 DIAGNOSIS — D473 Essential (hemorrhagic) thrombocythemia: Secondary | ICD-10-CM

## 2011-03-22 LAB — COMPREHENSIVE METABOLIC PANEL
AST: 23 U/L (ref 0–37)
Albumin: 4.1 g/dL (ref 3.5–5.2)
Alkaline Phosphatase: 58 U/L (ref 39–117)
BUN: 27 mg/dL — ABNORMAL HIGH (ref 6–23)
Glucose, Bld: 94 mg/dL (ref 70–99)
Potassium: 4.2 mEq/L (ref 3.5–5.3)
Sodium: 141 mEq/L (ref 135–145)
Total Bilirubin: 0.5 mg/dL (ref 0.3–1.2)
Total Protein: 6.3 g/dL (ref 6.0–8.3)

## 2011-03-22 LAB — CBC WITH DIFFERENTIAL/PLATELET
EOS%: 0.4 % (ref 0.0–7.0)
LYMPH%: 26.1 % (ref 14.0–49.0)
MCH: 34.2 pg — ABNORMAL HIGH (ref 27.2–33.4)
MCV: 100.6 fL — ABNORMAL HIGH (ref 79.3–98.0)
MONO%: 4.5 % (ref 0.0–14.0)
Platelets: 558 10*3/uL — ABNORMAL HIGH (ref 140–400)
RBC: 3.3 10*6/uL — ABNORMAL LOW (ref 4.20–5.82)
RDW: 27.5 % — ABNORMAL HIGH (ref 11.0–14.6)

## 2011-03-23 NOTE — Progress Notes (Signed)
[facility] OFFICE PROGRESS NOTE  [name], MD, MD [address] [facility] [address] [facility] 4th Floor [place] [address]  DIAGNOSIS: Essential thrombocythemia.   PRIOR THERAPY: None   CURRENT THERAPY:  1) Hydroxyurea 1000 mg alternating with 1500 mg by mouth every other day.  2) Integra plus 1 capsule by mouth daily for iron deficiency.  3) aspirin 81 mg by mouth daily.    INTERVAL HISTORY: Thomas Cherry 66 y.o. male returns to the clinic today for one-month followup visit. The patient is tolerating his treatment with Hydrea fairly well except for occasional nausea. He has no bleeding issues. No fever or chills. No weight loss or night sweats. He has repeat CBC performed earlier today and he is here for evaluation and discussion of his lab results.  MEDICAL HISTORY: Past Medical History  Diagnosis Date  . GERD (gastroesophageal reflux disease)   . Allergy     heyfever  . Hypertension   . Palpitation   . Elevated platelet count     on hydroxyurea    ALLERGIES:   has no known allergies.  MEDICATIONS:  Current Outpatient Prescriptions  Medication Sig Dispense Refill  . aspirin 81 MG tablet Take 81 mg by mouth daily.        Marland Kitchen diltiazem (CARDIZEM CD) 180 MG 24 hr capsule TAKE ONE CAPSULE BY MOUTH DAILY  30 capsule  5  . esomeprazole (NEXIUM) 40 MG capsule Take 40 mg by mouth every other day.        Marland Kitchen FeFum-FePoly-FA-B Cmp-C-Biot (INTEGRA PLUS) CAPS Take 1 capsule by mouth daily.  30 capsule  0  . hydroxyurea (HYDREA) 500 MG capsule Take 500 mg by mouth daily. May take with food to minimize GI side effects.take 2 tablets alternate with 3 tablets      . triamcinolone (KENALOG) 0.025 % cream Apply topically 3 (three) times daily. As needed for rash  30 g  1    SURGICAL HISTORY:  Past Surgical History  Procedure Date  . Hernia repair     REVIEW OF SYSTEMS:  A comprehensive review of systems was negative except for: Gastrointestinal: positive for nausea   PHYSICAL  EXAMINATION: General appearance: alert, cooperative and no distress Neck: no adenopathy Lymph nodes: Cervical, supraclavicular, and axillary nodes normal. Resp: clear to auscultation bilaterally Cardio: regular rate and rhythm, S1, S2 normal, no murmur, click, rub or gallop GI: soft, non-tender; bowel sounds normal; no masses,  no organomegaly Extremities: extremities normal, atraumatic, no cyanosis or edema Neurologic: Alert and oriented X 3, normal strength and tone. Normal symmetric reflexes. Normal coordination and gait  ECOG PERFORMANCE STATUS: 0 - Asymptomatic  Blood pressure 138/74, pulse 78, temperature 97.6 F (36.4 C), temperature source Oral, height 5\' 9"  (1.753 m), weight 149 lb (67.586 kg).  LABORATORY DATA: Lab Results  Component Value Date   WBC 3.1* 03/22/2011   HGB 11.3* 03/22/2011   HCT 33.2* 03/22/2011   MCV 100.6* 03/22/2011   PLT 558* 03/22/2011      Chemistry      Component Value Date/Time   NA 141 03/22/2011 1153   K 4.2 03/22/2011 1153   CL 106 03/22/2011 1153   CO2 27 03/22/2011 1153   BUN 27* 03/22/2011 1153   CREATININE 1.07 03/22/2011 1153      Component Value Date/Time   CALCIUM 8.9 03/22/2011 1153   ALKPHOS 58 03/22/2011 1153   AST 23 03/22/2011 1153   ALT 17 03/22/2011 1153  BILITOT 0.5 03/22/2011 1153       RADIOGRAPHIC STUDIES: No results found.  ASSESSMENT: This is a very pleasant 66 years old white male with essential thrombocythemia currently on treatment with hydroxyurea 1000 mg alternating with 1500 mg every other day. The patient is tolerating his treatment well but his platelet count continues to be above 500,000.  PLAN: I discussed the lab result with the patient and recommended for him to increase his hydroxyurea to 1500 mg on daily basis. I will continue to monitor his platelets count on biweekly basis. The patient come back for followup visit in one month's for reevaluation and the adjustment of his hydroxyurea dose. He'll continue on Integra plus capsule  for iron deficiency anemia   All questions were answered. The patient knows to call the clinic with any problems, questions or concerns. We can certainly see the patient much sooner if necessary.

## 2011-03-27 ENCOUNTER — Other Ambulatory Visit: Payer: Self-pay | Admitting: Internal Medicine

## 2011-03-27 DIAGNOSIS — D473 Essential (hemorrhagic) thrombocythemia: Secondary | ICD-10-CM

## 2011-03-30 ENCOUNTER — Ambulatory Visit (AMBULATORY_SURGERY_CENTER): Payer: Medicare Other | Admitting: *Deleted

## 2011-03-30 VITALS — Ht 69.0 in | Wt 150.0 lb

## 2011-03-30 DIAGNOSIS — Z1211 Encounter for screening for malignant neoplasm of colon: Secondary | ICD-10-CM

## 2011-03-30 DIAGNOSIS — Z86010 Personal history of colonic polyps: Secondary | ICD-10-CM

## 2011-03-30 MED ORDER — PEG-KCL-NACL-NASULF-NA ASC-C 100 G PO SOLR: ORAL | Status: DC

## 2011-04-05 ENCOUNTER — Other Ambulatory Visit (HOSPITAL_BASED_OUTPATIENT_CLINIC_OR_DEPARTMENT_OTHER): Payer: Medicare Other | Admitting: Lab

## 2011-04-05 DIAGNOSIS — D473 Essential (hemorrhagic) thrombocythemia: Secondary | ICD-10-CM

## 2011-04-05 LAB — CBC WITH DIFFERENTIAL/PLATELET
BASO%: 0.4 % (ref 0.0–2.0)
Basophils Absolute: 0 10*3/uL (ref 0.0–0.1)
EOS%: 0.7 % (ref 0.0–7.0)
HCT: 30.4 % — ABNORMAL LOW (ref 38.4–49.9)
HGB: 10.5 g/dL — ABNORMAL LOW (ref 13.0–17.1)
LYMPH%: 28.2 % (ref 14.0–49.0)
MCH: 35.9 pg — ABNORMAL HIGH (ref 27.2–33.4)
MCHC: 34.5 g/dL (ref 32.0–36.0)
MONO#: 0.1 10*3/uL (ref 0.1–0.9)
NEUT%: 65.8 % (ref 39.0–75.0)
Platelets: 437 10*3/uL — ABNORMAL HIGH (ref 140–400)
lymph#: 0.8 10*3/uL — ABNORMAL LOW (ref 0.9–3.3)

## 2011-04-05 LAB — COMPREHENSIVE METABOLIC PANEL
ALT: 15 U/L (ref 0–53)
BUN: 23 mg/dL (ref 6–23)
CO2: 28 mEq/L (ref 19–32)
Calcium: 8.8 mg/dL (ref 8.4–10.5)
Chloride: 108 mEq/L (ref 96–112)
Creatinine, Ser: 0.99 mg/dL (ref 0.50–1.35)
Total Bilirubin: 0.4 mg/dL (ref 0.3–1.2)

## 2011-04-05 LAB — LACTATE DEHYDROGENASE: LDH: 141 U/L (ref 94–250)

## 2011-04-09 ENCOUNTER — Telehealth: Payer: Self-pay | Admitting: Internal Medicine

## 2011-04-09 NOTE — Telephone Encounter (Signed)
Will see what his next lab looks like on the current dose. He can stay on the current dose for now.

## 2011-04-09 NOTE — Telephone Encounter (Signed)
Currently he is taking hydrea 1 am and 2 hs . He is concerned that his WBC went down  and may go down further on current med schedule. So he wants to know if he can take 3 days of 3 tablets and 1 day of 2 tablets . I told him I will consult with Dr Donnald Garre and call him back. Next lab is 04/19/11

## 2011-04-10 NOTE — Telephone Encounter (Signed)
Left message on pts voice mail regarding Dr Donnald Garre response.

## 2011-04-12 ENCOUNTER — Encounter: Payer: Self-pay | Admitting: Gastroenterology

## 2011-04-12 ENCOUNTER — Ambulatory Visit (AMBULATORY_SURGERY_CENTER): Payer: Medicare Other | Admitting: Gastroenterology

## 2011-04-12 DIAGNOSIS — K5730 Diverticulosis of large intestine without perforation or abscess without bleeding: Secondary | ICD-10-CM

## 2011-04-12 DIAGNOSIS — D509 Iron deficiency anemia, unspecified: Secondary | ICD-10-CM

## 2011-04-12 DIAGNOSIS — Z1211 Encounter for screening for malignant neoplasm of colon: Secondary | ICD-10-CM

## 2011-04-12 DIAGNOSIS — Z86010 Personal history of colonic polyps: Secondary | ICD-10-CM

## 2011-04-12 MED ORDER — SODIUM CHLORIDE 0.9 % IV SOLN: 500.0000 mL | INTRAVENOUS | Status: DC

## 2011-04-12 NOTE — Patient Instructions (Signed)
Discharge instructions given with verbal understanding. Handouts on diverticulosis and a high fiber diet given. Resume previous medications.YOU HAD AN ENDOSCOPIC PROCEDURE TODAY AT THE [facility]: Refer to the procedure report that was given to you for any specific questions about what was found during the examination.  If the procedure report does not answer your questions, please call your gastroenterologist to clarify.  If you requested that your care partner not be given the details of your procedure findings, then the procedure report has been included in a sealed envelope for you to review at your convenience later.  YOU SHOULD EXPECT: Some feelings of bloating in the abdomen. Passage of more gas than usual.  Walking can help get rid of the air that was put into your GI tract during the procedure and reduce the bloating. If you had a lower endoscopy (such as a colonoscopy or flexible sigmoidoscopy) you may notice spotting of blood in your stool or on the toilet paper. If you underwent a bowel prep for your procedure, then you may not have a normal bowel movement for a few days.  DIET: Your first meal following the procedure should be a light meal and then it is ok to progress to your normal diet.  A half-sandwich or bowl of soup is an example of a good first meal.  Heavy or fried foods are harder to digest and may make you feel nauseous or bloated.  Likewise meals heavy in dairy and vegetables can cause extra gas to form and this can also increase the bloating.  Drink plenty of fluids but you should avoid alcoholic beverages for 24 hours.  ACTIVITY: Your care partner should take you home directly after the procedure.  You should plan to take it easy, moving slowly for the rest of the day.  You can resume normal activity the day after the procedure however you should NOT DRIVE or use heavy machinery for 24 hours (because of the sedation medicines used during the test).    SYMPTOMS TO  REPORT IMMEDIATELY: A gastroenterologist can be reached at any hour.  During normal business hours, 8:30 AM to 5:00 PM Monday through Friday, call [phone].  After hours and on weekends, please call the GI answering service at [phone] who will take a message and have the physician on call contact you.   Following lower endoscopy (colonoscopy or flexible sigmoidoscopy):  Excessive amounts of blood in the stool  Significant tenderness or worsening of abdominal pains  Swelling of the abdomen that is new, acute  Fever of 100F or higher  FOLLOW UP: If any biopsies were taken you will be contacted by phone or by letter within the next 1-3 weeks.  Call your gastroenterologist if you have not heard about the biopsies in 3 weeks.  Our staff will call the home number listed on your records the next business day following your procedure to check on you and address any questions or concerns that you may have at that time regarding the information given to you following your procedure. This is a courtesy call and so if there is no answer at the home number and we have not heard from you through the emergency physician on call, we will assume that you have returned to your regular daily activities without incident.  SIGNATURES/CONFIDENTIALITY: You and/or your care partner have signed paperwork which will be entered into your electronic medical record.  These signatures attest to the fact that that the information above on your   After Visit Summary has been reviewed and is understood.  Full responsibility of the confidentiality of this discharge information lies with you and/or your care-partner.   

## 2011-04-12 NOTE — Progress Notes (Signed)
Patient did not experience any of the following events: a burn prior to discharge; a fall within the facility; wrong site/side/patient/procedure/implant event; or a hospital transfer or hospital admission upon discharge from the facility. (G8907) Patient did not have preoperative order for IV antibiotic SSI prophylaxis. (G8918)  

## 2011-04-12 NOTE — Op Note (Signed)
[facility] [address] [place], [place]  [address]  COLONOSCOPY PROCEDURE REPORT  PATIENT:  Thomas, Cherry  MR#:  [id number] BIRTHDATE:  04-27-1945, 65 yrs. old  GENDER:  male ENDOSCOPIST:  Barbette Hair. Arlyce Dice, MD REF. BY:  Si Gaul, M.D. PROCEDURE DATE:  04/12/2011 PROCEDURE:  Diagnostic Colonoscopy ASA CLASS:  Class II INDICATIONS:  Iron deficiency anemia MEDICATIONS:   MAC sedation, administered by CRNA propofol 150mg IV  DESCRIPTION OF PROCEDURE:   After the risks benefits and alternatives of the procedure were thoroughly explained, informed consent was obtained.  Digital rectal exam was performed and revealed no abnormalities.   The  endoscope was introduced through the anus and advanced to the cecum, which was identified by both the appendix and ileocecal valve, without limitations.  The quality of the prep was excellent, using MoviPrep.  The instrument was then slowly withdrawn as the colon was fully examined. <<PROCEDUREIMAGES>>  FINDINGS:  Mild diverticulosis was found in the sigmoid colon (see import6).  This was otherwise a normal examination of the colon (see import2, import1, and import4).   Retroflexed views in the rectum revealed no abnormalities.    The time to cecum =  1) 6.0 minutes. The scope was then withdrawn in  1) 6.50  minutes from the cecum and the procedure completed. COMPLICATIONS:  None ENDOSCOPIC IMPRESSION: 1) Mild diverticulosis in the sigmoid colon 2) Otherwise normal examination RECOMMENDATIONS: 1) Repeat Colonscopy in 10 years. REPEAT EXAM:  In 10 year(s) for Colonoscopy.  ______________________________ Barbette Hair. Arlyce Dice, MD  CC:  n. eSIGNED:   Barbette Hair. Aizik Reh at 04/12/2011 10:03 AM  Lanell Persons, [phone]

## 2011-04-13 ENCOUNTER — Telehealth: Payer: Self-pay | Admitting: *Deleted

## 2011-04-13 NOTE — Telephone Encounter (Signed)
  Follow up Call-  Call back number 04/12/2011 07/03/2010  Post procedure Call Back phone  # [phone]  Permission to leave phone message Yes -   Pt asleep, spoke with wife  Patient questions:  Do you have a fever, pain , or abdominal swelling? no Pain Score  0 *  Have you tolerated food without any problems? yes  Have you been able to return to your normal activities? yes  Do you have any questions about your discharge instructions: Diet   no Medications  no Follow up visit  no  Do you have questions or concerns about your Care? no  Actions: * If pain score is 4 or above: No action needed, pain <4.

## 2011-04-19 ENCOUNTER — Ambulatory Visit (HOSPITAL_BASED_OUTPATIENT_CLINIC_OR_DEPARTMENT_OTHER): Payer: Medicare Other | Admitting: Internal Medicine

## 2011-04-19 ENCOUNTER — Other Ambulatory Visit (HOSPITAL_BASED_OUTPATIENT_CLINIC_OR_DEPARTMENT_OTHER): Payer: Medicare Other

## 2011-04-19 VITALS — BP 149/79 | HR 70 | Temp 98.1°F | Ht 69.0 in | Wt 150.0 lb

## 2011-04-19 DIAGNOSIS — D473 Essential (hemorrhagic) thrombocythemia: Secondary | ICD-10-CM

## 2011-04-19 LAB — CBC WITH DIFFERENTIAL/PLATELET
BASO%: 0.3 % (ref 0.0–2.0)
Eosinophils Absolute: 0 10*3/uL (ref 0.0–0.5)
LYMPH%: 32.6 % (ref 14.0–49.0)
MCHC: 34 g/dL (ref 32.0–36.0)
MONO#: 0.1 10*3/uL (ref 0.1–0.9)
NEUT#: 1.5 10*3/uL (ref 1.5–6.5)
RBC: 2.78 10*6/uL — ABNORMAL LOW (ref 4.20–5.82)
RDW: 23.5 % — ABNORMAL HIGH (ref 11.0–14.6)
WBC: 2.4 10*3/uL — ABNORMAL LOW (ref 4.0–10.3)
lymph#: 0.8 10*3/uL — ABNORMAL LOW (ref 0.9–3.3)

## 2011-04-19 LAB — COMPREHENSIVE METABOLIC PANEL
ALT: 16 U/L (ref 0–53)
Albumin: 3.9 g/dL (ref 3.5–5.2)
CO2: 28 mEq/L (ref 19–32)
Chloride: 107 mEq/L (ref 96–112)
Potassium: 4 mEq/L (ref 3.5–5.3)
Sodium: 142 mEq/L (ref 135–145)
Total Bilirubin: 0.5 mg/dL (ref 0.3–1.2)
Total Protein: 5.9 g/dL — ABNORMAL LOW (ref 6.0–8.3)

## 2011-04-19 LAB — LACTATE DEHYDROGENASE: LDH: 165 U/L (ref 94–250)

## 2011-04-19 MED ORDER — PROCHLORPERAZINE MALEATE 10 MG PO TABS: 10.0000 mg | ORAL_TABLET | Freq: Four times a day (QID) | ORAL | Status: AC | PRN

## 2011-04-22 NOTE — Progress Notes (Signed)
[facility] Telephone:(336) [phone]   [phone]  OFFICE PROGRESS NOTE  [name], MD, MD [address] [facility] 4th Floor [place] [address]  DIAGNOSIS: Essential thrombocythemia.   PRIOR THERAPY: None   CURRENT THERAPY:  1) Hydroxyurea 1500 mg by mouth every day.  2) Integra plus 1 capsule by mouth daily for iron deficiency.  3) aspirin 81 mg by mouth daily.    INTERVAL HISTORY: Thomas Cherry 66 y.o. male returns to the clinic today for followup visit. The patient is doing fine and tolerating his treatment with Hydrea fairly well except for mild fatigue. He has repeat colonoscopy performed recently by Dr. Arlyce Dice that was unremarkable. He has repeat CBC performed earlier today and he is here for evaluation and discussion of his lab results. He denied having any significant fever or chills, no weight loss or night sweats. He has no chest pain or shortness of breath and no bleeding issues.  MEDICAL HISTORY: Past Medical History  Diagnosis Date  . GERD (gastroesophageal reflux disease)   . Allergy     heyfever  . Hypertension   . Palpitation   . Elevated platelet count     on hydroxyurea  . Heart murmur     ALLERGIES:   has no known allergies.  MEDICATIONS:  Current Outpatient Prescriptions  Medication Sig Dispense Refill  . aspirin 81 MG tablet Take 81 mg by mouth daily.        Marland Kitchen diltiazem (CARDIZEM CD) 180 MG 24 hr capsule TAKE ONE CAPSULE BY MOUTH DAILY  30 capsule  5  . esomeprazole (NEXIUM) 40 MG capsule Take 40 mg by mouth every other day.        Marland Kitchen FeFum-FePoly-FA-B Cmp-C-Biot (INTEGRA PLUS) CAPS Take 1 capsule by mouth daily.  30 capsule  0  . hydroxyurea (HYDREA) 500 MG capsule Take three capsules (1500 mg) daily  90 capsule  1  . triamcinolone (KENALOG) 0.025 % cream Apply topically 3 (three) times daily. As needed for rash  30 g  1  . prochlorperazine (COMPAZINE) 10 MG tablet Take 1 tablet (10 mg total) by mouth every 6 (six) hours  as needed.  30 tablet  0    SURGICAL HISTORY:  Past Surgical History  Procedure Date  . Hernia repair     REVIEW OF SYSTEMS:  A comprehensive review of systems was negative.   PHYSICAL EXAMINATION: General appearance: alert, cooperative and no distress Neck: no adenopathy Lymph nodes: Cervical, supraclavicular, and axillary nodes normal. Resp: clear to auscultation bilaterally Cardio: regular rate and rhythm, S1, S2 normal, no murmur, click, rub or gallop GI: soft, non-tender; bowel sounds normal; no masses,  no organomegaly Extremities: extremities normal, atraumatic, no cyanosis or edema Neurologic: Alert and oriented X 3, normal strength and tone. Normal symmetric reflexes. Normal coordination and gait  ECOG PERFORMANCE STATUS: 0 - Asymptomatic  Blood pressure 149/79, pulse 70, temperature 98.1 F (36.7 C), temperature source Oral, height 5\' 9"  (1.753 m), weight 150 lb (68.04 kg).  LABORATORY DATA: Lab Results  Component Value Date   WBC 2.4* 04/19/2011   HGB 10.2* 04/19/2011   HCT 30.0* 04/19/2011   MCV 108.0* 04/19/2011   PLT 411* 04/19/2011      Chemistry      Component Value Date/Time   NA 142 04/19/2011 0946   K 4.0 04/19/2011 0946   CL 107 04/19/2011 0946   CO2 28 04/19/2011 0946   BUN 20 04/19/2011 0946  CREATININE 0.99 04/19/2011 0946      Component Value Date/Time   CALCIUM 9.0 04/19/2011 0946   ALKPHOS 53 04/19/2011 0946   AST 19 04/19/2011 0946   ALT 16 04/19/2011 0946   BILITOT 0.5 04/19/2011 0946       RADIOGRAPHIC STUDIES: No results found.  ASSESSMENT: This is a very pleasant 66 years old white male with essential thrombocythemia currently on treatment with hydroxyurea 1500 mg by mouth daily. The patient is doing fine and tolerating his treatment fairly well. His platelets count has been less than 500,000 with the current treatment. There is also continues to have mild anemia and leukocytopenia secondary to his current treatment.  PLAN: I discussed the lab result with  Thomas Cherry and recommended for him to continue on the current dose for now. I will continue to check his CBC on by a weekly basis. I would see him back for followup visit in one month. The patient was advised to call me immediately if he has any concerning symptoms in the interval.   All questions were answered. The patient knows to call the clinic with any problems, questions or concerns. We can certainly see the patient much sooner if necessary.

## 2011-05-03 ENCOUNTER — Telehealth: Payer: Self-pay | Admitting: Medical Oncology

## 2011-05-03 ENCOUNTER — Other Ambulatory Visit (HOSPITAL_BASED_OUTPATIENT_CLINIC_OR_DEPARTMENT_OTHER): Payer: Medicare Other

## 2011-05-03 DIAGNOSIS — D473 Essential (hemorrhagic) thrombocythemia: Secondary | ICD-10-CM

## 2011-05-03 LAB — CBC WITH DIFFERENTIAL/PLATELET
Basophils Absolute: 0 10*3/uL (ref 0.0–0.1)
Eosinophils Absolute: 0 10*3/uL (ref 0.0–0.5)
HGB: 9.6 g/dL — ABNORMAL LOW (ref 13.0–17.1)
LYMPH%: 28.7 % (ref 14.0–49.0)
MONO#: 0.1 10*3/uL (ref 0.1–0.9)
NEUT#: 1.5 10*3/uL (ref 1.5–6.5)
Platelets: 490 10*3/uL — ABNORMAL HIGH (ref 140–400)
RBC: 2.48 10*6/uL — ABNORMAL LOW (ref 4.20–5.82)
RDW: 20.5 % — ABNORMAL HIGH (ref 11.0–14.6)
WBC: 2.4 10*3/uL — ABNORMAL LOW (ref 4.0–10.3)

## 2011-05-03 NOTE — Telephone Encounter (Signed)
Requests lab results - per Dr Donnald Garre tell pt to stay on same dose of hydrea. Pt notified .

## 2011-05-10 ENCOUNTER — Telehealth: Payer: Self-pay | Admitting: Medical Oncology

## 2011-05-10 DIAGNOSIS — R110 Nausea: Secondary | ICD-10-CM

## 2011-05-10 MED ORDER — ONDANSETRON HCL 8 MG PO TABS: 8.0000 mg | ORAL_TABLET | Freq: Three times a day (TID) | ORAL | Status: AC | PRN

## 2011-05-10 NOTE — Telephone Encounter (Signed)
Called to phamracy and pt

## 2011-05-10 NOTE — Telephone Encounter (Signed)
Request zofran for nausea

## 2011-05-17 ENCOUNTER — Ambulatory Visit (HOSPITAL_BASED_OUTPATIENT_CLINIC_OR_DEPARTMENT_OTHER): Payer: Medicare Other | Admitting: Internal Medicine

## 2011-05-17 ENCOUNTER — Other Ambulatory Visit (HOSPITAL_BASED_OUTPATIENT_CLINIC_OR_DEPARTMENT_OTHER): Payer: Medicare Other | Admitting: Lab

## 2011-05-17 ENCOUNTER — Telehealth: Payer: Self-pay | Admitting: Internal Medicine

## 2011-05-17 VITALS — BP 142/75 | HR 71 | Temp 97.0°F | Ht 69.0 in | Wt 150.9 lb

## 2011-05-17 DIAGNOSIS — D473 Essential (hemorrhagic) thrombocythemia: Secondary | ICD-10-CM

## 2011-05-17 LAB — CBC WITH DIFFERENTIAL/PLATELET
Basophils Absolute: 0 10*3/uL (ref 0.0–0.1)
HCT: 29.6 % — ABNORMAL LOW (ref 38.4–49.9)
HGB: 9.8 g/dL — ABNORMAL LOW (ref 13.0–17.1)
MONO#: 0.1 10*3/uL (ref 0.1–0.9)
NEUT%: 55.4 % (ref 39.0–75.0)
Platelets: 335 10*3/uL (ref 140–400)
WBC: 2 10*3/uL — ABNORMAL LOW (ref 4.0–10.3)
lymph#: 0.7 10*3/uL — ABNORMAL LOW (ref 0.9–3.3)

## 2011-05-17 LAB — LACTATE DEHYDROGENASE: LDH: 147 U/L (ref 94–250)

## 2011-05-17 NOTE — Progress Notes (Signed)
[facility] Telephone:(336) [phone]   [phone]  OFFICE PROGRESS NOTE  [name], MD, MD [address] [facility] 4th Floor [place] [address]  DIAGNOSIS: Essential thrombocythemia.   PRIOR THERAPY: None   CURRENT THERAPY:  1) Hydroxyurea 1500 mg by mouth every day.  2) Integra plus 1 capsule by mouth daily for iron deficiency.  3) aspirin 81 mg by mouth daily.    INTERVAL HISTORY: Thomas Cherry 66 y.o. male returns to the clinic today for followup visit. The patient is doing fine today with no specific complaints and he is tolerating his treatment with hydroxyurea fairly well. He has occasional nausea and this resolves with Zofran. He was seen by Dr. Malen Gauze at [facility] and he recommended for him to continue hydroxyurea as the best option but anagrelide was another option if needed. The patient denied having any bleeding issues, no chest pain or shortness of breath. He has repeat CBC performed earlier today and he is here for evaluation and discussion of his lab results.  MEDICAL HISTORY: Past Medical History  Diagnosis Date  . GERD (gastroesophageal reflux disease)   . Allergy     heyfever  . Hypertension   . Palpitation   . Elevated platelet count     on hydroxyurea  . Heart murmur     ALLERGIES:   has no known allergies.  MEDICATIONS:  Current Outpatient Prescriptions  Medication Sig Dispense Refill  . aspirin 81 MG tablet Take 81 mg by mouth daily.        Marland Kitchen diltiazem (CARDIZEM CD) 180 MG 24 hr capsule TAKE ONE CAPSULE BY MOUTH DAILY  30 capsule  5  . esomeprazole (NEXIUM) 40 MG capsule Take 40 mg by mouth every other day.        Marland Kitchen FeFum-FePoly-FA-B Cmp-C-Biot (INTEGRA PLUS) CAPS Take 1 capsule by mouth daily.  30 capsule  0  . hydroxyurea (HYDREA) 500 MG capsule Take three capsules (1500 mg) daily  90 capsule  1  . ondansetron (ZOFRAN) 8 MG tablet Take 1 tablet (8 mg total) by mouth every 8 (eight) hours as needed for nausea.  20  tablet  0  . triamcinolone (KENALOG) 0.025 % cream Apply topically 3 (three) times daily. As needed for rash  30 g  1    SURGICAL HISTORY:  Past Surgical History  Procedure Date  . Hernia repair     REVIEW OF SYSTEMS:  A comprehensive review of systems was negative except for: Constitutional: positive for fatigue   PHYSICAL EXAMINATION: General appearance: alert, cooperative, fatigued and no distress Neck: no adenopathy Lymph nodes: Cervical, supraclavicular, and axillary nodes normal. Resp: clear to auscultation bilaterally Cardio: regular rate and rhythm, S1, S2 normal, no murmur, click, rub or gallop GI: soft, non-tender; bowel sounds normal; no masses,  no organomegaly Extremities: extremities normal, atraumatic, no cyanosis or edema Neurologic: Alert and oriented X 3, normal strength and tone. Normal symmetric reflexes. Normal coordination and gait  ECOG PERFORMANCE STATUS: 0 - Asymptomatic  Blood pressure 142/75, pulse 71, temperature 97 F (36.1 C), temperature source Oral, height 5\' 9"  (1.753 m), weight 150 lb 14.4 oz (68.448 kg).  LABORATORY DATA: Lab Results  Component Value Date   WBC 2.0* 05/17/2011   HGB 9.8* 05/17/2011   HCT 29.6* 05/17/2011   MCV 116.7* 05/17/2011   PLT 335 05/17/2011      Chemistry      Component Value Date/Time   NA 142  04/19/2011 0946   K 4.0 04/19/2011 0946   CL 107 04/19/2011 0946   CO2 28 04/19/2011 0946   BUN 20 04/19/2011 0946   CREATININE 0.99 04/19/2011 0946      Component Value Date/Time   CALCIUM 9.0 04/19/2011 0946   ALKPHOS 53 04/19/2011 0946   AST 19 04/19/2011 0946   ALT 16 04/19/2011 0946   BILITOT 0.5 04/19/2011 0946       RADIOGRAPHIC STUDIES: No results found.  ASSESSMENT: This is a very pleasant 66 years old white male with essential thrombocythemia currently on treatment with hydroxyurea 1500 mg by mouth daily. The patient is tolerating her treatment fairly well but he has continuous decline in his total leukocyte count as well as  absolute neutrophil count in addition to anemia.  PLAN: I discussed the lab result with the patient. I recommended for him to decrease his hydroxyurea dose to 1000 mg by mouth daily. I also advise him to start taking folic acid 400 mcg on daily basis. I will continue to monitor his CBC closely by repeating blood work in 2 weeks and also in 4 weeks when I see him back for followup visit.  All questions were answered. The patient knows to call the clinic with any problems, questions or concerns. We can certainly see the patient much sooner if necessary.

## 2011-05-17 NOTE — Telephone Encounter (Signed)
Gv pt appt for april-may2013 

## 2011-05-21 ENCOUNTER — Other Ambulatory Visit: Payer: Medicare Other

## 2011-05-21 ENCOUNTER — Ambulatory Visit: Payer: Medicare Other | Admitting: Internal Medicine

## 2011-05-25 ENCOUNTER — Telehealth: Payer: Self-pay | Admitting: Medical Oncology

## 2011-05-25 ENCOUNTER — Ambulatory Visit (HOSPITAL_BASED_OUTPATIENT_CLINIC_OR_DEPARTMENT_OTHER): Payer: Medicare Other | Admitting: Lab

## 2011-05-25 ENCOUNTER — Other Ambulatory Visit: Payer: Self-pay | Admitting: Medical Oncology

## 2011-05-25 ENCOUNTER — Telehealth: Payer: Self-pay | Admitting: *Deleted

## 2011-05-25 DIAGNOSIS — D473 Essential (hemorrhagic) thrombocythemia: Secondary | ICD-10-CM

## 2011-05-25 LAB — CBC WITH DIFFERENTIAL/PLATELET
Basophils Absolute: 0 10*3/uL (ref 0.0–0.1)
Eosinophils Absolute: 0 10*3/uL (ref 0.0–0.5)
HCT: 30.9 % — ABNORMAL LOW (ref 38.4–49.9)
HGB: 10.4 g/dL — ABNORMAL LOW (ref 13.0–17.1)
LYMPH%: 3.3 % — ABNORMAL LOW (ref 14.0–49.0)
MCV: 116.1 fL — ABNORMAL HIGH (ref 79.3–98.0)
MONO%: 3 % (ref 0.0–14.0)
NEUT#: 2.7 10*3/uL (ref 1.5–6.5)
NEUT%: 92.9 % — ABNORMAL HIGH (ref 39.0–75.0)
Platelets: 309 10*3/uL (ref 140–400)

## 2011-05-25 LAB — LACTATE DEHYDROGENASE: LDH: 170 U/L (ref 94–250)

## 2011-05-25 NOTE — Telephone Encounter (Signed)
NO SIGNS OF INFECTION. PT. IS NAUSEATED BUT NO VOMITING. NO DIARRHEA. PT. IS FEELING "BAD." THIS NOTE TO DR.MOHAMED'S NURSE, DIANE BELL,RN.

## 2011-05-25 NOTE — Telephone Encounter (Addendum)
Called Thomas Cherry back . Per Dr Donnald Garre Thomas Cherry needs cbc here or see his primary care. Spoke to his wife and Thomas Cherry is coming here for CBC. Order entered and appointment made. CBC reviewed with Thomas Cherry and I told him to see his primary care for his symptoms.HE voices understanding.

## 2011-05-31 ENCOUNTER — Other Ambulatory Visit (HOSPITAL_BASED_OUTPATIENT_CLINIC_OR_DEPARTMENT_OTHER): Payer: Medicare Other | Admitting: Lab

## 2011-05-31 ENCOUNTER — Other Ambulatory Visit: Payer: Self-pay | Admitting: Internal Medicine

## 2011-05-31 ENCOUNTER — Telehealth: Payer: Self-pay | Admitting: *Deleted

## 2011-05-31 DIAGNOSIS — D473 Essential (hemorrhagic) thrombocythemia: Secondary | ICD-10-CM

## 2011-05-31 LAB — CBC WITH DIFFERENTIAL/PLATELET
Basophils Absolute: 0 10*3/uL (ref 0.0–0.1)
Eosinophils Absolute: 0 10*3/uL (ref 0.0–0.5)
HGB: 9.5 g/dL — ABNORMAL LOW (ref 13.0–17.1)
MCV: 115 fL — ABNORMAL HIGH (ref 79.3–98.0)
MONO%: 7.7 % (ref 0.0–14.0)
NEUT#: 1 10*3/uL — ABNORMAL LOW (ref 1.5–6.5)
Platelets: 355 10*3/uL (ref 140–400)
RDW: 14 % (ref 11.0–14.6)

## 2011-05-31 NOTE — Telephone Encounter (Signed)
Per Dr Donnald Garre, inform pt to practice neutropenic pxns due to ANC 1.0, WBC 2.1.  Pt verbalized understanding.  SLJ

## 2011-06-18 ENCOUNTER — Other Ambulatory Visit (HOSPITAL_BASED_OUTPATIENT_CLINIC_OR_DEPARTMENT_OTHER): Payer: Medicare Other | Admitting: Lab

## 2011-06-18 ENCOUNTER — Ambulatory Visit (HOSPITAL_BASED_OUTPATIENT_CLINIC_OR_DEPARTMENT_OTHER): Payer: Medicare Other | Admitting: Internal Medicine

## 2011-06-18 ENCOUNTER — Telehealth: Payer: Self-pay | Admitting: Internal Medicine

## 2011-06-18 VITALS — BP 133/83 | HR 74 | Temp 97.6°F | Ht 69.0 in | Wt 149.3 lb

## 2011-06-18 DIAGNOSIS — D473 Essential (hemorrhagic) thrombocythemia: Secondary | ICD-10-CM

## 2011-06-18 LAB — CBC WITH DIFFERENTIAL/PLATELET
Eosinophils Absolute: 0 10*3/uL (ref 0.0–0.5)
HCT: 32.5 % — ABNORMAL LOW (ref 38.4–49.9)
LYMPH%: 30.9 % (ref 14.0–49.0)
MCHC: 33.4 g/dL (ref 32.0–36.0)
MCV: 116.7 fL — ABNORMAL HIGH (ref 79.3–98.0)
MONO#: 0.3 10*3/uL (ref 0.1–0.9)
MONO%: 12.5 % (ref 0.0–14.0)
NEUT%: 55.1 % (ref 39.0–75.0)
Platelets: 685 10*3/uL — ABNORMAL HIGH (ref 140–400)
WBC: 2.6 10*3/uL — ABNORMAL LOW (ref 4.0–10.3)

## 2011-06-18 MED ORDER — ANAGRELIDE HCL 0.5 MG PO CAPS: 0.5000 mg | ORAL_CAPSULE | Freq: Every day | ORAL | Status: DC

## 2011-06-18 NOTE — Progress Notes (Signed)
[facility] Telephone:([phone]   [phone]  OFFICE PROGRESS NOTE  [name], MD, MD [address] 4th [address] [place] [address]  DIAGNOSIS: Essential thrombocythemia.   PRIOR THERAPY: None   CURRENT THERAPY:  1) Hydroxyurea 1000 mg by mouth every day.  2) Integra plus 1 capsule by mouth daily for iron deficiency.  3) aspirin 81 mg by mouth daily.    INTERVAL HISTORY: Thomas Cherry 66 y.o. male returns to the clinic today for monthly followup visit. The patient is doing fine today with no specific complaints. He is tolerating his treatment with hydroxyurea fairly well with no significant adverse effects. He denied having any fever or chills, no nausea or vomiting. He has no bleeding issues. Significant weight loss or night sweats. He has repeat CBC performed earlier today and he is here for evaluation and discussion of his lab results.  MEDICAL HISTORY: Past Medical History  Diagnosis Date  . GERD (gastroesophageal reflux disease)   . Allergy     heyfever  . Hypertension   . Palpitation   . Elevated platelet count     on hydroxyurea  . Heart murmur     ALLERGIES:   has no known allergies.  MEDICATIONS:  Current Outpatient Prescriptions  Medication Sig Dispense Refill  . aspirin 81 MG tablet Take 81 mg by mouth daily.        Marland Kitchen diltiazem (CARDIZEM CD) 180 MG 24 hr capsule TAKE ONE CAPSULE BY MOUTH DAILY  30 capsule  5  . esomeprazole (NEXIUM) 40 MG capsule Take 40 mg by mouth every other day.        . folic acid (FOLVITE) 1 MG tablet Take 1 mg by mouth daily.      . hydroxyurea (HYDREA) 500 MG capsule Take 500 mg by mouth daily. May take with food to minimize GI side effects.      . triamcinolone (KENALOG) 0.025 % cream Apply topically 3 (three) times daily. As needed for rash  30 g  1  . anagrelide (AGRYLIN) 0.5 MG capsule Take 1 capsule (0.5 mg total) by mouth daily.  30 capsule  0  . FeFum-FePoly-FA-B Cmp-C-Biot (INTEGRA  PLUS) CAPS Take 1 capsule by mouth daily.  30 capsule  0    SURGICAL HISTORY:  Past Surgical History  Procedure Date  . Hernia repair     REVIEW OF SYSTEMS:  A comprehensive review of systems was negative.   PHYSICAL EXAMINATION: General appearance: alert, cooperative and no distress Neck: no adenopathy Lymph nodes: Cervical, supraclavicular, and axillary nodes normal. Resp: clear to auscultation bilaterally Cardio: regular rate and rhythm, S1, S2 normal, no murmur, click, rub or gallop GI: soft, non-tender; bowel sounds normal; no masses,  no organomegaly Extremities: extremities normal, atraumatic, no cyanosis or edema Neurologic: Alert and oriented X 3, normal strength and tone. Normal symmetric reflexes. Normal coordination and gait  ECOG PERFORMANCE STATUS: 0 - Asymptomatic  Blood pressure 133/83, pulse 74, temperature 97.6 F (36.4 C), temperature source Oral, height 5\' 9"  (1.753 m), weight 149 lb 4.8 oz (67.722 kg).  LABORATORY DATA: Lab Results  Component Value Date   WBC 2.6* 06/18/2011   HGB 10.9* 06/18/2011   HCT 32.5* 06/18/2011   MCV 116.7* 06/18/2011   PLT 685* 06/18/2011      Chemistry      Component Value Date/Time   NA 142 04/19/2011 0946   K 4.0 04/19/2011 0946   CL 107 04/19/2011  0946   CO2 28 04/19/2011 0946   BUN 20 04/19/2011 0946   CREATININE 0.99 04/19/2011 0946      Component Value Date/Time   CALCIUM 9.0 04/19/2011 0946   ALKPHOS 53 04/19/2011 0946   AST 19 04/19/2011 0946   ALT 16 04/19/2011 0946   BILITOT 0.5 04/19/2011 0946       RADIOGRAPHIC STUDIES: No results found.  ASSESSMENT: This is a very pleasant 66 years old white male with essential thrombocythemia currently on hydroxyurea 1000 mg by mouth once a day. The patient is tolerating his treatment well and there is improvement in his total leukocytic count as well as his hemoglobin and hematocrit but his platelets count to start increasing again.  PLAN: I discussed the lab result with the patient. I  recommended for him to continue on hydroxyurea with the current dose. I also recommended for him starting anagrelide 0.5 mg by mouth daily to control his thrombocytopenia.  I will continue to see him on monthly basis but he would have repeat CBC every 2 weeks. I will adjust his dose of the anagrelide and hydroxyurea accordingly.  All questions were answered. The patient knows to call the clinic with any problems, questions or concerns. We can certainly see the patient much sooner if necessary.  I spent 15 minutes counseling the patient face to face. The total time spent in the appointment was 25 minutes.

## 2011-06-18 NOTE — Telephone Encounter (Signed)
Gv pt appts for [id number]

## 2011-07-02 ENCOUNTER — Other Ambulatory Visit (HOSPITAL_BASED_OUTPATIENT_CLINIC_OR_DEPARTMENT_OTHER): Payer: Medicare Other | Admitting: Lab

## 2011-07-02 DIAGNOSIS — D473 Essential (hemorrhagic) thrombocythemia: Secondary | ICD-10-CM

## 2011-07-02 LAB — CBC WITH DIFFERENTIAL/PLATELET
Basophils Absolute: 0 10*3/uL (ref 0.0–0.1)
EOS%: 0.7 % (ref 0.0–7.0)
HCT: 32.1 % — ABNORMAL LOW (ref 38.4–49.9)
HGB: 10.9 g/dL — ABNORMAL LOW (ref 13.0–17.1)
LYMPH%: 25.6 % (ref 14.0–49.0)
MCH: 38.4 pg — ABNORMAL HIGH (ref 27.2–33.4)
MCHC: 33.9 g/dL (ref 32.0–36.0)
MCV: 113.1 fL — ABNORMAL HIGH (ref 79.3–98.0)
MONO%: 8.4 % (ref 0.0–14.0)
NEUT%: 64.5 % (ref 39.0–75.0)

## 2011-07-03 ENCOUNTER — Telehealth: Payer: Self-pay | Admitting: Medical Oncology

## 2011-07-03 NOTE — Telephone Encounter (Signed)
Requests CBC results -given to pt per Dr Donnald Garre.

## 2011-07-10 ENCOUNTER — Telehealth: Payer: Self-pay | Admitting: Internal Medicine

## 2011-07-10 NOTE — Telephone Encounter (Signed)
pt called to r.s 6/3 appt to 6/5   aom

## 2011-07-12 ENCOUNTER — Other Ambulatory Visit: Payer: Self-pay | Admitting: Internal Medicine

## 2011-07-12 DIAGNOSIS — D473 Essential (hemorrhagic) thrombocythemia: Secondary | ICD-10-CM

## 2011-07-16 ENCOUNTER — Other Ambulatory Visit: Payer: Medicare Other | Admitting: Lab

## 2011-07-16 ENCOUNTER — Ambulatory Visit: Payer: Medicare Other | Admitting: Internal Medicine

## 2011-07-18 ENCOUNTER — Ambulatory Visit (HOSPITAL_BASED_OUTPATIENT_CLINIC_OR_DEPARTMENT_OTHER): Payer: Medicare Other | Admitting: Internal Medicine

## 2011-07-18 ENCOUNTER — Telehealth: Payer: Self-pay | Admitting: Internal Medicine

## 2011-07-18 ENCOUNTER — Other Ambulatory Visit (HOSPITAL_BASED_OUTPATIENT_CLINIC_OR_DEPARTMENT_OTHER): Payer: Medicare Other | Admitting: Lab

## 2011-07-18 VITALS — BP 138/74 | HR 74 | Temp 97.1°F | Ht 69.0 in | Wt 149.4 lb

## 2011-07-18 DIAGNOSIS — D473 Essential (hemorrhagic) thrombocythemia: Secondary | ICD-10-CM

## 2011-07-18 LAB — CBC WITH DIFFERENTIAL/PLATELET
BASO%: 0.6 % (ref 0.0–2.0)
EOS%: 1.2 % (ref 0.0–7.0)
HCT: 32 % — ABNORMAL LOW (ref 38.4–49.9)
LYMPH%: 22.8 % (ref 14.0–49.0)
MCH: 37.5 pg — ABNORMAL HIGH (ref 27.2–33.4)
MCHC: 33.3 g/dL (ref 32.0–36.0)
MCV: 112.6 fL — ABNORMAL HIGH (ref 79.3–98.0)
MONO#: 0.2 10*3/uL (ref 0.1–0.9)
MONO%: 5.3 % (ref 0.0–14.0)
NEUT%: 70.1 % (ref 39.0–75.0)
Platelets: 585 10*3/uL — ABNORMAL HIGH (ref 140–400)

## 2011-07-18 MED ORDER — ANAGRELIDE HCL 0.5 MG PO CAPS: 0.5000 mg | ORAL_CAPSULE | Freq: Every day | ORAL | Status: DC

## 2011-07-18 NOTE — Progress Notes (Signed)
[facility] [phone]   [phone]  OFFICE PROGRESS NOTE  [name], MD, MD [address] 4[address] [place] [address]  DIAGNOSIS: Essential thrombocythemia.   PRIOR THERAPY: None   CURRENT THERAPY:  1) Hydroxyurea 1000 mg by mouth every day.  2) Integra plus 1 capsule by mouth daily for iron deficiency.  3) aspirin 81 mg by mouth daily.  4) anagrelide 0.5 mg by mouth daily  INTERVAL HISTORY: Thomas Cherry 66 y.o. male returns to the clinic today for followup visit. The patient mentions that he feels very well these day was no significant complaints. He denied having any significant fatigue or weakness, no weight loss or night sweats. He has no bleeding issue. He is tolerating his treatment with anagrelide and hydroxyurea fairly well. He has repeat CBC performed earlier today and he is here for evaluation and discussion of his lab results.  MEDICAL HISTORY: Past Medical History  Diagnosis Date  . GERD (gastroesophageal reflux disease)   . Allergy     heyfever  . Hypertension   . Palpitation   . Elevated platelet count     on hydroxyurea  . Heart murmur     ALLERGIES:   has no known allergies.  MEDICATIONS:  Current Outpatient Prescriptions  Medication Sig Dispense Refill  . anagrelide (AGRYLIN) 0.5 MG capsule Take 1 capsule (0.5 mg total) by mouth daily.  30 capsule  0  . aspirin 81 MG tablet Take 81 mg by mouth daily.        Marland Kitchen diltiazem (CARDIZEM CD) 180 MG 24 hr capsule TAKE ONE CAPSULE BY MOUTH DAILY  30 capsule  5  . esomeprazole (NEXIUM) 40 MG capsule Take 40 mg by mouth every other day.        Marland Kitchen FeFum-FePoly-FA-B Cmp-C-Biot (INTEGRA PLUS) CAPS Take 1 capsule by mouth daily.  30 capsule  0  . FeFum-FePoly-FA-B Cmp-C-Biot (INTEGRA PLUS) CAPS TAKE ONE CAPSULE BY MOUTH EVERY DAY  30 each  1  . folic acid (FOLVITE) 1 MG tablet Take 1 mg by mouth daily.      . hydroxyurea (HYDREA) 500 MG capsule Take 1,000 mg by  mouth daily. May take with food to minimize GI side effects.      . triamcinolone (KENALOG) 0.025 % cream Apply topically 3 (three) times daily. As needed for rash  30 g  1    SURGICAL HISTORY:  Past Surgical History  Procedure Date  . Hernia repair     REVIEW OF SYSTEMS:  A comprehensive review of systems was negative.   PHYSICAL EXAMINATION: General appearance: alert, cooperative and no distress Neck: no adenopathy Lymph nodes: Cervical, supraclavicular, and axillary nodes normal. Resp: clear to auscultation bilaterally Cardio: regular rate and rhythm, S1, S2 normal, no murmur, click, rub or gallop GI: soft, non-tender; bowel sounds normal; no masses,  no organomegaly Extremities: extremities normal, atraumatic, no cyanosis or edema Neurologic: Alert and oriented X 3, normal strength and tone. Normal symmetric reflexes. Normal coordination and gait  ECOG PERFORMANCE STATUS: 0 - Asymptomatic  Blood pressure 138/74, pulse 74, temperature 97.1 F (36.2 C), temperature source Oral, height 5\' 9"  (1.753 m), weight 149 lb 6.4 oz (67.767 kg).  LABORATORY DATA: Lab Results  Component Value Date   WBC 4.0 07/18/2011   HGB 10.7* 07/18/2011   HCT 32.0* 07/18/2011   MCV 112.6* 07/18/2011   PLT 585* 07/18/2011      Chemistry  Component Value Date/Time   NA 142 04/19/2011 0946   K 4.0 04/19/2011 0946   CL 107 04/19/2011 0946   CO2 28 04/19/2011 0946   BUN 20 04/19/2011 0946   CREATININE 0.99 04/19/2011 0946      Component Value Date/Time   CALCIUM 9.0 04/19/2011 0946   ALKPHOS 53 04/19/2011 0946   AST 19 04/19/2011 0946   ALT 16 04/19/2011 0946   BILITOT 0.5 04/19/2011 0946       RADIOGRAPHIC STUDIES: No results found.  ASSESSMENT: This is a very pleasant 66 years old white male with history of essential thrombocythemia currently on treatment with hydroxyurea and anagrelide. The patient is doing fine and tolerating his treatment well. His total leukocytic count as well as neutrophils have recovered  and his platelets count are very close to the target number of less than 500,000.  PLAN: I recommended for the patient to continue with the current treatment regimen. He would continue to have blood work in 2, 4 and 8 weeks. He would come back for followup visit in 2 months for reevaluation. He was advised to call me immediately if he has any concerning symptoms.  All questions were answered. The patient knows to call the clinic with any problems, questions or concerns. We can certainly see the patient much sooner if necessary.

## 2011-07-18 NOTE — Telephone Encounter (Signed)
Gv pt appt for [id number] 

## 2011-07-19 ENCOUNTER — Other Ambulatory Visit: Payer: Self-pay | Admitting: Gastroenterology

## 2011-07-26 ENCOUNTER — Telehealth: Payer: Self-pay | Admitting: Internal Medicine

## 2011-07-26 NOTE — Telephone Encounter (Signed)
pt called top move up lab appt,called and moved to 9:30    aom

## 2011-07-30 ENCOUNTER — Other Ambulatory Visit (HOSPITAL_BASED_OUTPATIENT_CLINIC_OR_DEPARTMENT_OTHER): Payer: Medicare Other | Admitting: Lab

## 2011-07-30 DIAGNOSIS — D473 Essential (hemorrhagic) thrombocythemia: Secondary | ICD-10-CM

## 2011-07-30 LAB — CBC WITH DIFFERENTIAL/PLATELET
Basophils Absolute: 0 10*3/uL (ref 0.0–0.1)
Eosinophils Absolute: 0.1 10*3/uL (ref 0.0–0.5)
HCT: 35.5 % — ABNORMAL LOW (ref 38.4–49.9)
HGB: 11.8 g/dL — ABNORMAL LOW (ref 13.0–17.1)
LYMPH%: 36.4 % (ref 14.0–49.0)
MCHC: 33.3 g/dL (ref 32.0–36.0)
MONO#: 0.3 10*3/uL (ref 0.1–0.9)
NEUT#: 1.8 10*3/uL (ref 1.5–6.5)
NEUT%: 52.4 % (ref 39.0–75.0)
Platelets: 933 10*3/uL — ABNORMAL HIGH (ref 140–400)
WBC: 3.4 10*3/uL — ABNORMAL LOW (ref 4.0–10.3)

## 2011-07-30 NOTE — Progress Notes (Signed)
Quick Note:  Call patient with the result and increase Anagrelide to 1mg  po qd ______

## 2011-07-31 ENCOUNTER — Telehealth: Payer: Self-pay | Admitting: *Deleted

## 2011-07-31 NOTE — Telephone Encounter (Signed)
Message copied by Caren Griffins on Tue Jul 31, 2011 10:39 AM ------      Message from: Si Gaul      Created: Mon Jul 30, 2011 10:14 PM       Call patient with the result and increase Anagrelide to 1mg  po qd

## 2011-07-31 NOTE — Telephone Encounter (Signed)
Informed pt of increasing anagrelide to 1mg , he verbalized understanding.  Next lab appt is 08/15/11.  SLJ

## 2011-08-01 ENCOUNTER — Other Ambulatory Visit: Payer: Self-pay | Admitting: Internal Medicine

## 2011-08-01 ENCOUNTER — Telehealth: Payer: Self-pay | Admitting: Medical Oncology

## 2011-08-01 DIAGNOSIS — D473 Essential (hemorrhagic) thrombocythemia: Secondary | ICD-10-CM

## 2011-08-01 NOTE — Telephone Encounter (Signed)
Message copied by Charma Igo on Wed Aug 01, 2011  2:00 PM ------      Message from: [place], [place]      Created: Mon Jul 30, 2011 10:14 PM       Call patient with the result and increase Anagrelide to 1mg  po qd

## 2011-08-01 NOTE — Telephone Encounter (Signed)
Left message for pt to call.

## 2011-08-01 NOTE — Telephone Encounter (Signed)
Message copied by Charma Igo on Wed Aug 01, 2011  4:06 PM ------      Message from: Thomas Cherry      Created: Mon Jul 30, 2011 10:14 PM       Call patient with the result and increase Anagrelide to 1mg  po qd

## 2011-08-01 NOTE — Telephone Encounter (Signed)
Pt.notified

## 2011-08-02 ENCOUNTER — Other Ambulatory Visit: Payer: Self-pay | Admitting: *Deleted

## 2011-08-02 DIAGNOSIS — D473 Essential (hemorrhagic) thrombocythemia: Secondary | ICD-10-CM

## 2011-08-02 MED ORDER — ANAGRELIDE HCL 0.5 MG PO CAPS: 1.0000 mg | ORAL_CAPSULE | Freq: Every day | ORAL | Status: DC

## 2011-08-03 ENCOUNTER — Other Ambulatory Visit: Payer: Self-pay | Admitting: Internal Medicine

## 2011-08-08 ENCOUNTER — Telehealth: Payer: Self-pay | Admitting: Endocrinology

## 2011-08-08 ENCOUNTER — Other Ambulatory Visit: Payer: Self-pay | Admitting: Internal Medicine

## 2011-08-08 DIAGNOSIS — D473 Essential (hemorrhagic) thrombocythemia: Secondary | ICD-10-CM

## 2011-08-08 MED ORDER — ZOSTER VACCINE LIVE 19400 UNT/0.65ML ~~LOC~~ SOLR: 0.6500 mL | Freq: Once | SUBCUTANEOUS | Status: AC

## 2011-08-08 NOTE — Telephone Encounter (Signed)
Pt informed rx for shingles vaccine sent to pharmacy.

## 2011-08-08 NOTE — Telephone Encounter (Signed)
Request rx be called into [facility] [address] for shingles vac, please call pt on cell once sent

## 2011-08-08 NOTE — Telephone Encounter (Signed)
sent 

## 2011-08-09 ENCOUNTER — Other Ambulatory Visit: Payer: Self-pay | Admitting: Medical Oncology

## 2011-08-09 ENCOUNTER — Telehealth: Payer: Self-pay | Admitting: Medical Oncology

## 2011-08-09 DIAGNOSIS — D473 Essential (hemorrhagic) thrombocythemia: Secondary | ICD-10-CM

## 2011-08-09 MED ORDER — ANAGRELIDE HCL 0.5 MG PO CAPS: 0.5000 mg | ORAL_CAPSULE | Freq: Two times a day (BID) | ORAL | Status: DC

## 2011-08-09 MED ORDER — HYDROXYUREA 500 MG PO CAPS: 1000.0000 mg | ORAL_CAPSULE | Freq: Every day | ORAL | Status: DC

## 2011-08-09 NOTE — Telephone Encounter (Signed)
Called in hydrea 1000 mg daily ( 500 mg tablets)  and anagrelide 1mg   (0.5 mg capsules) daily and pt notified.

## 2011-08-09 NOTE — Telephone Encounter (Signed)
Wants to get shingles vaccine .  Is it okay? I called pt back -he has never had one. Dr Everardo All did order vaccine -pt requests Dr Donnald Garre opinion. I told pt to wait u ntil labs on 7/3 and for Dr Donnald Garre to decide. He understands.

## 2011-08-13 ENCOUNTER — Encounter: Payer: Self-pay | Admitting: *Deleted

## 2011-08-13 NOTE — Progress Notes (Signed)
Pt called wanting to know whether it was okay to get shingles vaccine.  Per Dr Donnald Garre, okay to get vaccine, pt verbalized understanding.  SLJ

## 2011-08-15 ENCOUNTER — Other Ambulatory Visit (HOSPITAL_BASED_OUTPATIENT_CLINIC_OR_DEPARTMENT_OTHER): Payer: Medicare Other | Admitting: Lab

## 2011-08-15 ENCOUNTER — Encounter: Payer: Self-pay | Admitting: *Deleted

## 2011-08-15 DIAGNOSIS — D473 Essential (hemorrhagic) thrombocythemia: Secondary | ICD-10-CM

## 2011-08-15 LAB — CBC WITH DIFFERENTIAL/PLATELET
BASO%: 0.3 % (ref 0.0–2.0)
Basophils Absolute: 0 10*3/uL (ref 0.0–0.1)
Eosinophils Absolute: 0 10*3/uL (ref 0.0–0.5)
HCT: 34.3 % — ABNORMAL LOW (ref 38.4–49.9)
HGB: 11.5 g/dL — ABNORMAL LOW (ref 13.0–17.1)
LYMPH%: 25.6 % (ref 14.0–49.0)
MCHC: 33.6 g/dL (ref 32.0–36.0)
MONO#: 0.3 10*3/uL (ref 0.1–0.9)
NEUT#: 2.6 10*3/uL (ref 1.5–6.5)
NEUT%: 66.6 % (ref 39.0–75.0)
Platelets: 816 10*3/uL — ABNORMAL HIGH (ref 140–400)
WBC: 3.9 10*3/uL — ABNORMAL LOW (ref 4.0–10.3)
lymph#: 1 10*3/uL (ref 0.9–3.3)

## 2011-08-15 NOTE — Progress Notes (Signed)
Per Dr Donnald Garre, CBC reviewed with pt's wife, pt is to increase anagrelide to 1.5mg .  Pt's wife verbalized understanding.

## 2011-08-20 ENCOUNTER — Telehealth: Payer: Self-pay | Admitting: Medical Oncology

## 2011-08-20 DIAGNOSIS — D473 Essential (hemorrhagic) thrombocythemia: Secondary | ICD-10-CM

## 2011-08-20 NOTE — Telephone Encounter (Signed)
Requests CBC next week. On June 6th labs ordered at  2,.4.and 8 weeks which will be end of July. Pt said these cbc's  were ordered when his counts were stable. I told him I will check with dr Donnald Garre to see if he wants them drawn at 8 weeks or before and at  appointment on 09/19/11 .

## 2011-08-20 NOTE — Telephone Encounter (Signed)
Per Dr Donnald Garre i will ordered cbc for next week -pt clarified that he can get a cbc the week of the 22nd. Order entered and schedule request sent

## 2011-08-21 ENCOUNTER — Telehealth: Payer: Self-pay | Admitting: Internal Medicine

## 2011-08-21 NOTE — Telephone Encounter (Signed)
called pt with lab appt   aom

## 2011-08-30 ENCOUNTER — Other Ambulatory Visit: Payer: Self-pay | Admitting: Internal Medicine

## 2011-09-03 ENCOUNTER — Telehealth: Payer: Self-pay | Admitting: Medical Oncology

## 2011-09-03 ENCOUNTER — Other Ambulatory Visit (HOSPITAL_BASED_OUTPATIENT_CLINIC_OR_DEPARTMENT_OTHER): Payer: Medicare Other

## 2011-09-03 DIAGNOSIS — D473 Essential (hemorrhagic) thrombocythemia: Secondary | ICD-10-CM

## 2011-09-03 LAB — CBC WITH DIFFERENTIAL/PLATELET
Basophils Absolute: 0 10*3/uL (ref 0.0–0.1)
EOS%: 0.7 % (ref 0.0–7.0)
Eosinophils Absolute: 0 10*3/uL (ref 0.0–0.5)
HCT: 32.1 % — ABNORMAL LOW (ref 38.4–49.9)
HGB: 10.6 g/dL — ABNORMAL LOW (ref 13.0–17.1)
MCH: 35.4 pg — ABNORMAL HIGH (ref 27.2–33.4)
MCV: 106.8 fL — ABNORMAL HIGH (ref 79.3–98.0)
MONO%: 6.8 % (ref 0.0–14.0)
NEUT#: 2.9 10*3/uL (ref 1.5–6.5)
NEUT%: 69.2 % (ref 39.0–75.0)
RDW: 14 % (ref 11.0–14.6)

## 2011-09-04 ENCOUNTER — Encounter: Payer: Self-pay | Admitting: Medical Oncology

## 2011-09-06 ENCOUNTER — Other Ambulatory Visit: Payer: Self-pay | Admitting: Internal Medicine

## 2011-09-06 NOTE — Telephone Encounter (Signed)
I told pt his CBC results. I called back and left a message per Dr Donnald Garre he needs to stay on same doses for Anagrelide and Hydrea

## 2011-09-19 ENCOUNTER — Other Ambulatory Visit (HOSPITAL_BASED_OUTPATIENT_CLINIC_OR_DEPARTMENT_OTHER): Payer: Medicare Other

## 2011-09-19 ENCOUNTER — Telehealth: Payer: Self-pay | Admitting: *Deleted

## 2011-09-19 ENCOUNTER — Ambulatory Visit (HOSPITAL_BASED_OUTPATIENT_CLINIC_OR_DEPARTMENT_OTHER): Payer: Medicare Other | Admitting: Internal Medicine

## 2011-09-19 VITALS — BP 166/81 | HR 69 | Temp 97.7°F | Resp 20 | Ht 69.0 in | Wt 151.0 lb

## 2011-09-19 DIAGNOSIS — D473 Essential (hemorrhagic) thrombocythemia: Secondary | ICD-10-CM

## 2011-09-19 LAB — CBC WITH DIFFERENTIAL/PLATELET
Eosinophils Absolute: 0 10*3/uL (ref 0.0–0.5)
MONO#: 0.3 10*3/uL (ref 0.1–0.9)
NEUT#: 2.4 10*3/uL (ref 1.5–6.5)
RBC: 3 10*6/uL — ABNORMAL LOW (ref 4.20–5.82)
RDW: 14.1 % (ref 11.0–14.6)
WBC: 4 10*3/uL (ref 4.0–10.3)
lymph#: 1.2 10*3/uL (ref 0.9–3.3)

## 2011-09-19 LAB — COMPREHENSIVE METABOLIC PANEL
Albumin: 3.8 g/dL (ref 3.5–5.2)
CO2: 28 mEq/L (ref 19–32)
Glucose, Bld: 88 mg/dL (ref 70–99)
Potassium: 5 mEq/L (ref 3.5–5.3)
Sodium: 140 mEq/L (ref 135–145)
Total Protein: 5.8 g/dL — ABNORMAL LOW (ref 6.0–8.3)

## 2011-09-19 LAB — LACTATE DEHYDROGENASE: LDH: 155 U/L (ref 94–250)

## 2011-09-19 NOTE — Telephone Encounter (Signed)
Gave patient appointment for 10-03-2011 and 10-18-2011 at 11:00am

## 2011-09-19 NOTE — Progress Notes (Signed)
[facility] Telephone:([phone]   Fax:(336) [phone]  OFFICE PROGRESS NOTE  Thomas Belling, MD [address] [facility] [address] [facility] 4th [address] [place] [address]  DIAGNOSIS: Essential thrombocythemia.   PRIOR THERAPY: None   CURRENT THERAPY:  1) Hydroxyurea 1000 mg by mouth every day.  2) Integra plus 1 capsule by mouth daily for iron deficiency.  3) aspirin 81 mg by mouth daily.  4) anagrelide 0.5 mg by mouth tid   INTERVAL HISTORY: Thomas Cherry 66 y.o. male returns to the clinic today for routine followup visit. The patient is doing fine today with no specific complaints. He is tolerating his treatment with hydroxyurea and anagrelide fairly well with no significant adverse effects. He denied having any significant weight loss or night sweats. He has no bleeding, bruises or ecchymosis. The patient has repeat CBC performed earlier today and he is here for evaluation and discussion of his lab results.  MEDICAL HISTORY: Past Medical History  Diagnosis Date  . GERD (gastroesophageal reflux disease)   . Allergy     heyfever  . Hypertension   . Palpitation   . Elevated platelet count     on hydroxyurea  . Heart murmur     ALLERGIES:   has no known allergies.  MEDICATIONS:  Current Outpatient Prescriptions  Medication Sig Dispense Refill  . anagrelide (AGRYLIN) 0.5 MG capsule 0.5 mg 3 (three) times daily.       Marland Kitchen aspirin 81 MG tablet Take 81 mg by mouth daily.        Marland Kitchen diltiazem (CARDIZEM CD) 180 MG 24 hr capsule TAKE ONE CAPSULE BY MOUTH DAILY  30 capsule  5  . esomeprazole (NEXIUM) 40 MG capsule Take 40 mg by mouth every other day.        Marland Kitchen FeFum-FePoly-FA-B Cmp-C-Biot (INTEGRA PLUS) CAPS Take 1 capsule by mouth daily.  30 capsule  0  . folic acid (FOLVITE) 1 MG tablet Take 1 mg by mouth daily.      . hydroxyurea (HYDREA) 500 MG capsule TAKE TWO CAPSULES BY MOUTH EVERY DAY  60 capsule  1  . NEXIUM 40 MG capsule TAKE ONE CAPSULE BY MOUTH DAILY BEFORE BREAKFAST  30  each  6  . triamcinolone (KENALOG) 0.025 % cream Apply topically 3 (three) times daily. As needed for rash  30 g  1  . DISCONTD: FeFum-FePoly-FA-B Cmp-C-Biot (INTEGRA PLUS) CAPS TAKE ONE CAPSULE BY MOUTH EVERY DAY  30 each  1    SURGICAL HISTORY:  Past Surgical History  Procedure Date  . Hernia repair     REVIEW OF SYSTEMS:  A comprehensive review of systems was negative.   PHYSICAL EXAMINATION: General appearance: alert, cooperative and no distress Head: Normocephalic, without obvious abnormality, atraumatic Neck: no adenopathy Lymph nodes: Cervical, supraclavicular, and axillary nodes normal. Resp: clear to auscultation bilaterally Cardio: regular rate and rhythm, S1, S2 normal, no murmur, click, rub or gallop GI: soft, non-tender; bowel sounds normal; no masses,  no organomegaly Extremities: extremities normal, atraumatic, no cyanosis or edema  ECOG PERFORMANCE STATUS: 0 - Asymptomatic  Blood pressure 166/81, pulse 69, temperature 97.7 F (36.5 C), temperature source Oral, resp. rate 20, height 5\' 9"  (1.753 m), weight 151 lb (68.493 kg).  LABORATORY DATA: Lab Results  Component Value Date   WBC 4.0 09/19/2011   HGB 10.7* 09/19/2011   HCT 31.7* 09/19/2011   MCV 105.8* 09/19/2011   PLT 779* 09/19/2011      Chemistry  Component Value Date/Time   NA 142 04/19/2011 0946   K 4.0 04/19/2011 0946   CL 107 04/19/2011 0946   CO2 28 04/19/2011 0946   BUN 20 04/19/2011 0946   CREATININE 0.99 04/19/2011 0946      Component Value Date/Time   CALCIUM 9.0 04/19/2011 0946   ALKPHOS 53 04/19/2011 0946   AST 19 04/19/2011 0946   ALT 16 04/19/2011 0946   BILITOT 0.5 04/19/2011 0946       RADIOGRAPHIC STUDIES: No results found.  ASSESSMENT: This is a very pleasant 66 years old white male with history of essential thrombocythemia currently on treatment with hydroxyurea and anagrelide. His platelets count still stable but elevated.   PLAN: I discussed the lab result with the patient. I recommended for  him to increase the dose of hydroxyurea to 1500 mg by mouth daily. He will continue on anagrelide 1.5 mg daily. I will continue to monitor his CBC on a weekly basis. I would see him back for followup visit in one month for reevaluation. He was advised to call me immediately if he has any concerning symptoms in the interval.  All questions were answered. The patient knows to call the clinic with any problems, questions or concerns. We can certainly see the patient much sooner if necessary.

## 2011-09-21 ENCOUNTER — Other Ambulatory Visit: Payer: Self-pay | Admitting: *Deleted

## 2011-09-21 ENCOUNTER — Encounter: Payer: Self-pay | Admitting: *Deleted

## 2011-09-21 DIAGNOSIS — D473 Essential (hemorrhagic) thrombocythemia: Secondary | ICD-10-CM

## 2011-09-21 MED ORDER — ANAGRELIDE HCL 0.5 MG PO CAPS: 0.5000 mg | ORAL_CAPSULE | Freq: Three times a day (TID) | ORAL | Status: DC

## 2011-09-21 MED ORDER — HYDROXYUREA 500 MG PO CAPS: ORAL_CAPSULE | ORAL | Status: DC

## 2011-09-21 NOTE — Telephone Encounter (Signed)
1200--Rec'd message from patient that he needed our office to call [facility] pharmacy for an increase in dose for his prescription of Anagrelide, due to increase, he will run out early. According to EMR, triage already had rec'd this refill request, called and spoke with pharmacist, and she states she did receive this and has it ordered for patient and will contact him once received in stock.

## 2011-09-21 NOTE — Progress Notes (Signed)
Med list review

## 2011-10-03 ENCOUNTER — Other Ambulatory Visit (HOSPITAL_BASED_OUTPATIENT_CLINIC_OR_DEPARTMENT_OTHER): Payer: Medicare Other | Admitting: Lab

## 2011-10-03 ENCOUNTER — Telehealth: Payer: Self-pay | Admitting: Medical Oncology

## 2011-10-03 DIAGNOSIS — D473 Essential (hemorrhagic) thrombocythemia: Secondary | ICD-10-CM

## 2011-10-03 LAB — CBC WITH DIFFERENTIAL/PLATELET
BASO%: 0.5 % (ref 0.0–2.0)
HCT: 30.7 % — ABNORMAL LOW (ref 38.4–49.9)
HGB: 10.4 g/dL — ABNORMAL LOW (ref 13.0–17.1)
MCHC: 34 g/dL (ref 32.0–36.0)
MONO#: 0.2 10*3/uL (ref 0.1–0.9)
NEUT#: 1.2 10*3/uL — ABNORMAL LOW (ref 1.5–6.5)
NEUT%: 51.1 % (ref 39.0–75.0)
WBC: 2.4 10*3/uL — ABNORMAL LOW (ref 4.0–10.3)
lymph#: 0.9 10*3/uL (ref 0.9–3.3)

## 2011-10-03 MED ORDER — HYDROXYUREA 500 MG PO CAPS: ORAL_CAPSULE | ORAL | Status: DC

## 2011-10-03 NOTE — Telephone Encounter (Signed)
Hydrea dose change given to pts wife

## 2011-10-09 ENCOUNTER — Other Ambulatory Visit: Payer: Self-pay | Admitting: Endocrinology

## 2011-10-09 MED ORDER — DILTIAZEM HCL ER COATED BEADS 180 MG PO CP24: ORAL_CAPSULE | ORAL | Status: DC

## 2011-10-09 NOTE — Telephone Encounter (Signed)
patient has switched to [facility], needs his cardizem sent to his new pharmacy   Rx for Cardizem sent to [facility], pt informed via VM and to callback office with any questions/concerns.

## 2011-10-17 ENCOUNTER — Other Ambulatory Visit: Payer: Self-pay | Admitting: *Deleted

## 2011-10-18 ENCOUNTER — Ambulatory Visit (HOSPITAL_BASED_OUTPATIENT_CLINIC_OR_DEPARTMENT_OTHER): Payer: Medicare Other | Admitting: Internal Medicine

## 2011-10-18 ENCOUNTER — Other Ambulatory Visit (HOSPITAL_BASED_OUTPATIENT_CLINIC_OR_DEPARTMENT_OTHER): Payer: Medicare Other

## 2011-10-18 ENCOUNTER — Telehealth: Payer: Self-pay | Admitting: Internal Medicine

## 2011-10-18 VITALS — BP 137/84 | HR 71 | Temp 97.1°F | Resp 20 | Ht 69.0 in | Wt 149.7 lb

## 2011-10-18 DIAGNOSIS — D473 Essential (hemorrhagic) thrombocythemia: Secondary | ICD-10-CM

## 2011-10-18 LAB — CBC WITH DIFFERENTIAL/PLATELET
Basophils Absolute: 0 10*3/uL (ref 0.0–0.1)
Eosinophils Absolute: 0 10*3/uL (ref 0.0–0.5)
HGB: 10.3 g/dL — ABNORMAL LOW (ref 13.0–17.1)
MCV: 106.6 fL — ABNORMAL HIGH (ref 79.3–98.0)
MONO#: 0.3 10*3/uL (ref 0.1–0.9)
MONO%: 9.5 % (ref 0.0–14.0)
NEUT#: 1.5 10*3/uL (ref 1.5–6.5)
RDW: 16.3 % — ABNORMAL HIGH (ref 11.0–14.6)
WBC: 2.8 10*3/uL — ABNORMAL LOW (ref 4.0–10.3)
lymph#: 1 10*3/uL (ref 0.9–3.3)

## 2011-10-18 NOTE — Patient Instructions (Signed)
Your CBC is stable today. Continue the same treatment regimen with hydroxyurea 1500 mg alternating with 1000 mg every other day. Continue on Integra plus 1 capsule by mouth daily Continue anagrelide 1.5 mg by mouth daily. Monthly CBC Followup in 2 months

## 2011-10-18 NOTE — Telephone Encounter (Signed)
Gave pt appt for October and November 2013 lab and MD

## 2011-10-18 NOTE — Progress Notes (Signed)
[facility] Telephone:(336) [phone]   Fax:(336) [phone]  OFFICE PROGRESS NOTE  [name], MD [address] [facility] [facility] 4th Floor [place] [address]  DIAGNOSIS: Essential thrombocythemia.   PRIOR THERAPY: None   CURRENT THERAPY:  1) Hydroxyurea 1000 mg alternating with 1500 mg by mouth every other day.  2) Integra plus 1 capsule by mouth daily for iron deficiency.  3) aspirin 81 mg by mouth daily.  4) anagrelide 0.5 mg by mouth tid    INTERVAL HISTORY: Thomas Cherry 66 y.o. male returns to the clinic today for followup visit. The patient is feeling fine today with no specific complaints. He denied having any significant bleeding issues. He denied having any headache or blurry vision. He has no significant weight loss or night sweats. No chest pain, shortness breath, cough or hemoptysis. The patient is tolerating his treatment with Hydrea and anagrelide fairly well with no significant adverse effects. He has repeat CBC performed earlier today and he is here for evaluation and discussion of his lab and treatment options.  MEDICAL HISTORY: Past Medical History  Diagnosis Date  . GERD (gastroesophageal reflux disease)   . Allergy     heyfever  . Hypertension   . Palpitation   . Elevated platelet count     on hydroxyurea  . Heart murmur     ALLERGIES:   has no known allergies.  MEDICATIONS:  Current Outpatient Prescriptions  Medication Sig Dispense Refill  . anagrelide (AGRYLIN) 0.5 MG capsule Take 1 capsule (0.5 mg total) by mouth 3 (three) times daily.  90 capsule  1  . aspirin 81 MG tablet Take 81 mg by mouth daily.        Marland Kitchen diltiazem (CARDIZEM CD) 180 MG 24 hr capsule TAKE ONE CAPSULE BY MOUTH DAILY  30 capsule  5  . esomeprazole (NEXIUM) 40 MG capsule Take 40 mg by mouth every other day.        Marland Kitchen FeFum-FePoly-FA-B Cmp-C-Biot (INTEGRA PLUS) CAPS Take 1 capsule by mouth daily.  30 capsule  0  . folic acid (FOLVITE) 1 MG tablet Take 1 mg by mouth daily.       . hydroxyurea (HYDREA) 500 MG capsule Take three capsules (1500 mg) daily.  May take with food to minimize GI side effects.  90 capsule  1  . NEXIUM 40 MG capsule TAKE ONE CAPSULE BY MOUTH DAILY BEFORE BREAKFAST  30 each  6  . triamcinolone (KENALOG) 0.025 % cream Apply topically 3 (three) times daily. As needed for rash  30 g  1    SURGICAL HISTORY:  Past Surgical History  Procedure Date  . Hernia repair     REVIEW OF SYSTEMS:  A comprehensive review of systems was negative.   PHYSICAL EXAMINATION: General appearance: alert, cooperative and no distress Neck: no adenopathy Lymph nodes: Cervical, supraclavicular, and axillary nodes normal. Resp: clear to auscultation bilaterally Cardio: regular rate and rhythm, S1, S2 normal, no murmur, click, rub or gallop GI: soft, non-tender; bowel sounds normal; no masses,  no organomegaly Extremities: extremities normal, atraumatic, no cyanosis or edema Neurologic: Alert and oriented X 3, normal strength and tone. Normal symmetric reflexes. Normal coordination and gait  ECOG PERFORMANCE STATUS: 0 - Asymptomatic  There were no vitals taken for this visit.  LABORATORY DATA: Lab Results  Component Value Date   WBC 2.8* 10/18/2011   HGB 10.3* 10/18/2011   HCT 30.7* 10/18/2011   MCV 106.6* 10/18/2011   PLT  596* 10/18/2011      Chemistry      Component Value Date/Time   NA 140 09/19/2011 1154   K 5.0 09/19/2011 1154   CL 107 09/19/2011 1154   CO2 28 09/19/2011 1154   BUN 26* 09/19/2011 1154   CREATININE 1.13 09/19/2011 1154      Component Value Date/Time   CALCIUM 8.8 09/19/2011 1154   ALKPHOS 49 09/19/2011 1154   AST 21 09/19/2011 1154   ALT 16 09/19/2011 1154   BILITOT 0.4 09/19/2011 1154       RADIOGRAPHIC STUDIES: No results found.  ASSESSMENT: This is a very pleasant 66 years old white male with essential thrombocythemia currently on treatment with hydroxyurea and anagrelide and tolerating it fairly well with reasonable control of his platelets  count.  PLAN: I recommended for the patient to continue on the current treatment for now. I would see him back for followup visit in 2 months. He would have monthly CBC and comprehensive metabolic panel for evaluation of his platelets count and leukocytopenia. He was advised to call immediately if he has any concerning symptoms in the interval. All questions were answered. The patient knows to call the clinic with any problems, questions or concerns. We can certainly see the patient much sooner if necessary.

## 2011-11-16 ENCOUNTER — Telehealth: Payer: Self-pay | Admitting: *Deleted

## 2011-11-16 ENCOUNTER — Other Ambulatory Visit (HOSPITAL_BASED_OUTPATIENT_CLINIC_OR_DEPARTMENT_OTHER): Payer: Medicare Other | Admitting: Lab

## 2011-11-16 DIAGNOSIS — D473 Essential (hemorrhagic) thrombocythemia: Secondary | ICD-10-CM

## 2011-11-16 LAB — CBC WITH DIFFERENTIAL/PLATELET
Basophils Absolute: 0 10*3/uL (ref 0.0–0.1)
Eosinophils Absolute: 0 10*3/uL (ref 0.0–0.5)
HCT: 31.5 % — ABNORMAL LOW (ref 38.4–49.9)
HGB: 10.7 g/dL — ABNORMAL LOW (ref 13.0–17.1)
LYMPH%: 34.5 % (ref 14.0–49.0)
MCV: 107.8 fL — ABNORMAL HIGH (ref 79.3–98.0)
MONO#: 0.2 10*3/uL (ref 0.1–0.9)
MONO%: 8.5 % (ref 0.0–14.0)
NEUT#: 1.4 10*3/uL — ABNORMAL LOW (ref 1.5–6.5)
NEUT%: 55.5 % (ref 39.0–75.0)
Platelets: 688 10*3/uL — ABNORMAL HIGH (ref 140–400)

## 2011-11-16 LAB — COMPREHENSIVE METABOLIC PANEL (CC13)
Alkaline Phosphatase: 55 U/L (ref 40–150)
BUN: 20 mg/dL (ref 7.0–26.0)
CO2: 25 mEq/L (ref 22–29)
Glucose: 97 mg/dl (ref 70–99)
Total Bilirubin: 0.6 mg/dL (ref 0.20–1.20)

## 2011-11-16 MED ORDER — INTEGRA PLUS PO CAPS: 1.0000 | ORAL_CAPSULE | Freq: Every day | ORAL | Status: DC

## 2011-11-16 NOTE — Telephone Encounter (Signed)
Pt called wanting results of labwork.  Informed pt of CBC results and that we will be in touch Monday when Dr Donnald Garre returns whether he would like to adjust his medications.  Pt verbalized understanding.  SLJ

## 2011-11-19 ENCOUNTER — Telehealth: Payer: Self-pay | Admitting: *Deleted

## 2011-11-19 NOTE — Telephone Encounter (Signed)
Pt called requesting lab results.  Per Dr Donnald Garre, lab results given to pt, pt is to continue same dose of anagrelide and hydroxyurea.  Pt verbalized understanding and is to come back as scheduled.  SLJ

## 2011-11-20 ENCOUNTER — Other Ambulatory Visit: Payer: Self-pay | Admitting: Internal Medicine

## 2011-12-12 ENCOUNTER — Telehealth: Payer: Self-pay | Admitting: Medical Oncology

## 2011-12-12 DIAGNOSIS — D473 Essential (hemorrhagic) thrombocythemia: Secondary | ICD-10-CM

## 2011-12-12 NOTE — Telephone Encounter (Signed)
Pt has infected wisdom tooth that is scheduled for extraction this Friday -is it ok per Dr Arbutus Ped?

## 2011-12-12 NOTE — Telephone Encounter (Signed)
Pt scheduled for cbc tomorrow -tooth extraction moved up to tomorrow at 0900

## 2011-12-13 ENCOUNTER — Other Ambulatory Visit (HOSPITAL_BASED_OUTPATIENT_CLINIC_OR_DEPARTMENT_OTHER): Payer: Medicare Other | Admitting: Lab

## 2011-12-13 ENCOUNTER — Telehealth: Payer: Self-pay | Admitting: Medical Oncology

## 2011-12-13 DIAGNOSIS — D473 Essential (hemorrhagic) thrombocythemia: Secondary | ICD-10-CM

## 2011-12-13 LAB — CBC WITH DIFFERENTIAL/PLATELET
Basophils Absolute: 0 10*3/uL (ref 0.0–0.1)
Eosinophils Absolute: 0 10*3/uL (ref 0.0–0.5)
HGB: 10.3 g/dL — ABNORMAL LOW (ref 13.0–17.1)
MCV: 107.8 fL — ABNORMAL HIGH (ref 79.3–98.0)
MONO#: 0.3 10*3/uL (ref 0.1–0.9)
MONO%: 10.7 % (ref 0.0–14.0)
NEUT#: 1.1 10*3/uL — ABNORMAL LOW (ref 1.5–6.5)
Platelets: 628 10*3/uL — ABNORMAL HIGH (ref 140–400)
RBC: 2.79 10*6/uL — ABNORMAL LOW (ref 4.20–5.82)
RDW: 15.6 % — ABNORMAL HIGH (ref 11.0–14.6)
WBC: 2.4 10*3/uL — ABNORMAL LOW (ref 4.0–10.3)

## 2011-12-13 NOTE — Telephone Encounter (Signed)
Per Dr Arbutus Ped it is ok for pt to have tooth extracted-pt notified

## 2011-12-18 ENCOUNTER — Other Ambulatory Visit (HOSPITAL_BASED_OUTPATIENT_CLINIC_OR_DEPARTMENT_OTHER): Payer: Medicare Other

## 2011-12-18 ENCOUNTER — Other Ambulatory Visit: Payer: Medicare Other

## 2011-12-18 ENCOUNTER — Other Ambulatory Visit: Payer: Self-pay | Admitting: Internal Medicine

## 2011-12-18 ENCOUNTER — Ambulatory Visit (HOSPITAL_BASED_OUTPATIENT_CLINIC_OR_DEPARTMENT_OTHER): Payer: Medicare Other | Admitting: Internal Medicine

## 2011-12-18 VITALS — BP 164/77 | HR 72 | Temp 96.5°F | Resp 20 | Ht 69.0 in | Wt 148.0 lb

## 2011-12-18 DIAGNOSIS — D473 Essential (hemorrhagic) thrombocythemia: Secondary | ICD-10-CM

## 2011-12-18 LAB — CBC WITH DIFFERENTIAL/PLATELET
Basophils Absolute: 0 10*3/uL (ref 0.0–0.1)
EOS%: 1.2 % (ref 0.0–7.0)
Eosinophils Absolute: 0 10*3/uL (ref 0.0–0.5)
HCT: 30.4 % — ABNORMAL LOW (ref 38.4–49.9)
HGB: 9.9 g/dL — ABNORMAL LOW (ref 13.0–17.1)
LYMPH%: 38.4 % (ref 14.0–49.0)
MCH: 34.7 pg — ABNORMAL HIGH (ref 27.2–33.4)
MCV: 106.7 fL — ABNORMAL HIGH (ref 79.3–98.0)
MONO%: 7.2 % (ref 0.0–14.0)
NEUT#: 1.3 10*3/uL — ABNORMAL LOW (ref 1.5–6.5)
NEUT%: 52.4 % (ref 39.0–75.0)
Platelets: 681 10*3/uL — ABNORMAL HIGH (ref 140–400)
RDW: 16.2 % — ABNORMAL HIGH (ref 11.0–14.6)

## 2011-12-18 LAB — COMPREHENSIVE METABOLIC PANEL (CC13)
Alkaline Phosphatase: 62 U/L (ref 40–150)
BUN: 23 mg/dL (ref 7.0–26.0)
CO2: 27 mEq/L (ref 22–29)
Glucose: 87 mg/dl (ref 70–99)
Sodium: 140 mEq/L (ref 136–145)
Total Bilirubin: 0.4 mg/dL (ref 0.20–1.20)
Total Protein: 6.4 g/dL (ref 6.4–8.3)

## 2011-12-18 NOTE — Progress Notes (Signed)
[facility] Telephone:(336) [phone]   Fax:([phone]  OFFICE PROGRESS NOTE  [name], MD [address] 4th [address] [place] [address]  DIAGNOSIS: Essential thrombocythemia.   PRIOR THERAPY: None    CURRENT THERAPY:  1) Hydroxyurea 1000 mg alternating with 1500 mg by mouth every other day.  2) Integra plus 1 capsule by mouth daily for iron deficiency.  3) aspirin 81 mg by mouth daily.  4) anagrelide 0.5 mg by mouth tid    INTERVAL HISTORY: Thomas Cherry 66 y.o. male returns to the clinic today for routine followup visit. The patient is feeling fine today with no specific complaints. He denied having any significant fatigue or weakness. He denied having any significant chest pain, shortness breath, cough or hemoptysis. He has no weight loss or night sweats. No bleeding issues or palpable lymphadenopathy. He is tolerating his treatment with hydroxyurea as well as anagrelide fairly well. He has repeat CBC performed earlier today and he is here for evaluation and discussion of his lab results. He underwent a dental extraction recently and everything went well.  MEDICAL HISTORY: Past Medical History  Diagnosis Date  . GERD (gastroesophageal reflux disease)   . Allergy     heyfever  . Hypertension   . Palpitation   . Elevated platelet count     on hydroxyurea  . Heart murmur     ALLERGIES:   has no known allergies.  MEDICATIONS:  Current Outpatient Prescriptions  Medication Sig Dispense Refill  . anagrelide (AGRYLIN) 0.5 MG capsule TAKE ONE CAPSULE BY MOUTH THREE TIMES DAILY  90 capsule  0  . aspirin 81 MG tablet Take 81 mg by mouth daily.        Marland Kitchen diltiazem (CARDIZEM CD) 180 MG 24 hr capsule TAKE ONE CAPSULE BY MOUTH DAILY  30 capsule  5  . esomeprazole (NEXIUM) 40 MG capsule Take 40 mg by mouth every other day.        Marland Kitchen FeFum-FePoly-FA-B Cmp-C-Biot (INTEGRA PLUS) CAPS Take 1 capsule by mouth daily.  30 capsule  2  . folic acid (FOLVITE) 1  MG tablet Take 1 mg by mouth daily.      . hydroxyurea (HYDREA) 500 MG capsule Take three capsules (1500 mg) daily.  May take with food to minimize GI side effects.  90 capsule  1  . NEXIUM 40 MG capsule TAKE ONE CAPSULE BY MOUTH DAILY BEFORE BREAKFAST  30 each  6    SURGICAL HISTORY:  Past Surgical History  Procedure Date  . Hernia repair     REVIEW OF SYSTEMS:  A comprehensive review of systems was negative.   PHYSICAL EXAMINATION: General appearance: alert, cooperative and no distress Head: Normocephalic, without obvious abnormality, atraumatic Neck: no adenopathy Lymph nodes: Cervical, supraclavicular, and axillary nodes normal. Resp: clear to auscultation bilaterally Cardio: regular rate and rhythm, S1, S2 normal, no murmur, click, rub or gallop GI: soft, non-tender; bowel sounds normal; no masses,  no organomegaly Extremities: extremities normal, atraumatic, no cyanosis or edema  ECOG PERFORMANCE STATUS: 1 - Symptomatic but completely ambulatory  Blood pressure 164/77, pulse 72, temperature 96.5 F (35.8 C), temperature source Oral, resp. rate 20, height 5\' 9"  (1.753 m), weight 148 lb (67.132 kg).  LABORATORY DATA: Lab Results  Component Value Date   WBC 2.5* 12/18/2011   HGB 9.9* 12/18/2011   HCT 30.4* 12/18/2011   MCV 106.7* 12/18/2011   PLT 681* 12/18/2011      Chemistry  Component Value Date/Time   NA 142 11/16/2011 1136   NA 140 09/19/2011 1154   K 4.6 11/16/2011 1136   K 5.0 09/19/2011 1154   CL 109* 11/16/2011 1136   CL 107 09/19/2011 1154   CO2 25 11/16/2011 1136   CO2 28 09/19/2011 1154   BUN 20.0 11/16/2011 1136   BUN 26* 09/19/2011 1154   CREATININE 1.1 11/16/2011 1136   CREATININE 1.13 09/19/2011 1154      Component Value Date/Time   CALCIUM 9.1 11/16/2011 1136   CALCIUM 8.8 09/19/2011 1154   ALKPHOS 55 11/16/2011 1136   ALKPHOS 49 09/19/2011 1154   AST 19 11/16/2011 1136   AST 21 09/19/2011 1154   ALT 15 11/16/2011 1136   ALT 16 09/19/2011 1154   BILITOT 0.60  11/16/2011 1136   BILITOT 0.4 09/19/2011 1154       RADIOGRAPHIC STUDIES: No results found.  ASSESSMENT: This is a very pleasant 66 years old white male with history of essential thrombocythemia currently on treatment with hydroxyurea as well as anagrelide. He is tolerating his treatment fairly well with no significant adverse effects. His CBC today showed stable white blood count, hemoglobin and hematocrit as well as platelets count.  PLAN: I discussed the lab result with the patient. I recommended for him to continue on the same treatment regimen for now. I would see him back for followup visit in 2 months for reevaluation. He would continue to have monthly CBC. He was advised to call immediately if he has any concerning symptoms in the interval.  All questions were answered. The patient knows to call the clinic with any problems, questions or concerns. We can certainly see the patient much sooner if necessary.

## 2011-12-18 NOTE — Telephone Encounter (Signed)
Gave pt appt for lab and MD for January 2014 °

## 2011-12-18 NOTE — Patient Instructions (Signed)
Your CBC today is stable. Continue current regimen with hydroxyurea and anagrelide Monthly CBC. Followup in 2 months.

## 2011-12-20 ENCOUNTER — Telehealth: Payer: Self-pay | Admitting: *Deleted

## 2011-12-20 NOTE — Telephone Encounter (Signed)
Looked at patient's schedule and the appointment have been made

## 2012-01-15 ENCOUNTER — Other Ambulatory Visit (HOSPITAL_BASED_OUTPATIENT_CLINIC_OR_DEPARTMENT_OTHER): Payer: Medicare Other

## 2012-01-15 ENCOUNTER — Other Ambulatory Visit: Payer: Self-pay | Admitting: Internal Medicine

## 2012-01-15 DIAGNOSIS — D473 Essential (hemorrhagic) thrombocythemia: Secondary | ICD-10-CM

## 2012-01-15 LAB — CBC WITH DIFFERENTIAL/PLATELET
Basophils Absolute: 0 10*3/uL (ref 0.0–0.1)
Eosinophils Absolute: 0 10*3/uL (ref 0.0–0.5)
HGB: 10.5 g/dL — ABNORMAL LOW (ref 13.0–17.1)
MONO#: 0.2 10*3/uL (ref 0.1–0.9)
NEUT#: 1.4 10*3/uL — ABNORMAL LOW (ref 1.5–6.5)
Platelets: 728 10*3/uL — ABNORMAL HIGH (ref 140–400)
RBC: 2.8 10*6/uL — ABNORMAL LOW (ref 4.20–5.82)
RDW: 15.2 % — ABNORMAL HIGH (ref 11.0–14.6)
WBC: 2.6 10*3/uL — ABNORMAL LOW (ref 4.0–10.3)

## 2012-01-15 LAB — LACTATE DEHYDROGENASE (CC13): LDH: 206 U/L (ref 125–245)

## 2012-01-15 NOTE — Progress Notes (Signed)
Quick Note:  Call patient with the result and increase Anagrelide to 2 mg po qd (2ab 0.5 mg each bid) ______

## 2012-01-16 ENCOUNTER — Telehealth: Payer: Self-pay | Admitting: Medical Oncology

## 2012-01-16 DIAGNOSIS — D473 Essential (hemorrhagic) thrombocythemia: Secondary | ICD-10-CM

## 2012-01-16 MED ORDER — ANAGRELIDE HCL 0.5 MG PO CAPS: 0.5000 mg | ORAL_CAPSULE | Freq: Two times a day (BID) | ORAL | Status: DC

## 2012-01-16 NOTE — Telephone Encounter (Signed)
Pt notified , mar updated

## 2012-01-16 NOTE — Telephone Encounter (Signed)
Message copied by Charma Igo on Wed Jan 16, 2012  9:02 AM ------      Message from: Si Gaul      Created: Tue Jan 15, 2012 11:04 PM       Call patient with the result and increase Anagrelide to 2 mg po qd (2ab 0.5 mg each bid)

## 2012-01-17 ENCOUNTER — Telehealth: Payer: Self-pay | Admitting: *Deleted

## 2012-01-17 NOTE — Telephone Encounter (Signed)
Error.  Pt already called with info.

## 2012-01-17 NOTE — Telephone Encounter (Signed)
Message copied by Caren Griffins on Thu Jan 17, 2012 10:48 AM ------      Message from: Si Gaul      Created: Tue Jan 15, 2012 11:04 PM       Call patient with the result and increase Anagrelide to 2 mg po qd (2ab 0.5 mg each bid)

## 2012-02-07 ENCOUNTER — Other Ambulatory Visit: Payer: Self-pay | Admitting: Medical Oncology

## 2012-02-07 DIAGNOSIS — D473 Essential (hemorrhagic) thrombocythemia: Secondary | ICD-10-CM

## 2012-02-07 MED ORDER — ANAGRELIDE HCL 0.5 MG PO CAPS: 0.5000 mg | ORAL_CAPSULE | Freq: Two times a day (BID) | ORAL | Status: DC

## 2012-02-07 NOTE — Telephone Encounter (Signed)
Called in refill to pharmacy 

## 2012-02-08 ENCOUNTER — Ambulatory Visit (INDEPENDENT_AMBULATORY_CARE_PROVIDER_SITE_OTHER): Payer: Medicare Other | Admitting: Internal Medicine

## 2012-02-08 ENCOUNTER — Encounter: Payer: Self-pay | Admitting: Internal Medicine

## 2012-02-08 VITALS — BP 158/82 | HR 76 | Temp 98.0°F | Resp 16 | Wt 150.0 lb

## 2012-02-08 DIAGNOSIS — I10 Essential (primary) hypertension: Secondary | ICD-10-CM

## 2012-02-08 MED ORDER — LISINOPRIL 10 MG PO TABS: 10.0000 mg | ORAL_TABLET | Freq: Every day | ORAL | Status: DC

## 2012-02-08 NOTE — Patient Instructions (Signed)

## 2012-02-08 NOTE — Progress Notes (Signed)
Subjective:    Patient ID: Thomas Cherry, male    DOB: Feb 15, 1945, 66 y.o.   MRN: [id number]  HPI  Pt presents to the clinic today with c/o blood pressure problems. He has noticed for the past 2-3 weeks that his blood pressure has been elevated. He sometimes has a headache in the morning tha tresolves after about 30 minutes. He has been on cardizem for about a year now due to high blood pressure with palpitations. He has been tolerating the medicine well, he just feels like it is not as effective.  Review of Systems  Past Medical History  Diagnosis Date  . GERD (gastroesophageal reflux disease)   . Allergy     heyfever  . Hypertension   . Palpitation   . Elevated platelet count     on hydroxyurea  . Heart murmur     Current Outpatient Prescriptions  Medication Sig Dispense Refill  . anagrelide (AGRYLIN) 0.5 MG capsule Take 1 capsule (0.5 mg total) by mouth 2 (two) times daily. On 12/5 pt instructed to take two , 0.5 mg capsules bid  90 capsule  0  . aspirin 81 MG tablet Take 81 mg by mouth daily.        Marland Kitchen diltiazem (CARDIZEM CD) 180 MG 24 hr capsule TAKE ONE CAPSULE BY MOUTH DAILY  30 capsule  5  . esomeprazole (NEXIUM) 40 MG capsule Take 40 mg by mouth every other day.        Marland Kitchen FeFum-FePoly-FA-B Cmp-C-Biot (INTEGRA PLUS) CAPS Take 1 capsule by mouth daily.  30 capsule  2  . folic acid (FOLVITE) 1 MG tablet Take 1 mg by mouth daily.      . hydroxyurea (HYDREA) 500 MG capsule TAKE THREE CAPSULES BY MOUTH EVERY DAY. MAY TAKE WITH FOOD TO MINIMIZE GI SIDE EFFECTS  90 capsule  1  . NEXIUM 40 MG capsule TAKE ONE CAPSULE BY MOUTH DAILY BEFORE BREAKFAST  30 each  6    No Known Allergies  Family History  Problem Relation Age of Onset  . Ovarian cancer    . Uterine cancer    . Colitis      crohns  . Cancer Mother   . Cancer Father   . Transient ischemic attack Sister     Aortic valve replacement  . Colon cancer Neg Hx     History   Social History  . Marital Status:  Married    Spouse Name: N/A    Number of Children: 3  . Years of Education: N/A   Occupational History  . court reporter    Social History Main Topics  . Smoking status: Never Smoker   . Smokeless tobacco: Never Used  . Alcohol Use: No  . Drug Use: No  . Sexually Active: Not on file   Other Topics Concern  . Not on file   Social History Narrative  . No narrative on file     Constitutional: Pt reports headache. Denies fever, malaise, fatigue, or abrupt weight changes.  HEENT: Denies  Blurred vision, eye pain, eye redness, ear pain, ringing in the ears, wax buildup, runny nose, nasal congestion, bloody nose, or sore throat. Respiratory: Denies difficulty breathing, shortness of breath, cough or sputum production.   Cardiovascular: Denies chest pain, chest tightness, palpitations or swelling in the hands or feet.  Neurological: Denies dizziness, difficulty with memory, difficulty with speech or problems with balance and coordination.   No other specific complaints in a complete review of systems (  except as listed in HPI above).     Objective:   Physical Exam   BP 158/82  Pulse 76  Temp 98 F (36.7 C) (Oral)  Resp 16  Wt 150 lb (68.04 kg) Wt Readings from Last 3 Encounters:  02/08/12 150 lb (68.04 kg)  12/18/11 148 lb (67.132 kg)  10/18/11 149 lb 11.2 oz (67.903 kg)    General: Appears her stated age, well developed, well nourished in NAD. Cardiovascular: Normal rate and rhythm. S1,S2 noted.  No murmur, rubs or gallops noted. No JVD or BLE edema. No carotid bruits noted. Pulmonary/Chest: Normal effort and positive vesicular breath sounds. No respiratory distress. No wheezes, rales or ronchi noted.  Neurological: Alert and oriented. Cranial nerves II-XII intact. Coordination normal. +DTRs bilaterally.     Assessment & Plan:   Hypertension:  Avoid salt in your diet Continue to take the Cardizem Will add Lisinopril 10 mg  RTC in 3 weeks for blood pressure  check

## 2012-02-19 ENCOUNTER — Encounter: Payer: Self-pay | Admitting: Internal Medicine

## 2012-02-19 ENCOUNTER — Other Ambulatory Visit (HOSPITAL_BASED_OUTPATIENT_CLINIC_OR_DEPARTMENT_OTHER): Payer: Medicare Other

## 2012-02-19 ENCOUNTER — Other Ambulatory Visit: Payer: Self-pay | Admitting: Medical Oncology

## 2012-02-19 ENCOUNTER — Telehealth: Payer: Self-pay | Admitting: Internal Medicine

## 2012-02-19 ENCOUNTER — Telehealth: Payer: Self-pay | Admitting: Medical Oncology

## 2012-02-19 ENCOUNTER — Ambulatory Visit (HOSPITAL_BASED_OUTPATIENT_CLINIC_OR_DEPARTMENT_OTHER): Payer: Medicare Other | Admitting: Internal Medicine

## 2012-02-19 VITALS — BP 160/74 | HR 71 | Temp 97.0°F | Resp 20 | Ht 69.0 in | Wt 150.0 lb

## 2012-02-19 DIAGNOSIS — D473 Essential (hemorrhagic) thrombocythemia: Secondary | ICD-10-CM

## 2012-02-19 LAB — CBC WITH DIFFERENTIAL/PLATELET
BASO%: 0.5 % (ref 0.0–2.0)
Basophils Absolute: 0 10*3/uL (ref 0.0–0.1)
EOS%: 0.9 % (ref 0.0–7.0)
MCH: 37.4 pg — ABNORMAL HIGH (ref 27.2–33.4)
MCHC: 34 g/dL (ref 32.0–36.0)
MCV: 109.8 fL — ABNORMAL HIGH (ref 79.3–98.0)
MONO%: 8.4 % (ref 0.0–14.0)
RBC: 2.76 10*6/uL — ABNORMAL LOW (ref 4.20–5.82)
RDW: 15.3 % — ABNORMAL HIGH (ref 11.0–14.6)

## 2012-02-19 LAB — COMPREHENSIVE METABOLIC PANEL (CC13)
Alkaline Phosphatase: 66 U/L (ref 40–150)
BUN: 22 mg/dL (ref 7.0–26.0)
Creatinine: 1.1 mg/dL (ref 0.7–1.3)
Glucose: 95 mg/dl (ref 70–99)
Sodium: 144 mEq/L (ref 136–145)
Total Bilirubin: 0.39 mg/dL (ref 0.20–1.20)
Total Protein: 6.4 g/dL (ref 6.4–8.3)

## 2012-02-19 NOTE — Telephone Encounter (Signed)
PT has [facility] appt with Dr Malen Gauze on jan 29th -does Dr Arbutus Ped want CBC prior - per Dr Arbutus Ped schedule CBC in 2 weeks. Pt notified and appt made

## 2012-02-19 NOTE — Progress Notes (Signed)
[facility] Telephone:(336) [phone]   Fax:(336) [phone]  OFFICE PROGRESS NOTE  [name], MD [address] [place] [address]  DIAGNOSIS: Essential thrombocythemia.   PRIOR THERAPY: None   CURRENT THERAPY:  1) Hydroxyurea 1000 mg alternating with 1500 mg by mouth every other day.  2) Integra plus 1 capsule by mouth daily for iron deficiency.  3) aspirin 81 mg by mouth daily.  4) anagrelide 2.5 mg by mouth daily  INTERVAL HISTORY: Thomas Cherry 67 y.o. male returns to the clinic today for followup visit. The patient is feeling fine today with no specific complaints. He denied having any headache or blurry vision. He denied having any bleeding issues. He has no bruises or ecchymosis. The patient is tolerating his treatment with hydroxyurea as well as anagrelide fairly well with no significant adverse effects. He had repeat CBC performed earlier today and he is here for evaluation and discussion of his lab results.  MEDICAL HISTORY: Past Medical History  Diagnosis Date  . GERD (gastroesophageal reflux disease)   . Allergy     heyfever  . Hypertension   . Palpitation   . Elevated platelet count     on hydroxyurea  . Heart murmur     ALLERGIES:   has no known allergies.  MEDICATIONS:  Current Outpatient Prescriptions  Medication Sig Dispense Refill  . anagrelide (AGRYLIN) 0.5 MG capsule Take 2 mg by mouth daily. On 12/5 pt instructed to take two , 0.5 mg capsules bid-pt takes all at night      . aspirin 81 MG tablet Take 81 mg by mouth daily.        Marland Kitchen diltiazem (CARDIZEM CD) 180 MG 24 hr capsule TAKE ONE CAPSULE BY MOUTH DAILY  30 capsule  5  . esomeprazole (NEXIUM) 40 MG capsule Take 40 mg by mouth every other day.        Marland Kitchen FeFum-FePoly-FA-B Cmp-C-Biot (INTEGRA PLUS) CAPS Take 1 capsule by mouth daily.  30 capsule  2  . folic acid (FOLVITE) 1 MG tablet Take 1 mg by mouth daily.      . hydroxyurea (HYDREA) 500 MG capsule 500 mg.  02/19/11 pt taking 1000mg  alternate 1500 mg      . lisinopril (PRINIVIL,ZESTRIL) 10 MG tablet Take 1 tablet (10 mg total) by mouth daily.  30 tablet  0    SURGICAL HISTORY:  Past Surgical History  Procedure Date  . Hernia repair     REVIEW OF SYSTEMS:  A comprehensive review of systems was negative.   PHYSICAL EXAMINATION: General appearance: alert, cooperative and no distress Head: Normocephalic, without obvious abnormality, atraumatic Neck: no adenopathy Lymph nodes: Cervical, supraclavicular, and axillary nodes normal. Resp: clear to auscultation bilaterally Cardio: regular rate and rhythm, S1, S2 normal, no murmur, click, rub or gallop GI: soft, non-tender; bowel sounds normal; no masses,  no organomegaly Extremities: extremities normal, atraumatic, no cyanosis or edema  ECOG PERFORMANCE STATUS: 0 - Asymptomatic  There were no vitals taken for this visit.  LABORATORY DATA: Lab Results  Component Value Date   WBC 2.9* 02/19/2012   HGB 10.3* 02/19/2012   HCT 30.3* 02/19/2012   MCV 109.8* 02/19/2012   PLT 893* 02/19/2012      Chemistry      Component Value Date/Time   NA 140 12/18/2011 1033   NA 140 09/19/2011 1154   K 4.4 12/18/2011 1033   K 5.0 09/19/2011 1154   CL 109*  12/18/2011 1033   CL 107 09/19/2011 1154   CO2 27 12/18/2011 1033   CO2 28 09/19/2011 1154   BUN 23.0 12/18/2011 1033   BUN 26* 09/19/2011 1154   CREATININE 1.0 12/18/2011 1033   CREATININE 1.13 09/19/2011 1154      Component Value Date/Time   CALCIUM 9.1 12/18/2011 1033   CALCIUM 8.8 09/19/2011 1154   ALKPHOS 62 12/18/2011 1033   ALKPHOS 49 09/19/2011 1154   AST 17 12/18/2011 1033   AST 21 09/19/2011 1154   ALT 16 12/18/2011 1033   ALT 16 09/19/2011 1154   BILITOT 0.40 12/18/2011 1033   BILITOT 0.4 09/19/2011 1154       RADIOGRAPHIC STUDIES: No results found.  ASSESSMENT: This is a very pleasant 67 years old white male with history of essential thrombocythemia currently on treatment with hydroxyurea as well as anagrelide  but unfortunately his platelets count continues to increase.  PLAN: I discussed the lab result with the patient and give him a copy of his report. I recommended for him to increase her dose of anagrelide to 2.5 mg by mouth daily. He will continue on hydroxyurea 1000 mg alternating by 1500 mg every other day. I also recommended for the patient to see Dr. Berline Lopes at [facility] for reevaluation and discussion of any other available treatment options. One of the questions that can be asked to Dr. Malen Gauze if the patient would be a good candidate for treatment with Earvin Hansen which is not currently approved for treatment of essential thrombocythemia. The patient will continue to have repeat CBC every 2 weeks and I would see him back for followup visit in one month for reevaluation. He was advised to call me immediately if he has any concerning symptoms in the interval  All questions were answered. The patient knows to call the clinic with any problems, questions or concerns. We can certainly see the patient much sooner if necessary.

## 2012-02-19 NOTE — Patient Instructions (Signed)
Your platelets count continues to increase. I recommended increasing the dose of anagrelide to 2.5 mg by mouth daily. Followup in one month.

## 2012-02-19 NOTE — Telephone Encounter (Signed)
appts made and printed for pt aom °

## 2012-02-22 ENCOUNTER — Ambulatory Visit (INDEPENDENT_AMBULATORY_CARE_PROVIDER_SITE_OTHER): Payer: Medicare Other | Admitting: Endocrinology

## 2012-02-22 VITALS — BP 163/75 | HR 90 | Wt 148.0 lb

## 2012-02-22 DIAGNOSIS — I10 Essential (primary) hypertension: Secondary | ICD-10-CM

## 2012-02-22 MED ORDER — LOSARTAN POTASSIUM-HCTZ 50-12.5 MG PO TABS: 1.0000 | ORAL_TABLET | Freq: Every day | ORAL | Status: DC

## 2012-02-22 NOTE — Patient Instructions (Addendum)
Chang the lisinopril to "losartan-hctz."  i have sent a prescription to your pharmacy Please come back for a "medicare wellness" visit in 1 month.

## 2012-02-22 NOTE — Progress Notes (Signed)
  Subjective:    Patient ID: Thomas Cherry, male    DOB: 07-Dec-1945, 67 y.o.   MRN: [id number]  HPI Pt returns for f/u of HTN (dx'ed 2010).  He noted bp to be high, and zestril was added.  pt states he feels well in general.  He has 1 month of intermittent slight palpitations in the chest, but no assoc sob. Past Medical History  Diagnosis Date  . GERD (gastroesophageal reflux disease)   . Allergy     heyfever  . Hypertension   . Palpitation   . Elevated platelet count     on hydroxyurea  . Heart murmur     Past Surgical History  Procedure Date  . Hernia repair     History   Social History  . Marital Status: Married    Spouse Name: N/A    Number of Children: 3  . Years of Education: N/A   Occupational History  . court reporter    Social History Main Topics  . Smoking status: Never Smoker   . Smokeless tobacco: Never Used  . Alcohol Use: No  . Drug Use: No  . Sexually Active: Not on file   Other Topics Concern  . Not on file   Social History Narrative  . No narrative on file    Current Outpatient Prescriptions on File Prior to Visit  Medication Sig Dispense Refill  . anagrelide (AGRYLIN) 0.5 MG capsule Take 2 mg by mouth daily. On 12/5 pt instructed to take two , 0.5 mg capsules bid-pt takes all at night      . aspirin 81 MG tablet Take 81 mg by mouth daily.        Marland Kitchen diltiazem (CARDIZEM CD) 180 MG 24 hr capsule TAKE ONE CAPSULE BY MOUTH DAILY  30 capsule  5  . esomeprazole (NEXIUM) 40 MG capsule Take 40 mg by mouth every other day.        Marland Kitchen FeFum-FePoly-FA-B Cmp-C-Biot (INTEGRA PLUS) CAPS Take 1 capsule by mouth daily.  30 capsule  2  . folic acid (FOLVITE) 1 MG tablet Take 1 mg by mouth daily.      . hydroxyurea (HYDREA) 500 MG capsule 500 mg. 02/19/11 pt taking 1000mg  alternate 1500 mg      . losartan-hydrochlorothiazide (HYZAAR) 50-12.5 MG per tablet Take 1 tablet by mouth daily.  30 tablet  11    No Known Allergies  Family History  Problem Relation Age  of Onset  . Ovarian cancer    . Uterine cancer    . Colitis      crohns  . Cancer Mother   . Cancer Father   . Transient ischemic attack Sister     Aortic valve replacement  . Colon cancer Neg Hx     BP 163/75  Pulse 90  Wt 148 lb (67.132 kg)  SpO2 92%   Review of Systems He has a dry cough, but no chest pain    Objective:   Physical Exam VITAL SIGNS:  See vs page GENERAL: no distress LUNGS:  Clear to auscultation HEART:  Regular rate and rhythm without murmurs noted. Normal S1,S2.     i reviewed electrocardiogram    Assessment & Plan:  HTN, needs increased rx Palpitations, chronic intermittent, uncertain etiology. Cough, possibly due to acei

## 2012-02-26 ENCOUNTER — Other Ambulatory Visit: Payer: Self-pay | Admitting: Internal Medicine

## 2012-02-26 DIAGNOSIS — D473 Essential (hemorrhagic) thrombocythemia: Secondary | ICD-10-CM

## 2012-02-28 ENCOUNTER — Other Ambulatory Visit: Payer: Self-pay | Admitting: Medical Oncology

## 2012-02-28 ENCOUNTER — Other Ambulatory Visit: Payer: Self-pay | Admitting: Internal Medicine

## 2012-02-28 DIAGNOSIS — D473 Essential (hemorrhagic) thrombocythemia: Secondary | ICD-10-CM

## 2012-02-28 MED ORDER — ANAGRELIDE HCL 0.5 MG PO CAPS: 2.5000 mg | ORAL_CAPSULE | Freq: Every day | ORAL | Status: DC

## 2012-02-28 NOTE — Telephone Encounter (Signed)
Anagrelide dose increased to 2.5 mg daily- pt has 0.5 mg capsules and understands dose instructions.

## 2012-02-29 ENCOUNTER — Ambulatory Visit: Payer: Medicare Other | Admitting: Internal Medicine

## 2012-03-04 ENCOUNTER — Telehealth: Payer: Self-pay | Admitting: *Deleted

## 2012-03-04 ENCOUNTER — Other Ambulatory Visit (HOSPITAL_BASED_OUTPATIENT_CLINIC_OR_DEPARTMENT_OTHER): Payer: Medicare Other

## 2012-03-04 DIAGNOSIS — D473 Essential (hemorrhagic) thrombocythemia: Secondary | ICD-10-CM

## 2012-03-04 LAB — CBC WITH DIFFERENTIAL/PLATELET
BASO%: 0.4 % (ref 0.0–2.0)
Basophils Absolute: 0 10*3/uL (ref 0.0–0.1)
HCT: 31.6 % — ABNORMAL LOW (ref 38.4–49.9)
HGB: 10.5 g/dL — ABNORMAL LOW (ref 13.0–17.1)
LYMPH%: 35.4 % (ref 14.0–49.0)
MCH: 35.7 pg — ABNORMAL HIGH (ref 27.2–33.4)
MCHC: 33.1 g/dL (ref 32.0–36.0)
MONO#: 0.3 10*3/uL (ref 0.1–0.9)
NEUT%: 54.4 % (ref 39.0–75.0)
Platelets: 877 10*3/uL — ABNORMAL HIGH (ref 140–400)
WBC: 3.5 10*3/uL — ABNORMAL LOW (ref 4.0–10.3)
lymph#: 1.3 10*3/uL (ref 0.9–3.3)

## 2012-03-04 NOTE — Telephone Encounter (Signed)
Called pt and informed him that Dr Donnald Garre would not change his medication at this time based on CBC results today.  Pt verbalized understanding.  SLJ

## 2012-03-11 ENCOUNTER — Telehealth: Payer: Self-pay | Admitting: Internal Medicine

## 2012-03-11 NOTE — Telephone Encounter (Signed)
Faxed pt medical records to [facility] °

## 2012-03-18 ENCOUNTER — Telehealth: Payer: Self-pay | Admitting: Internal Medicine

## 2012-03-18 ENCOUNTER — Other Ambulatory Visit (HOSPITAL_BASED_OUTPATIENT_CLINIC_OR_DEPARTMENT_OTHER): Payer: Medicare Other

## 2012-03-18 ENCOUNTER — Ambulatory Visit (HOSPITAL_BASED_OUTPATIENT_CLINIC_OR_DEPARTMENT_OTHER): Payer: Medicare Other | Admitting: Internal Medicine

## 2012-03-18 ENCOUNTER — Encounter: Payer: Self-pay | Admitting: Internal Medicine

## 2012-03-18 VITALS — BP 145/75 | HR 72 | Temp 97.0°F | Resp 20 | Ht 69.0 in | Wt 148.9 lb

## 2012-03-18 DIAGNOSIS — D473 Essential (hemorrhagic) thrombocythemia: Secondary | ICD-10-CM

## 2012-03-18 LAB — COMPREHENSIVE METABOLIC PANEL (CC13)
AST: 19 U/L (ref 5–34)
Albumin: 3.3 g/dL — ABNORMAL LOW (ref 3.5–5.0)
Alkaline Phosphatase: 70 U/L (ref 40–150)
BUN: 25.6 mg/dL (ref 7.0–26.0)
Creatinine: 1.1 mg/dL (ref 0.7–1.3)
Glucose: 89 mg/dl (ref 70–99)
Total Bilirubin: 0.38 mg/dL (ref 0.20–1.20)

## 2012-03-18 LAB — CBC WITH DIFFERENTIAL/PLATELET
BASO%: 0.4 % (ref 0.0–2.0)
Basophils Absolute: 0 10*3/uL (ref 0.0–0.1)
EOS%: 1.1 % (ref 0.0–7.0)
HCT: 31.6 % — ABNORMAL LOW (ref 38.4–49.9)
HGB: 10.7 g/dL — ABNORMAL LOW (ref 13.0–17.1)
LYMPH%: 28.7 % (ref 14.0–49.0)
MCH: 37.2 pg — ABNORMAL HIGH (ref 27.2–33.4)
MCHC: 34 g/dL (ref 32.0–36.0)
MCV: 109.4 fL — ABNORMAL HIGH (ref 79.3–98.0)
MONO%: 8.6 % (ref 0.0–14.0)
NEUT%: 61.2 % (ref 39.0–75.0)
lymph#: 1.1 10*3/uL (ref 0.9–3.3)

## 2012-03-18 NOTE — Progress Notes (Signed)
[facility] Telephone:(336) [phone]   Fax:(336) [phone]  OFFICE PROGRESS NOTE  [name], MD [address] [place] [address]  DIAGNOSIS: Essential thrombocythemia.   PRIOR THERAPY:   1) Hydroxyurea 1000 mg alternating with 1500 mg by mouth every other day. 2) anagrelide 2.5 mg by mouth daily discontinued recently by Dr. Malen Gauze at [facility].  CURRENT THERAPY:  1) Hydroxyurea 1500 mg by mouth every day.  2) Integra plus 1 capsule by mouth daily for iron deficiency.  3) aspirin 81 mg by mouth daily.   INTERVAL HISTORY: Thomas Cherry 67 y.o. male returns to the clinic today for routine followup visit. The patient is feeling fine with no specific complaints. He denied having any headache or blurry vision. He denied having any chest pain, shortness breath, cough or hemoptysis. The patient denied having any significant weight loss or night sweats. He has no bleeding issues. He was referred again to Dr. Malen Gauze at [facility] for a second opinion regarding his elevated platelets count. Dr. Malen Gauze recommended increasing the dose of Hydrea to 1500 mg daily which was started on 03/10/2012 and discontinuing anagrelide for now. The patient has repeat CBC performed earlier today and he is here for evaluation and discussion of his lab results.  MEDICAL HISTORY: Past Medical History  Diagnosis Date  . GERD (gastroesophageal reflux disease)   . Allergy     heyfever  . Hypertension   . Palpitation   . Elevated platelet count     on hydroxyurea  . Heart murmur     ALLERGIES:   has no known allergies.  MEDICATIONS:  Current Outpatient Prescriptions  Medication Sig Dispense Refill  . aspirin 81 MG tablet Take 81 mg by mouth daily.        Marland Kitchen diltiazem (CARDIZEM CD) 180 MG 24 hr capsule TAKE ONE CAPSULE BY MOUTH DAILY  30 capsule  5  . esomeprazole (NEXIUM) 40 MG capsule Take 40 mg by mouth every other day.        Marland Kitchen FeFum-FePoly-FA-B  Cmp-C-Biot (INTEGRA PLUS) CAPS TAKE ONE CAPSULE BY MOUTH DAILY.  30 each  1  . fluocinonide (LIDEX) 0.05 % external solution Apply topically Twice daily as needed.      . folic acid (FOLVITE) 1 MG tablet Take 1 mg by mouth daily.      Marland Kitchen losartan-hydrochlorothiazide (HYZAAR) 50-12.5 MG per tablet Take 1 tablet by mouth daily.  30 tablet  11  . hydroxyurea (HYDREA) 500 MG capsule 500 mg. 1500 mg daily        SURGICAL HISTORY:  Past Surgical History  Procedure Date  . Hernia repair     REVIEW OF SYSTEMS:  A comprehensive review of systems was negative.   PHYSICAL EXAMINATION: General appearance: alert, cooperative and no distress Head: Normocephalic, without obvious abnormality, atraumatic Neck: no adenopathy Lymph nodes: Cervical, supraclavicular, and axillary nodes normal. Resp: clear to auscultation bilaterally Cardio: regular rate and rhythm, S1, S2 normal, no murmur, click, rub or gallop GI: soft, non-tender; bowel sounds normal; no masses,  no organomegaly Extremities: extremities normal, atraumatic, no cyanosis or edema  ECOG PERFORMANCE STATUS: 0 - Asymptomatic  Blood pressure 145/75, pulse 72, temperature 97 F (36.1 C), temperature source Oral, resp. rate 20, height 5\' 9"  (1.753 m), weight 148 lb 14.4 oz (67.541 kg).  LABORATORY DATA: Lab Results  Component Value Date   WBC 3.7* 03/18/2012   HGB 10.7* 03/18/2012  HCT 31.6* 03/18/2012   MCV 109.4* 03/18/2012   PLT 993* 03/18/2012      Chemistry      Component Value Date/Time   NA 144 02/19/2012 1048   NA 140 09/19/2011 1154   K 4.0 02/19/2012 1048   K 5.0 09/19/2011 1154   CL 111* 02/19/2012 1048   CL 107 09/19/2011 1154   CO2 27 02/19/2012 1048   CO2 28 09/19/2011 1154   BUN 22.0 02/19/2012 1048   BUN 26* 09/19/2011 1154   CREATININE 1.1 02/19/2012 1048   CREATININE 1.13 09/19/2011 1154      Component Value Date/Time   CALCIUM 9.1 02/19/2012 1048   CALCIUM 8.8 09/19/2011 1154   ALKPHOS 66 02/19/2012 1048   ALKPHOS 49 09/19/2011 1154   AST  19 02/19/2012 1048   AST 21 09/19/2011 1154   ALT 15 02/19/2012 1048   ALT 16 09/19/2011 1154   BILITOT 0.39 02/19/2012 1048   BILITOT 0.4 09/19/2011 1154       RADIOGRAPHIC STUDIES: No results found.  ASSESSMENT: This is a very pleasant 67 years old white male with essential thrombocythemia currently on treatment with hydroxyurea 1500 mg by mouth daily. The patient is currently asymptomatic but his platelets counts still elevated. He mentioned that it was much higher when he saw Dr. Malen Gauze one week ago.  PLAN: I recommended for him to continue on the current dose of hydroxyurea 1500 mg by mouth daily for now. I will continue to monitor his CBC on a weekly basis and adjust his dose as needed. He would come back for followup visit in one month. He was advised to call immediately if he has any concerning symptoms in the interval.  All questions were answered. The patient knows to call the clinic with any problems, questions or concerns. We can certainly see the patient much sooner if necessary.

## 2012-03-18 NOTE — Patient Instructions (Signed)
Continue hydroxyurea for now. I will monitor your CBC on a weekly basis. Followup in one month.

## 2012-03-18 NOTE — Telephone Encounter (Signed)
gv and printed appt schedule for Feb and March...pt ojk

## 2012-03-24 ENCOUNTER — Ambulatory Visit (INDEPENDENT_AMBULATORY_CARE_PROVIDER_SITE_OTHER): Payer: Medicare Other | Admitting: Endocrinology

## 2012-03-24 ENCOUNTER — Other Ambulatory Visit: Payer: Self-pay | Admitting: Internal Medicine

## 2012-03-24 VITALS — BP 116/68 | HR 76 | Wt 148.0 lb

## 2012-03-24 DIAGNOSIS — I10 Essential (primary) hypertension: Secondary | ICD-10-CM

## 2012-03-24 DIAGNOSIS — E7800 Pure hypercholesterolemia, unspecified: Secondary | ICD-10-CM

## 2012-03-24 DIAGNOSIS — Z125 Encounter for screening for malignant neoplasm of prostate: Secondary | ICD-10-CM

## 2012-03-24 DIAGNOSIS — L219 Seborrheic dermatitis, unspecified: Secondary | ICD-10-CM

## 2012-03-24 DIAGNOSIS — Z0000 Encounter for general adult medical examination without abnormal findings: Secondary | ICD-10-CM

## 2012-03-24 LAB — LIPID PANEL
Cholesterol: 145 mg/dL (ref 0–200)
HDL: 54 mg/dL (ref >39.00)
Triglycerides: 81 mg/dL (ref 0.0–149.0)
VLDL: 16.2 mg/dL (ref 0.0–40.0)

## 2012-03-24 LAB — URINALYSIS, ROUTINE W REFLEX MICROSCOPIC
Bilirubin Urine: NEGATIVE
Ketones, ur: NEGATIVE
Leukocytes, UA: NEGATIVE
Specific Gravity, Urine: 1.015 (ref 1.000–1.030)
Total Protein, Urine: NEGATIVE
pH: 7 (ref 5.0–8.0)

## 2012-03-24 LAB — PSA, MEDICARE: PSA: 3 ng/ml (ref 0.10–4.00)

## 2012-03-24 MED ORDER — LOSARTAN POTASSIUM-HCTZ 50-12.5 MG PO TABS: ORAL_TABLET | ORAL | Status: DC

## 2012-03-24 NOTE — Progress Notes (Signed)
Subjective:    Patient ID: Thomas Cherry, male    DOB: 12/03/1945, 67 y.o.   MRN: [id number]  HPI The state of at least three ongoing medical problems is addressed today, with interval history of each noted here: Palpitations have resolved. HTN: pt reports slight lightheadedness. Dyslipidemia: he denies chest pain. Past Medical History  Diagnosis Date  . GERD (gastroesophageal reflux disease)   . Allergy     heyfever  . Hypertension   . Palpitation   . Elevated platelet count     on hydroxyurea  . Heart murmur     Past Surgical History  Procedure Laterality Date  . Hernia repair      History   Social History  . Marital Status: Married    Spouse Name: N/A    Number of Children: 3  . Years of Education: N/A   Occupational History  . court reporter    Social History Main Topics  . Smoking status: Never Smoker   . Smokeless tobacco: Never Used  . Alcohol Use: No  . Drug Use: No  . Sexually Active: Not on file   Other Topics Concern  . Not on file   Social History Narrative  . No narrative on file    Current Outpatient Prescriptions on File Prior to Visit  Medication Sig Dispense Refill  . aspirin 81 MG tablet Take 81 mg by mouth daily.        Marland Kitchen diltiazem (CARDIZEM CD) 180 MG 24 hr capsule TAKE ONE CAPSULE BY MOUTH DAILY  30 capsule  5  . esomeprazole (NEXIUM) 40 MG capsule Take 40 mg by mouth every other day.        Marland Kitchen FeFum-FePoly-FA-B Cmp-C-Biot (INTEGRA PLUS) CAPS TAKE ONE CAPSULE BY MOUTH DAILY.  30 each  1  . fluocinonide (LIDEX) 0.05 % external solution Apply topically Twice daily as needed.      . folic acid (FOLVITE) 1 MG tablet Take 1 mg by mouth daily.      . hydroxyurea (HYDREA) 500 MG capsule 500 mg. 1500 mg daily       No current facility-administered medications on file prior to visit.    No Known Allergies  Family History  Problem Relation Age of Onset  . Ovarian cancer    . Uterine cancer    . Colitis      crohns  . Cancer Mother    . Cancer Father   . Transient ischemic attack Sister     Aortic valve replacement  . Colon cancer Neg Hx     BP 116/68  Pulse 76  Wt 148 lb (67.132 kg)  BMI 21.85 kg/m2  SpO2 99%   Review of Systems Denies chest pain and decreased urinary stream    Objective:   Physical Exam VITAL SIGNS:  See vs page GENERAL: no distress LUNGS:  Clear to auscultation HEART:  Regular rate and rhythm without murmurs noted. Normal S1,S2.     Lab Results  Component Value Date   WBC 3.9* 03/25/2012   HGB 10.5* 03/25/2012   HCT 30.6* 03/25/2012   PLT 970* 03/25/2012   GLUCOSE 80 03/25/2012   CHOL 145 03/24/2012   TRIG 81.0 03/24/2012   HDL 54.00 03/24/2012   LDLCALC 75 03/24/2012   ALT 14 03/25/2012   AST 18 03/25/2012   NA 143 03/25/2012   K 3.9 03/25/2012   CL 106 03/25/2012   CREATININE 1.2 03/25/2012   BUN 31.6* 03/25/2012   CO2 30* 03/25/2012  TSH 1.18 03/24/2012   PSA 3.00 03/24/2012      Assessment & Plan:  HTN, slightly overcontrolled Palpitations, uncertain etiology, resolved Dyslipidemia, well-controlled   Subjective:   Patient here for Medicare annual wellness visit and management of other chronic and acute problems.     Risk factors: advanced age    Roster of Physicians Providing Medical Care to Patient:  See "snapshot"   Activities of Daily Living: In your present state of health, do you have any difficulty performing the following activities?:  Preparing food and eating?: No  Bathing yourself: No  Getting dressed: No  Using the toilet:No  Moving around from place to place: No  In the past year have you fallen or had a near fall?: No    Home Safety: Has smoke detector and wears seat belts. No firearms. No excess sun exposure.  Diet and Exercise  Current exercise habits: pt says good Dietary issues discussed: pt reports a healthy diet   Depression Screen  Q1: Over the past two weeks, have you felt down, depressed or hopeless? no  Q2: Over the past two weeks, have you  felt little interest or pleasure in doing things? no   The following portions of the patient's history were reviewed and updated as appropriate: allergies, current medications, past family history, past medical history, past social history, past surgical history and problem list.  Past Medical History  Diagnosis Date  . GERD (gastroesophageal reflux disease)   . Allergy     heyfever  . Hypertension   . Palpitation   . Elevated platelet count     on hydroxyurea  . Heart murmur     Past Surgical History  Procedure Laterality Date  . Hernia repair      History   Social History  . Marital Status: Married    Spouse Name: N/A    Number of Children: 3  . Years of Education: N/A   Occupational History  . court reporter    Social History Main Topics  . Smoking status: Never Smoker   . Smokeless tobacco: Never Used  . Alcohol Use: No  . Drug Use: No  . Sexually Active: Not on file   Other Topics Concern  . Not on file   Social History Narrative  . No narrative on file    Current Outpatient Prescriptions on File Prior to Visit  Medication Sig Dispense Refill  . aspirin 81 MG tablet Take 81 mg by mouth daily.        Marland Kitchen diltiazem (CARDIZEM CD) 180 MG 24 hr capsule TAKE ONE CAPSULE BY MOUTH DAILY  30 capsule  5  . esomeprazole (NEXIUM) 40 MG capsule Take 40 mg by mouth every other day.        Marland Kitchen FeFum-FePoly-FA-B Cmp-C-Biot (INTEGRA PLUS) CAPS TAKE ONE CAPSULE BY MOUTH DAILY.  30 each  1  . fluocinonide (LIDEX) 0.05 % external solution Apply topically Twice daily as needed.      . folic acid (FOLVITE) 1 MG tablet Take 1 mg by mouth daily.      . hydroxyurea (HYDREA) 500 MG capsule 500 mg. 1500 mg daily      . losartan-hydrochlorothiazide (HYZAAR) 50-12.5 MG per tablet Take 1 tablet by mouth daily.  30 tablet  11   No current facility-administered medications on file prior to visit.    No Known Allergies  Family History  Problem Relation Age of Onset  . Ovarian cancer     . Uterine cancer    .  Colitis      crohns  . Cancer Mother   . Cancer Father   . Transient ischemic attack Sister     Aortic valve replacement  . Colon cancer Neg Hx     BP 116/68  Pulse 76  Wt 148 lb (67.132 kg)  BMI 21.85 kg/m2  SpO2 99%   Review of Systems  Denies hearing loss, and visual loss Objective:   Vision:  Sees opthalmologist Hearing: grossly normal Body mass index:  See vs page Msk: pt easily and quickly performs "get-up-and-go" from a sitting position Cognitive Impairment Assessment: cognition, memory and judgment appear normal.  remembers 3/3 at 5 minutes.  excellent recall.  can easily read and write a sentence.  alert and oriented x 3  Assessment:   Medicare wellness utd on preventive parameters    Plan:   During the course of the visit the patient was educated and counseled about appropriate screening and preventive services including:       Fall prevention    Diabetes screening  Nutrition counseling   Vaccines / LABS Zostavax / Pnemonccoal Vaccine  today  PSA  Patient Instructions (the written plan) was given to the patient.    we discussed code status.  pt requests full code, but would not want to be started or maintained on artificial life-support measures if there was not a reasonable chance of recovery

## 2012-03-24 NOTE — Patient Instructions (Addendum)
blood tests are being requested for you today.  We'll contact you with results. please consider these measures for your health:  minimize alcohol.  do not use tobacco products.  have a colonoscopy at least every 10 years from age 67.  keep firearms safely stored.  always use seat belts.  have working smoke alarms in your home.  see an eye doctor and dentist regularly.  never drive under the influence of alcohol or drugs (including prescription drugs).  those with fair skin should take precautions against the sun. Please return in 1 year.   Please reduce the losartan-hctz to 1/2 pill per day.

## 2012-03-25 ENCOUNTER — Other Ambulatory Visit (HOSPITAL_BASED_OUTPATIENT_CLINIC_OR_DEPARTMENT_OTHER): Payer: Medicare Other

## 2012-03-25 DIAGNOSIS — D473 Essential (hemorrhagic) thrombocythemia: Secondary | ICD-10-CM

## 2012-03-25 LAB — CBC WITH DIFFERENTIAL/PLATELET
Eosinophils Absolute: 0 10*3/uL (ref 0.0–0.5)
HCT: 30.6 % — ABNORMAL LOW (ref 38.4–49.9)
LYMPH%: 21.1 % (ref 14.0–49.0)
MONO#: 0.3 10*3/uL (ref 0.1–0.9)
NEUT#: 2.8 10*3/uL (ref 1.5–6.5)
NEUT%: 70 % (ref 39.0–75.0)
Platelets: 970 10*3/uL — ABNORMAL HIGH (ref 140–400)
WBC: 3.9 10*3/uL — ABNORMAL LOW (ref 4.0–10.3)

## 2012-03-25 LAB — COMPREHENSIVE METABOLIC PANEL (CC13)
CO2: 30 mEq/L — ABNORMAL HIGH (ref 22–29)
Creatinine: 1.2 mg/dL (ref 0.7–1.3)
Glucose: 80 mg/dl (ref 70–99)
Sodium: 143 mEq/L (ref 136–145)
Total Bilirubin: 0.4 mg/dL (ref 0.20–1.20)
Total Protein: 6.6 g/dL (ref 6.4–8.3)

## 2012-04-01 ENCOUNTER — Other Ambulatory Visit: Payer: Medicare Other

## 2012-04-01 ENCOUNTER — Telehealth: Payer: Self-pay | Admitting: Internal Medicine

## 2012-04-02 ENCOUNTER — Other Ambulatory Visit: Payer: BLUE CROSS/BLUE SHIELD

## 2012-04-02 LAB — CBC WITH DIFFERENTIAL/PLATELET
Eosinophils Absolute: 0 10*3/uL (ref 0.0–0.5)
LYMPH%: 39.1 % (ref 14.0–49.0)
MONO#: 0.3 10*3/uL (ref 0.1–0.9)
NEUT#: 1.3 10*3/uL — ABNORMAL LOW (ref 1.5–6.5)
Platelets: 745 10*3/uL — ABNORMAL HIGH (ref 140–400)
RBC: 2.71 10*6/uL — ABNORMAL LOW (ref 4.20–5.82)
WBC: 2.7 10*3/uL — ABNORMAL LOW (ref 4.0–10.3)
lymph#: 1.1 10*3/uL (ref 0.9–3.3)

## 2012-04-08 ENCOUNTER — Other Ambulatory Visit (HOSPITAL_BASED_OUTPATIENT_CLINIC_OR_DEPARTMENT_OTHER): Payer: Medicare Other

## 2012-04-08 DIAGNOSIS — D473 Essential (hemorrhagic) thrombocythemia: Secondary | ICD-10-CM

## 2012-04-08 LAB — COMPREHENSIVE METABOLIC PANEL (CC13)
CO2: 28 mEq/L (ref 22–29)
Calcium: 8.8 mg/dL (ref 8.4–10.4)
Chloride: 107 mEq/L (ref 98–107)
Creatinine: 1.1 mg/dL (ref 0.7–1.3)
Glucose: 96 mg/dl (ref 70–99)
Total Bilirubin: 0.47 mg/dL (ref 0.20–1.20)

## 2012-04-08 LAB — CBC WITH DIFFERENTIAL/PLATELET
Basophils Absolute: 0 10*3/uL (ref 0.0–0.1)
Eosinophils Absolute: 0 10*3/uL (ref 0.0–0.5)
HCT: 29 % — ABNORMAL LOW (ref 38.4–49.9)
HGB: 10 g/dL — ABNORMAL LOW (ref 13.0–17.1)
LYMPH%: 32.3 % (ref 14.0–49.0)
MCHC: 34.4 g/dL (ref 32.0–36.0)
MONO#: 0.2 10*3/uL (ref 0.1–0.9)
NEUT#: 1.8 10*3/uL (ref 1.5–6.5)
NEUT%: 58.9 % (ref 39.0–75.0)
Platelets: 845 10*3/uL — ABNORMAL HIGH (ref 140–400)
WBC: 3 10*3/uL — ABNORMAL LOW (ref 4.0–10.3)

## 2012-04-15 ENCOUNTER — Encounter: Payer: Self-pay | Admitting: Internal Medicine

## 2012-04-15 ENCOUNTER — Other Ambulatory Visit: Payer: Medicare Other | Admitting: Lab

## 2012-04-15 ENCOUNTER — Other Ambulatory Visit (HOSPITAL_BASED_OUTPATIENT_CLINIC_OR_DEPARTMENT_OTHER): Payer: Medicare Other

## 2012-04-15 ENCOUNTER — Telehealth: Payer: Self-pay | Admitting: Oncology

## 2012-04-15 ENCOUNTER — Ambulatory Visit (HOSPITAL_BASED_OUTPATIENT_CLINIC_OR_DEPARTMENT_OTHER): Payer: Medicare Other | Admitting: Internal Medicine

## 2012-04-15 VITALS — BP 137/76 | HR 71 | Temp 97.3°F | Resp 20 | Ht 69.0 in | Wt 146.6 lb

## 2012-04-15 LAB — COMPREHENSIVE METABOLIC PANEL WITH GFR
ALT: <5 U/L (ref 0–53)
AST: 28 U/L (ref 0–37)
Albumin: 3.4 g/dL — ABNORMAL LOW (ref 3.5–5.2)
Alkaline Phosphatase: 57 U/L (ref 39–117)
BUN: 27 mg/dL — ABNORMAL HIGH (ref 6–23)
CO2: 27 meq/L (ref 19–32)
Calcium: 8.6 mg/dL (ref 8.4–10.5)
Chloride: 110 meq/L (ref 96–112)
Creatinine, Ser: 1.03 mg/dL (ref 0.50–1.35)
Glucose, Bld: 82 mg/dL (ref 70–99)
Potassium: 4.8 meq/L (ref 3.5–5.3)
Sodium: 145 meq/L (ref 135–145)
Total Bilirubin: 0.3 mg/dL (ref 0.3–1.2)
Total Protein: 6.4 g/dL (ref 6.0–8.3)

## 2012-04-15 LAB — CBC WITH DIFFERENTIAL/PLATELET
BASO%: 1 % (ref 0.0–2.0)
Eosinophils Absolute: 0 10*3/uL (ref 0.0–0.5)
HCT: 29.6 % — ABNORMAL LOW (ref 38.4–49.9)
LYMPH%: 39.2 % (ref 14.0–49.0)
MONO#: 0.2 10*3/uL (ref 0.1–0.9)
NEUT#: 1 10*3/uL — ABNORMAL LOW (ref 1.5–6.5)
NEUT%: 50 % (ref 39.0–75.0)
Platelets: 837 10*3/uL — ABNORMAL HIGH (ref 140–400)
RBC: 2.71 10*6/uL — ABNORMAL LOW (ref 4.20–5.82)
WBC: 2 10*3/uL — ABNORMAL LOW (ref 4.0–10.3)
lymph#: 0.8 10*3/uL — ABNORMAL LOW (ref 0.9–3.3)

## 2012-04-15 NOTE — Patient Instructions (Signed)
Platelets counts still elevated but stable. Continue hydroxyurea 1500 mg by mouth daily Weekly CBC Followup in one month

## 2012-04-15 NOTE — Progress Notes (Signed)
[facility] Telephone:(336) [phone]   Fax:([phone]) [phone]  OFFICE PROGRESS NOTE  [name], MD [address] [place] [address]  DIAGNOSIS: Essential thrombocythemia.   PRIOR THERAPY:  1) Hydroxyurea 1000 mg alternating with 1500 mg by mouth every other day.  2) anagrelide 2.5 mg by mouth daily discontinued recently by Dr. Malen Gauze at [facility].   CURRENT THERAPY:  1) Hydroxyurea 1500 mg by mouth every day.  2) Integra plus 1 capsule by mouth daily for iron deficiency.  3) aspirin 81 mg by mouth daily.    INTERVAL HISTORY: Thomas Cherry 67 y.o. male returns to the clinic today for followup visit. The patient is feeling fine with no specific complaints. He denied having any significant weight loss or night sweats. He denied having any headache or blurry vision. He has no chest pain, shortness breath, cough or hemoptysis. He is tolerating his treatment with hydroxyurea 1500 mg by mouth daily fairly well with no significant adverse effects. He has repeat CBC earlier today and he is here for evaluation and discussion of his lab results.  MEDICAL HISTORY: Past Medical History  Diagnosis Date  . GERD (gastroesophageal reflux disease)   . Allergy     heyfever  . Hypertension   . Palpitation   . Elevated platelet count     on hydroxyurea  . Heart murmur     ALLERGIES:  has No Known Allergies.  MEDICATIONS:  Current Outpatient Prescriptions  Medication Sig Dispense Refill  . aspirin 81 MG tablet Take 81 mg by mouth daily.        Marland Kitchen diltiazem (CARDIZEM CD) 180 MG 24 hr capsule TAKE ONE CAPSULE BY MOUTH DAILY  30 capsule  5  . esomeprazole (NEXIUM) 40 MG capsule Take 40 mg by mouth every other day.        Marland Kitchen FeFum-FePoly-FA-B Cmp-C-Biot (INTEGRA PLUS) CAPS TAKE ONE CAPSULE BY MOUTH DAILY.  30 each  1  . fluocinonide (LIDEX) 0.05 % external solution Apply topically Twice daily as needed.      . folic acid (FOLVITE) 1 MG tablet Take 1 mg  by mouth daily.      . hydroxyurea (HYDREA) 500 MG capsule TAKE 3 CAPSULE BY MOUTH EVERY DAY. MAY TAKE WITH FOOD TO MINIMIZE GI SIDE EFFECTS  90 capsule  0  . losartan-hydrochlorothiazide (HYZAAR) 50-12.5 MG per tablet 1/2 tab daily  15 tablet  11   No current facility-administered medications for this visit.    SURGICAL HISTORY:  Past Surgical History  Procedure Laterality Date  . Hernia repair      REVIEW OF SYSTEMS:  A comprehensive review of systems was negative.   PHYSICAL EXAMINATION: General appearance: alert, cooperative and no distress Head: Normocephalic, without obvious abnormality, atraumatic Neck: no adenopathy Lymph nodes: Cervical, supraclavicular, and axillary nodes normal. Resp: clear to auscultation bilaterally Cardio: regular rate and rhythm, S1, S2 normal, no murmur, click, rub or gallop GI: soft, non-tender; bowel sounds normal; no masses,  no organomegaly Extremities: extremities normal, atraumatic, no cyanosis or edema  ECOG PERFORMANCE STATUS: 0 - Asymptomatic  Blood pressure 137/76, pulse 71, temperature 97.3 F (36.3 C), temperature source Oral, resp. rate 20, height 5\' 9"  (1.753 m), weight 146 lb 9.6 oz (66.497 kg).  LABORATORY DATA: Lab Results  Component Value Date   WBC 2.0* 04/15/2012   HGB 9.7* 04/15/2012   HCT 29.6* 04/15/2012   MCV 109.2* 04/15/2012   PLT 837*  04/15/2012      Chemistry      Component Value Date/Time   NA 143 04/08/2012 1116   NA 140 09/19/2011 1154   K 4.0 04/08/2012 1116   K 5.0 09/19/2011 1154   CL 107 04/08/2012 1116   CL 107 09/19/2011 1154   CO2 28 04/08/2012 1116   CO2 28 09/19/2011 1154   BUN 23.8 04/08/2012 1116   BUN 26* 09/19/2011 1154   CREATININE 1.1 04/08/2012 1116   CREATININE 1.13 09/19/2011 1154      Component Value Date/Time   CALCIUM 8.8 04/08/2012 1116   CALCIUM 8.8 09/19/2011 1154   ALKPHOS 62 04/08/2012 1116   ALKPHOS 49 09/19/2011 1154   AST 19 04/08/2012 1116   AST 21 09/19/2011 1154   ALT 18 04/08/2012 1116   ALT 16  09/19/2011 1154   BILITOT 0.47 04/08/2012 1116   BILITOT 0.4 09/19/2011 1154       RADIOGRAPHIC STUDIES: No results found.  ASSESSMENT: This is a very pleasant 67 years old white male with essential thrombocythemia currently on hydroxyurea 1500 mg by mouth daily. The patient is doing fine his platelets count are still elevated but stable. Unfortunately I would not be able to increase his dose any further because of the decline in his total white blood count as well as absolute neutrophil count.   PLAN: I recommended for the patient to continue his current dose of hydroxyurea 1500 mg by mouth daily. I will continue to have weekly CBC and see him back for followup visit in one month. He was advised to call me immediately she has any concerning symptoms in the interval.  All questions were answered. The patient knows to call the clinic with any problems, questions or concerns. We can certainly see the patient much sooner if necessary.

## 2012-04-15 NOTE — Telephone Encounter (Signed)
Gave pt appt for labs and MD visit for MArch and APril 2014

## 2012-04-22 ENCOUNTER — Other Ambulatory Visit (HOSPITAL_BASED_OUTPATIENT_CLINIC_OR_DEPARTMENT_OTHER): Payer: Medicare Other

## 2012-04-22 ENCOUNTER — Other Ambulatory Visit: Payer: Self-pay | Admitting: Internal Medicine

## 2012-04-22 DIAGNOSIS — D473 Essential (hemorrhagic) thrombocythemia: Secondary | ICD-10-CM

## 2012-04-22 LAB — CBC WITH DIFFERENTIAL/PLATELET
BASO%: 0.5 % (ref 0.0–2.0)
HCT: 29.8 % — ABNORMAL LOW (ref 38.4–49.9)
MCHC: 31.9 g/dL — ABNORMAL LOW (ref 32.0–36.0)
MONO#: 0.2 10*3/uL (ref 0.1–0.9)
NEUT%: 46.8 % (ref 39.0–75.0)
RDW: 18.5 % — ABNORMAL HIGH (ref 11.0–14.6)
WBC: 2 10*3/uL — ABNORMAL LOW (ref 4.0–10.3)
lymph#: 0.9 10*3/uL (ref 0.9–3.3)

## 2012-04-22 LAB — COMPREHENSIVE METABOLIC PANEL (CC13)
AST: 18 U/L (ref 5–34)
Alkaline Phosphatase: 58 U/L (ref 40–150)
BUN: 26.3 mg/dL — ABNORMAL HIGH (ref 7.0–26.0)
CO2: 30 mEq/L — ABNORMAL HIGH (ref 22–29)
Glucose: 85 mg/dl (ref 70–99)
Total Bilirubin: 0.51 mg/dL (ref 0.20–1.20)

## 2012-04-29 ENCOUNTER — Other Ambulatory Visit (HOSPITAL_BASED_OUTPATIENT_CLINIC_OR_DEPARTMENT_OTHER): Payer: Medicare Other

## 2012-04-29 LAB — COMPREHENSIVE METABOLIC PANEL (CC13)
BUN: 30.6 mg/dL — ABNORMAL HIGH (ref 7.0–26.0)
CO2: 27 mEq/L (ref 22–29)
Calcium: 8.7 mg/dL (ref 8.4–10.4)
Chloride: 109 mEq/L — ABNORMAL HIGH (ref 98–107)
Creatinine: 1.1 mg/dL (ref 0.7–1.3)
Glucose: 94 mg/dl (ref 70–99)
Total Bilirubin: 0.4 mg/dL (ref 0.20–1.20)

## 2012-04-29 LAB — CBC WITH DIFFERENTIAL/PLATELET
Basophils Absolute: 0 10*3/uL (ref 0.0–0.1)
Eosinophils Absolute: 0 10*3/uL (ref 0.0–0.5)
HCT: 29.4 % — ABNORMAL LOW (ref 38.4–49.9)
HGB: 10.1 g/dL — ABNORMAL LOW (ref 13.0–17.1)
LYMPH%: 40.7 % (ref 14.0–49.0)
MCV: 110.4 fL — ABNORMAL HIGH (ref 79.3–98.0)
MONO%: 8.9 % (ref 0.0–14.0)
NEUT#: 1.2 10*3/uL — ABNORMAL LOW (ref 1.5–6.5)
NEUT%: 49 % (ref 39.0–75.0)
Platelets: 781 10*3/uL — ABNORMAL HIGH (ref 140–400)

## 2012-05-06 ENCOUNTER — Other Ambulatory Visit (HOSPITAL_BASED_OUTPATIENT_CLINIC_OR_DEPARTMENT_OTHER): Payer: Medicare Other

## 2012-05-06 DIAGNOSIS — D473 Essential (hemorrhagic) thrombocythemia: Secondary | ICD-10-CM

## 2012-05-06 LAB — COMPREHENSIVE METABOLIC PANEL (CC13)
ALT: 18 U/L (ref 0–55)
AST: 19 U/L (ref 5–34)
Albumin: 3.4 g/dL — ABNORMAL LOW (ref 3.5–5.0)
Alkaline Phosphatase: 60 U/L (ref 40–150)
BUN: 24.1 mg/dL (ref 7.0–26.0)
CO2: 29 meq/L (ref 22–29)
Calcium: 8.7 mg/dL (ref 8.4–10.4)
Chloride: 106 meq/L (ref 98–107)
Creatinine: 1 mg/dL (ref 0.7–1.3)
Glucose: 102 mg/dL — ABNORMAL HIGH (ref 70–99)
Potassium: 4.3 meq/L (ref 3.5–5.1)
Sodium: 141 meq/L (ref 136–145)
Total Bilirubin: 0.44 mg/dL (ref 0.20–1.20)
Total Protein: 6.3 g/dL — ABNORMAL LOW (ref 6.4–8.3)

## 2012-05-06 LAB — CBC WITH DIFFERENTIAL/PLATELET
BASO%: 0.4 % (ref 0.0–2.0)
Eosinophils Absolute: 0 10*3/uL (ref 0.0–0.5)
HCT: 28.7 % — ABNORMAL LOW (ref 38.4–49.9)
LYMPH%: 31.7 % (ref 14.0–49.0)
MONO#: 0.2 10*3/uL (ref 0.1–0.9)
NEUT#: 1.4 10*3/uL — ABNORMAL LOW (ref 1.5–6.5)
NEUT%: 59.2 % (ref 39.0–75.0)
Platelets: 727 10*3/uL — ABNORMAL HIGH (ref 140–400)
WBC: 2.4 10*3/uL — ABNORMAL LOW (ref 4.0–10.3)
lymph#: 0.8 10*3/uL — ABNORMAL LOW (ref 0.9–3.3)

## 2012-05-13 ENCOUNTER — Other Ambulatory Visit (HOSPITAL_BASED_OUTPATIENT_CLINIC_OR_DEPARTMENT_OTHER): Payer: Medicare Other

## 2012-05-13 DIAGNOSIS — D473 Essential (hemorrhagic) thrombocythemia: Secondary | ICD-10-CM

## 2012-05-13 LAB — CBC WITH DIFFERENTIAL/PLATELET
BASO%: 0.2 % (ref 0.0–2.0)
Eosinophils Absolute: 0 10*3/uL (ref 0.0–0.5)
MCHC: 33.7 g/dL (ref 32.0–36.0)
MONO#: 0.3 10*3/uL (ref 0.1–0.9)
NEUT#: 1.5 10*3/uL (ref 1.5–6.5)
Platelets: 795 10*3/uL — ABNORMAL HIGH (ref 140–400)
RBC: 2.65 10*6/uL — ABNORMAL LOW (ref 4.20–5.82)
WBC: 2.6 10*3/uL — ABNORMAL LOW (ref 4.0–10.3)
lymph#: 0.8 10*3/uL — ABNORMAL LOW (ref 0.9–3.3)

## 2012-05-19 ENCOUNTER — Ambulatory Visit: Payer: Medicare Other | Admitting: Internal Medicine

## 2012-05-20 ENCOUNTER — Encounter: Payer: Self-pay | Admitting: Internal Medicine

## 2012-05-20 ENCOUNTER — Other Ambulatory Visit (HOSPITAL_BASED_OUTPATIENT_CLINIC_OR_DEPARTMENT_OTHER): Payer: Medicare Other

## 2012-05-20 ENCOUNTER — Telehealth: Payer: Self-pay | Admitting: Internal Medicine

## 2012-05-20 ENCOUNTER — Ambulatory Visit (HOSPITAL_BASED_OUTPATIENT_CLINIC_OR_DEPARTMENT_OTHER): Payer: Medicare Other | Admitting: Internal Medicine

## 2012-05-20 VITALS — BP 134/66 | HR 73 | Temp 97.4°F | Resp 20 | Ht 69.0 in | Wt 150.2 lb

## 2012-05-20 DIAGNOSIS — D473 Essential (hemorrhagic) thrombocythemia: Secondary | ICD-10-CM

## 2012-05-20 LAB — CBC WITH DIFFERENTIAL/PLATELET
BASO%: 0.4 % (ref 0.0–2.0)
Basophils Absolute: 0 10*3/uL (ref 0.0–0.1)
Eosinophils Absolute: 0 10*3/uL (ref 0.0–0.5)
HCT: 30.2 % — ABNORMAL LOW (ref 38.4–49.9)
HGB: 9.7 g/dL — ABNORMAL LOW (ref 13.0–17.1)
LYMPH%: 41.2 % (ref 14.0–49.0)
MONO#: 0.3 10*3/uL (ref 0.1–0.9)
NEUT#: 1.1 10*3/uL — ABNORMAL LOW (ref 1.5–6.5)
NEUT%: 45.7 % (ref 39.0–75.0)
Platelets: 853 10*3/uL — ABNORMAL HIGH (ref 140–400)
WBC: 2.4 10*3/uL — ABNORMAL LOW (ref 4.0–10.3)
lymph#: 1 10*3/uL (ref 0.9–3.3)

## 2012-05-20 NOTE — Progress Notes (Signed)
[facility] Telephone:([phone]) [phone]   Fax:([phone]  OFFICE PROGRESS NOTE  [name], MD [address] [place] [address]  DIAGNOSIS: Essential thrombocythemia.   PRIOR THERAPY:  1) Hydroxyurea 1000 mg alternating with 1500 mg by mouth every other day.  2) anagrelide 2.5 mg by mouth daily discontinued recently by Dr. Malen Gauze at [facility]   CURRENT THERAPY:  1) Hydroxyurea 1500 mg by mouth every day.  2) Integra plus 1 capsule by mouth daily for iron deficiency.  3) aspirin 81 mg by mouth daily.    INTERVAL HISTORY: Thomas Cherry 67 y.o. male returns to the clinic today for routine follow up visit. The patient has no complaints today. He is tolerating his treatment with hydroxyurea fairly well with no significant adverse effects. He denied having any significant nausea or vomiting, no hair loss. He denied having any chest pain, shortness of breath, cough or hemoptysis. He has no bleeding issues. The patient had repeat CBC performed earlier today and he is here for evaluation and discussion of his lab results. His platelets count has been ranging from 750,000-900,000 over the last few weeks.  MEDICAL HISTORY: Past Medical History  Diagnosis Date  . GERD (gastroesophageal reflux disease)   . Allergy     heyfever  . Hypertension   . Palpitation   . Elevated platelet count     on hydroxyurea  . Heart murmur     ALLERGIES:  has No Known Allergies.  MEDICATIONS:  Current Outpatient Prescriptions  Medication Sig Dispense Refill  . aspirin 81 MG tablet Take 81 mg by mouth daily.        Marland Kitchen diltiazem (CARDIZEM CD) 180 MG 24 hr capsule TAKE ONE CAPSULE BY MOUTH DAILY  30 capsule  5  . esomeprazole (NEXIUM) 40 MG capsule Take 40 mg by mouth every other day.        Marland Kitchen FeFum-FePoly-FA-B Cmp-C-Biot (INTEGRA PLUS) CAPS TAKE ONE CAPSULE BY MOUTH DAILY.  30 each  1  . fluocinonide (LIDEX) 0.05 % external solution Apply topically Twice  daily as needed.      . folic acid (FOLVITE) 1 MG tablet Take 1 mg by mouth daily.      . hydroxyurea (HYDREA) 500 MG capsule TAKE THREE CAPSULES BY MOUTH EVERY DAY. MAY TAKE WITH FOOD TO MINIMIZE GI SIDE EFFECTS  90 capsule  0  . losartan-hydrochlorothiazide (HYZAAR) 50-12.5 MG per tablet 1/2 tab daily  15 tablet  11   No current facility-administered medications for this visit.    SURGICAL HISTORY:  Past Surgical History  Procedure Laterality Date  . Hernia repair      REVIEW OF SYSTEMS:  A comprehensive review of systems was negative.   PHYSICAL EXAMINATION: General appearance: alert, cooperative, fatigued and no distress Head: Normocephalic, without obvious abnormality, atraumatic Neck: no adenopathy Lymph nodes: Cervical, supraclavicular, and axillary nodes normal. Resp: clear to auscultation bilaterally Cardio: regular rate and rhythm, S1, S2 normal, no murmur, click, rub or gallop GI: soft, non-tender; bowel sounds normal; no masses,  no organomegaly Extremities: extremities normal, atraumatic, no cyanosis or edema  ECOG PERFORMANCE STATUS: 0 - Asymptomatic  Blood pressure 134/66, pulse 73, temperature 97.4 F (36.3 C), temperature source Oral, resp. rate 20, height 5\' 9"  (1.753 m), weight 150 lb 3.2 oz (68.13 kg).  LABORATORY DATA: Lab Results  Component Value Date   WBC 2.4* 05/20/2012   HGB 9.7* 05/20/2012   HCT 30.2*  05/20/2012   MCV 109.0* 05/20/2012   PLT 853* 05/20/2012      Chemistry      Component Value Date/Time   NA 141 05/06/2012 1208   NA 145 04/15/2012 1142   K 4.3 05/06/2012 1208   K 4.8 04/15/2012 1142   CL 106 05/06/2012 1208   CL 110 04/15/2012 1142   CO2 29 05/06/2012 1208   CO2 27 04/15/2012 1142   BUN 24.1 05/06/2012 1208   BUN 27* 04/15/2012 1142   CREATININE 1.0 05/06/2012 1208   CREATININE 1.03 04/15/2012 1142      Component Value Date/Time   CALCIUM 8.7 05/06/2012 1208   CALCIUM 8.6 04/15/2012 1142   ALKPHOS 60 05/06/2012 1208   ALKPHOS 57 04/15/2012 1142    AST 19 05/06/2012 1208   AST 28 04/15/2012 1142   ALT 18 05/06/2012 1208   ALT <5 04/15/2012 1142   BILITOT 0.44 05/06/2012 1208   BILITOT 0.3 04/15/2012 1142       RADIOGRAPHIC STUDIES: No results found.  ASSESSMENT: this is a very pleasant 67 years old white male with history of essential thrombocythemia currently on hydroxyurea 1500 mg by mouth daily. His platelets count is still high. The patient also has mild leukocytopenia and neutropenia as well as anemia.  PLAN: I discussed the lab result with the patient today. I recommended for him to increase the dose of hydroxyurea to 1500 mg alternating with 2000 mg every other day. I will continue to monitor his CBC every 2 weeks. The patient is scheduled to see Dr. Malen Gauze the end of the month for reevaluation. I would see him back for follow up visit in 2 months or sooner if needed He was advised to call immediately if he has any concerning symptoms in the interval.  All questions were answered. The patient knows to call the clinic with any problems, questions or concerns. We can certainly see the patient much sooner if necessary.

## 2012-05-20 NOTE — Patient Instructions (Signed)
We will change her dose of hydroxyurea to 1500 mg alternating by 2000 mg every other day. CBC every 2 weeks Followup visit in 2 months

## 2012-05-22 ENCOUNTER — Other Ambulatory Visit: Payer: Self-pay | Admitting: Internal Medicine

## 2012-05-30 ENCOUNTER — Other Ambulatory Visit: Payer: Self-pay | Admitting: Internal Medicine

## 2012-06-03 ENCOUNTER — Other Ambulatory Visit: Payer: Self-pay | Admitting: Endocrinology

## 2012-06-03 ENCOUNTER — Other Ambulatory Visit (HOSPITAL_BASED_OUTPATIENT_CLINIC_OR_DEPARTMENT_OTHER): Payer: Medicare Other

## 2012-06-03 DIAGNOSIS — D473 Essential (hemorrhagic) thrombocythemia: Secondary | ICD-10-CM

## 2012-06-03 LAB — CBC WITH DIFFERENTIAL/PLATELET
Basophils Absolute: 0 10*3/uL (ref 0.0–0.1)
EOS%: 0.7 % (ref 0.0–7.0)
Eosinophils Absolute: 0 10*3/uL (ref 0.0–0.5)
HGB: 9.6 g/dL — ABNORMAL LOW (ref 13.0–17.1)
MCV: 112.3 fL — ABNORMAL HIGH (ref 79.3–98.0)
MONO%: 8.8 % (ref 0.0–14.0)
NEUT#: 0.9 10*3/uL — ABNORMAL LOW (ref 1.5–6.5)
RBC: 2.55 10*6/uL — ABNORMAL LOW (ref 4.20–5.82)
RDW: 16.1 % — ABNORMAL HIGH (ref 11.0–14.6)
lymph#: 0.7 10*3/uL — ABNORMAL LOW (ref 0.9–3.3)

## 2012-06-03 LAB — COMPREHENSIVE METABOLIC PANEL (CC13)
ALT: 15 U/L (ref 0–55)
Albumin: 3.4 g/dL — ABNORMAL LOW (ref 3.5–5.0)
Alkaline Phosphatase: 57 U/L (ref 40–150)
CO2: 27 mEq/L (ref 22–29)
Glucose: 95 mg/dl (ref 70–99)
Potassium: 4.1 mEq/L (ref 3.5–5.1)
Sodium: 141 mEq/L (ref 136–145)
Total Bilirubin: 0.42 mg/dL (ref 0.20–1.20)
Total Protein: 6.2 g/dL — ABNORMAL LOW (ref 6.4–8.3)

## 2012-06-03 LAB — LACTATE DEHYDROGENASE (CC13): LDH: 189 U/L (ref 125–245)

## 2012-06-16 ENCOUNTER — Telehealth: Payer: Self-pay | Admitting: Internal Medicine

## 2012-06-16 NOTE — Telephone Encounter (Signed)
pt called to r/s lab....Done °

## 2012-06-17 ENCOUNTER — Other Ambulatory Visit: Payer: Medicare Other

## 2012-06-19 ENCOUNTER — Other Ambulatory Visit: Payer: Self-pay | Admitting: Internal Medicine

## 2012-06-19 ENCOUNTER — Other Ambulatory Visit (HOSPITAL_BASED_OUTPATIENT_CLINIC_OR_DEPARTMENT_OTHER): Payer: Medicare Other

## 2012-06-19 DIAGNOSIS — D473 Essential (hemorrhagic) thrombocythemia: Secondary | ICD-10-CM

## 2012-06-19 LAB — CBC WITH DIFFERENTIAL/PLATELET
BASO%: 0.6 % (ref 0.0–2.0)
EOS%: 0.9 % (ref 0.0–7.0)
LYMPH%: 35.7 % (ref 14.0–49.0)
MCH: 38 pg — ABNORMAL HIGH (ref 27.2–33.4)
MCHC: 34.1 g/dL (ref 32.0–36.0)
MCV: 111.5 fL — ABNORMAL HIGH (ref 79.3–98.0)
MONO%: 9.3 % (ref 0.0–14.0)
Platelets: 571 10*3/uL — ABNORMAL HIGH (ref 140–400)
RBC: 2.46 10*6/uL — ABNORMAL LOW (ref 4.20–5.82)
RDW: 16.3 % — ABNORMAL HIGH (ref 11.0–14.6)

## 2012-06-19 LAB — COMPREHENSIVE METABOLIC PANEL (CC13)
ALT: 14 U/L (ref 0–55)
AST: 19 U/L (ref 5–34)
Alkaline Phosphatase: 59 U/L (ref 40–150)
Sodium: 142 mEq/L (ref 136–145)
Total Bilirubin: 0.48 mg/dL (ref 0.20–1.20)
Total Protein: 6.4 g/dL (ref 6.4–8.3)

## 2012-07-01 ENCOUNTER — Other Ambulatory Visit (HOSPITAL_BASED_OUTPATIENT_CLINIC_OR_DEPARTMENT_OTHER): Payer: Medicare Other

## 2012-07-01 DIAGNOSIS — D473 Essential (hemorrhagic) thrombocythemia: Secondary | ICD-10-CM

## 2012-07-01 LAB — CBC WITH DIFFERENTIAL/PLATELET
BASO%: 0.7 % (ref 0.0–2.0)
Basophils Absolute: 0 10*3/uL (ref 0.0–0.1)
EOS%: 1.1 % (ref 0.0–7.0)
HCT: 28.8 % — ABNORMAL LOW (ref 38.4–49.9)
LYMPH%: 26.3 % (ref 14.0–49.0)
MCH: 37 pg — ABNORMAL HIGH (ref 27.2–33.4)
MCHC: 33.2 g/dL (ref 32.0–36.0)
NEUT%: 55.9 % (ref 39.0–75.0)
Platelets: 1033 10*3/uL — ABNORMAL HIGH (ref 140–400)

## 2012-07-01 LAB — COMPREHENSIVE METABOLIC PANEL (CC13)
AST: 19 U/L (ref 5–34)
Alkaline Phosphatase: 59 U/L (ref 40–150)
BUN: 23.4 mg/dL (ref 7.0–26.0)
Calcium: 8.8 mg/dL (ref 8.4–10.4)
Chloride: 106 mEq/L (ref 98–107)
Creatinine: 1.2 mg/dL (ref 0.7–1.3)
Glucose: 102 mg/dl — ABNORMAL HIGH (ref 70–99)

## 2012-07-02 ENCOUNTER — Telehealth: Payer: Self-pay | Admitting: *Deleted

## 2012-07-02 DIAGNOSIS — D473 Essential (hemorrhagic) thrombocythemia: Secondary | ICD-10-CM

## 2012-07-02 NOTE — Telephone Encounter (Signed)
Pt called to let Dr Donnald Garre know that Dr Malen Gauze has increased his anagrelide to 2mg  in the morning and 2mg  in the evening.  Pt also requested to see if Dr Donnald Garre wants to check lab work next week and not wait until 6/3.  Per Dr Donnald Garre, okay to scheduled next week.  Appt scheduled for 5/28.  SLJ

## 2012-07-03 ENCOUNTER — Other Ambulatory Visit: Payer: Self-pay | Admitting: Endocrinology

## 2012-07-09 ENCOUNTER — Telehealth: Payer: Self-pay | Admitting: Medical Oncology

## 2012-07-09 ENCOUNTER — Other Ambulatory Visit (HOSPITAL_BASED_OUTPATIENT_CLINIC_OR_DEPARTMENT_OTHER): Payer: Medicare Other

## 2012-07-09 DIAGNOSIS — D473 Essential (hemorrhagic) thrombocythemia: Secondary | ICD-10-CM

## 2012-07-09 LAB — CBC WITH DIFFERENTIAL/PLATELET
Basophils Absolute: 0 10*3/uL (ref 0.0–0.1)
Eosinophils Absolute: 0 10*3/uL (ref 0.0–0.5)
HCT: 28.4 % — ABNORMAL LOW (ref 38.4–49.9)
HGB: 9.2 g/dL — ABNORMAL LOW (ref 13.0–17.1)
LYMPH%: 21.7 % (ref 14.0–49.0)
MCHC: 32.5 g/dL (ref 32.0–36.0)
MONO#: 0.8 10*3/uL (ref 0.1–0.9)
NEUT#: 4.3 10*3/uL (ref 1.5–6.5)
NEUT%: 65.8 % (ref 39.0–75.0)
Platelets: 1513 10*3/uL — ABNORMAL HIGH (ref 140–400)
WBC: 6.6 10*3/uL (ref 4.0–10.3)

## 2012-07-09 LAB — COMPREHENSIVE METABOLIC PANEL (CC13)
Albumin: 3.2 g/dL — ABNORMAL LOW (ref 3.5–5.0)
BUN: 29.5 mg/dL — ABNORMAL HIGH (ref 7.0–26.0)
CO2: 27 mEq/L (ref 22–29)
Calcium: 8.8 mg/dL (ref 8.4–10.4)
Chloride: 110 mEq/L — ABNORMAL HIGH (ref 98–107)
Creatinine: 1.2 mg/dL (ref 0.7–1.3)
Potassium: 4.1 mEq/L (ref 3.5–5.1)

## 2012-07-09 NOTE — Telephone Encounter (Signed)
Pt aware of platelet count and has mailed Dr Malen Gauze at [facility] . I asked him to please call us back with Dr Roni Bread recommendations.

## 2012-07-11 ENCOUNTER — Telehealth: Payer: Self-pay | Admitting: Medical Oncology

## 2012-07-11 ENCOUNTER — Encounter: Payer: Self-pay | Admitting: Medical Oncology

## 2012-07-11 NOTE — Telephone Encounter (Signed)
Pt stated Dr Malen Gauze at [facility] wants pt to increase his anagrelide to 4.5 mg BID now and if he does not have any side effects in 1 week to increase it to 5 mg BID. I will route to Dr Arbutus Ped.

## 2012-07-11 NOTE — Telephone Encounter (Signed)
Med list updated

## 2012-07-15 ENCOUNTER — Other Ambulatory Visit (HOSPITAL_BASED_OUTPATIENT_CLINIC_OR_DEPARTMENT_OTHER): Payer: Medicare Other

## 2012-07-15 DIAGNOSIS — D473 Essential (hemorrhagic) thrombocythemia: Secondary | ICD-10-CM

## 2012-07-15 LAB — CBC WITH DIFFERENTIAL/PLATELET
Basophils Absolute: 0.1 10*3/uL (ref 0.0–0.1)
Eosinophils Absolute: 0.1 10*3/uL (ref 0.0–0.5)
HGB: 9.1 g/dL — ABNORMAL LOW (ref 13.0–17.1)
NEUT#: 5.6 10*3/uL (ref 1.5–6.5)
RDW: 15.9 % — ABNORMAL HIGH (ref 11.0–14.6)
lymph#: 1.3 10*3/uL (ref 0.9–3.3)

## 2012-07-15 LAB — COMPREHENSIVE METABOLIC PANEL (CC13)
Albumin: 3.2 g/dL — ABNORMAL LOW (ref 3.5–5.0)
BUN: 30.4 mg/dL — ABNORMAL HIGH (ref 7.0–26.0)
CO2: 25 mEq/L (ref 22–29)
Calcium: 8.6 mg/dL (ref 8.4–10.4)
Chloride: 111 mEq/L — ABNORMAL HIGH (ref 98–107)
Glucose: 103 mg/dl — ABNORMAL HIGH (ref 70–99)
Potassium: 4.3 mEq/L (ref 3.5–5.1)

## 2012-07-29 ENCOUNTER — Other Ambulatory Visit (HOSPITAL_BASED_OUTPATIENT_CLINIC_OR_DEPARTMENT_OTHER): Payer: Medicare Other | Admitting: Lab

## 2012-07-29 ENCOUNTER — Telehealth: Payer: Self-pay | Admitting: Internal Medicine

## 2012-07-29 ENCOUNTER — Ambulatory Visit (HOSPITAL_BASED_OUTPATIENT_CLINIC_OR_DEPARTMENT_OTHER): Payer: Medicare Other | Admitting: Internal Medicine

## 2012-07-29 ENCOUNTER — Encounter: Payer: Self-pay | Admitting: Internal Medicine

## 2012-07-29 VITALS — BP 136/60 | HR 103 | Temp 98.8°F | Resp 18 | Ht 69.0 in | Wt 154.7 lb

## 2012-07-29 DIAGNOSIS — D473 Essential (hemorrhagic) thrombocythemia: Secondary | ICD-10-CM

## 2012-07-29 LAB — COMPREHENSIVE METABOLIC PANEL (CC13)
BUN: 30.4 mg/dL — ABNORMAL HIGH (ref 7.0–26.0)
CO2: 23 mEq/L (ref 22–29)
Calcium: 8.7 mg/dL (ref 8.4–10.4)
Chloride: 110 mEq/L — ABNORMAL HIGH (ref 98–107)
Creatinine: 1.2 mg/dL (ref 0.7–1.3)

## 2012-07-29 LAB — CBC WITH DIFFERENTIAL/PLATELET
Basophils Absolute: 0.1 10*3/uL (ref 0.0–0.1)
EOS%: 6.7 % (ref 0.0–7.0)
Eosinophils Absolute: 0.5 10*3/uL (ref 0.0–0.5)
HGB: 8.6 g/dL — ABNORMAL LOW (ref 13.0–17.1)
MCH: 35.2 pg — ABNORMAL HIGH (ref 27.2–33.4)
NEUT#: 4.5 10*3/uL (ref 1.5–6.5)
RDW: 18.1 % — ABNORMAL HIGH (ref 11.0–14.6)
lymph#: 1.2 10*3/uL (ref 0.9–3.3)

## 2012-07-29 NOTE — Telephone Encounter (Signed)
Gve pt appt calenda rappt for labs and MD July and  August 2014

## 2012-07-29 NOTE — Patient Instructions (Signed)
Continue anagrelide with the same dose for now. CBC every 2 weeks. Followup visit in 2 months.

## 2012-07-29 NOTE — Progress Notes (Signed)
[facility] Telephone:([phone]) [phone]   Fax:(336) [phone]  OFFICE PROGRESS NOTE  [name], MD [address] [place] [address]  DIAGNOSIS: Essential thrombocythemia.   PRIOR THERAPY:  1) Hydroxyurea 1000 mg alternating with 1500 mg by mouth every other day.  2) anagrelide 2.5 mg by mouth daily discontinued recently by Dr. Malen Gauze at [facility].   CURRENT THERAPY:  1) anagrelide 4.5 mg by mouth twice a day 2) Integra plus 1 capsule by mouth daily for iron deficiency.  3) aspirin 81 mg by mouth daily.    INTERVAL HISTORY: Thomas Cherry 67 y.o. male returns to the clinic today for followup visit. The patient is feeling fine today with no specific complaints except for increasing fatigue. He started noticing some chest pounding as well as head throbbing recently after his dose of anagrelide was increased to 4.5 mg by mouth twice a day. He also has some swelling of the lower extremities. The patient takes Tylenol as well as occasional ibuprofen for his pain medication. He denied having any significant chest pain or shortness of  breath except with exertion. He denied having any significant weight loss or night sweats. He is scheduled to see Dr. Malen Gauze at [facility] next week. He has repeat CBC performed earlier today and he is here for evaluation and discussion of his lab results.  MEDICAL HISTORY: Past Medical History  Diagnosis Date  . GERD (gastroesophageal reflux disease)   . Allergy     heyfever  . Hypertension   . Palpitation   . Elevated platelet count     on hydroxyurea  . Heart murmur     ALLERGIES:  has No Known Allergies.  MEDICATIONS:  Current Outpatient Prescriptions  Medication Sig Dispense Refill  . anagrelide (AGRYLIN) 0.5 MG capsule Take 4.5 mg by mouth 2 (two) times daily. Dr Malen [facility]ze at [facility]      . aspirin 81 MG tablet Take 81 mg by mouth daily.        Marland Kitchen diltiazem (CARDIZEM CD) 180 MG 24 hr capsule TAKE ONE  CAPSULE BY MOUTH ONCE DAILY  30 capsule  0  . esomeprazole (NEXIUM) 40 MG capsule Take 40 mg by mouth every other day.        Marland Kitchen FeFum-FePoly-FA-B Cmp-C-Biot (INTEGRA PLUS) CAPS TAKE ONE CAPSULE BY MOUTH EVERY DAY  30 capsule  3  . folic acid (FOLVITE) 1 MG tablet Take 1 mg by mouth daily.      Marland Kitchen losartan-hydrochlorothiazide (HYZAAR) 50-12.5 MG per tablet 1/2 tab daily  15 tablet  11   No current facility-administered medications for this visit.    SURGICAL HISTORY:  Past Surgical History  Procedure Laterality Date  . Hernia repair      REVIEW OF SYSTEMS:  A comprehensive review of systems was negative except for: Constitutional: positive for fatigue Respiratory: positive for dyspnea on exertion Neurological: positive for headaches Swelling of the lower extremities   PHYSICAL EXAMINATION: General appearance: alert, cooperative, fatigued and no distress Head: Normocephalic, without obvious abnormality, atraumatic Neck: no adenopathy Lymph nodes: Cervical, supraclavicular, and axillary nodes normal. Resp: clear to auscultation bilaterally Cardio: Tachycardic GI: soft, non-tender; bowel sounds normal; no masses,  no organomegaly Extremities: extremities normal, atraumatic, no cyanosis or edema  ECOG PERFORMANCE STATUS: 1 - Symptomatic but completely ambulatory  Blood pressure 136/60, pulse 103, temperature 98.8 F (37.1 C), temperature source Oral, resp. rate 18, height 5\' 9"  (1.753 m), weight  154 lb 11.2 oz (70.171 kg).  LABORATORY DATA: Lab Results  Component Value Date   WBC 7.0 07/29/2012   HGB 8.6* 07/29/2012   HCT 25.2* 07/29/2012   MCV 103.1* 07/29/2012   PLT 530* 07/29/2012      Chemistry      Component Value Date/Time   NA 142 07/29/2012 1132   NA 145 04/15/2012 1142   K 4.2 07/29/2012 1132   K 4.8 04/15/2012 1142   CL 110* 07/29/2012 1132   CL 110 04/15/2012 1142   CO2 23 07/29/2012 1132   CO2 27 04/15/2012 1142   BUN 30.4* 07/29/2012 1132   BUN 27* 04/15/2012 1142    CREATININE 1.2 07/29/2012 1132   CREATININE 1.03 04/15/2012 1142      Component Value Date/Time   CALCIUM 8.7 07/29/2012 1132   CALCIUM 8.6 04/15/2012 1142   ALKPHOS 76 07/29/2012 1132   ALKPHOS 57 04/15/2012 1142   AST 21 07/29/2012 1132   AST 28 04/15/2012 1142   ALT 12 07/29/2012 1132   ALT <5 04/15/2012 1142   BILITOT 0.32 07/29/2012 1132   BILITOT 0.3 04/15/2012 1142       RADIOGRAPHIC STUDIES: No results found.  ASSESSMENT AND PLAN: This is a very pleasant 67 years old white male with history of essential thrombocythemia currently on treatment with anagrelide 4.5 mg by mouth twice a day. There is improvement in his platelets count down to 530,000, but the patient has elected side effects from his treatment with anagrelide with head throbbing and chest pounding that is most likely secondary to anemia. I recommended for the patient to continue on his current dose of anagrelide for now until he sees Dr. Malen Gauze. His anagrelide dose may need to be reduced if his platelets count continues to be in the normal range. I will continue to monitor his CBC every 2 weeks. I would see him back for followup visit in 2 months. He was advised to call immediately if he has any concerning symptoms in the interval All questions were answered. The patient knows to call the clinic with any problems, questions or concerns. We can certainly see the patient much sooner if necessary.

## 2012-08-04 ENCOUNTER — Other Ambulatory Visit: Payer: Self-pay | Admitting: Endocrinology

## 2012-08-05 ENCOUNTER — Other Ambulatory Visit: Payer: Self-pay | Admitting: *Deleted

## 2012-08-05 MED ORDER — DILTIAZEM HCL ER COATED BEADS 180 MG PO CP24: 180.0000 mg | ORAL_CAPSULE | Freq: Every day | ORAL | Status: DC

## 2012-08-12 ENCOUNTER — Other Ambulatory Visit (HOSPITAL_BASED_OUTPATIENT_CLINIC_OR_DEPARTMENT_OTHER): Payer: Medicare Other

## 2012-08-12 DIAGNOSIS — D473 Essential (hemorrhagic) thrombocythemia: Secondary | ICD-10-CM

## 2012-08-12 LAB — CBC WITH DIFFERENTIAL/PLATELET
Basophils Absolute: 0 10*3/uL (ref 0.0–0.1)
HCT: 25.4 % — ABNORMAL LOW (ref 38.4–49.9)
HGB: 8.5 g/dL — ABNORMAL LOW (ref 13.0–17.1)
MONO#: 0.4 10*3/uL (ref 0.1–0.9)
NEUT#: 3.1 10*3/uL (ref 1.5–6.5)
NEUT%: 64.8 % (ref 39.0–75.0)
WBC: 4.8 10*3/uL (ref 4.0–10.3)
lymph#: 1.2 10*3/uL (ref 0.9–3.3)

## 2012-08-21 ENCOUNTER — Telehealth: Payer: Self-pay | Admitting: Internal Medicine

## 2012-08-21 NOTE — Telephone Encounter (Signed)
pt called to r/s appt....Done °

## 2012-08-25 ENCOUNTER — Other Ambulatory Visit: Payer: Self-pay | Admitting: *Deleted

## 2012-08-25 ENCOUNTER — Other Ambulatory Visit (HOSPITAL_BASED_OUTPATIENT_CLINIC_OR_DEPARTMENT_OTHER): Payer: Medicare Other

## 2012-08-25 DIAGNOSIS — D473 Essential (hemorrhagic) thrombocythemia: Secondary | ICD-10-CM

## 2012-08-25 LAB — CBC WITH DIFFERENTIAL/PLATELET
Basophils Absolute: 0 10*3/uL (ref 0.0–0.1)
EOS%: 1 % (ref 0.0–7.0)
Eosinophils Absolute: 0 10*3/uL (ref 0.0–0.5)
HCT: 25.1 % — ABNORMAL LOW (ref 38.4–49.9)
HGB: 8 g/dL — ABNORMAL LOW (ref 13.0–17.1)
MCH: 30.8 pg (ref 27.2–33.4)
MCV: 96.5 fL (ref 79.3–98.0)
NEUT%: 59.5 % (ref 39.0–75.0)
lymph#: 0.9 10*3/uL (ref 0.9–3.3)

## 2012-08-25 MED ORDER — ANAGRELIDE HCL 0.5 MG PO CAPS: 2.5000 mg | ORAL_CAPSULE | Freq: Two times a day (BID) | ORAL | Status: DC

## 2012-08-26 ENCOUNTER — Other Ambulatory Visit: Payer: Medicare Other

## 2012-09-09 ENCOUNTER — Other Ambulatory Visit (HOSPITAL_BASED_OUTPATIENT_CLINIC_OR_DEPARTMENT_OTHER): Payer: Medicare Other

## 2012-09-09 DIAGNOSIS — D473 Essential (hemorrhagic) thrombocythemia: Secondary | ICD-10-CM

## 2012-09-09 LAB — CBC WITH DIFFERENTIAL/PLATELET
Basophils Absolute: 0 10*3/uL (ref 0.0–0.1)
Eosinophils Absolute: 0 10*3/uL (ref 0.0–0.5)
HCT: 26 % — ABNORMAL LOW (ref 38.4–49.9)
LYMPH%: 32.3 % (ref 14.0–49.0)
MCV: 99.4 fL — ABNORMAL HIGH (ref 79.3–98.0)
MONO%: 10.4 % (ref 0.0–14.0)
NEUT#: 2.1 10*3/uL (ref 1.5–6.5)
NEUT%: 56.2 % (ref 39.0–75.0)
Platelets: 701 10*3/uL — ABNORMAL HIGH (ref 140–400)
RBC: 2.62 10*6/uL — ABNORMAL LOW (ref 4.20–5.82)

## 2012-09-18 ENCOUNTER — Other Ambulatory Visit: Payer: Self-pay | Admitting: Medical Oncology

## 2012-09-18 ENCOUNTER — Other Ambulatory Visit: Payer: Self-pay | Admitting: Internal Medicine

## 2012-09-18 DIAGNOSIS — D473 Essential (hemorrhagic) thrombocythemia: Secondary | ICD-10-CM

## 2012-09-18 MED ORDER — HYDROXYUREA 500 MG PO CAPS: 500.0000 mg | ORAL_CAPSULE | Freq: Every day | ORAL | Status: DC

## 2012-09-18 NOTE — Telephone Encounter (Signed)
Pt called requesting refill for hydrea

## 2012-09-18 NOTE — Telephone Encounter (Signed)
Message copied by Charma Igo on Thu Sep 18, 2012  1:04 PM ------      Message from: Si Gaul      Created: Thu Sep 18, 2012 10:57 AM       Yes Dr. Malen Gauze at [facility] recommended to restart him on Hydrea again.      ----- Message -----         From: Charma Igo, RN         Sent: 09/18/2012   9:57 AM           To: Si Gaul, MD            Thomas Cherry is requesting a refill on hydrea. You refilled it on 07/10/12 #90 to take 1500 mg daily.             Your last note in June indicated he is not on hydrea .            Do you want me to refill his hydrea and if so what dose?       ------

## 2012-09-19 ENCOUNTER — Other Ambulatory Visit: Payer: Self-pay | Admitting: Gastroenterology

## 2012-09-19 ENCOUNTER — Other Ambulatory Visit: Payer: Self-pay | Admitting: *Deleted

## 2012-09-19 DIAGNOSIS — D473 Essential (hemorrhagic) thrombocythemia: Secondary | ICD-10-CM

## 2012-09-19 MED ORDER — HYDROXYUREA 500 MG PO CAPS: 1000.0000 mg | ORAL_CAPSULE | Freq: Every day | ORAL | Status: DC

## 2012-09-20 ENCOUNTER — Other Ambulatory Visit: Payer: Self-pay | Admitting: Gastroenterology

## 2012-09-22 ENCOUNTER — Telehealth: Payer: Self-pay | Admitting: Medical Oncology

## 2012-09-22 NOTE — Telephone Encounter (Signed)
[facility] scheduled for Dr Malen Gauze -pt notified of appt.

## 2012-09-23 ENCOUNTER — Other Ambulatory Visit (HOSPITAL_BASED_OUTPATIENT_CLINIC_OR_DEPARTMENT_OTHER): Payer: Medicare Other

## 2012-09-23 DIAGNOSIS — D473 Essential (hemorrhagic) thrombocythemia: Secondary | ICD-10-CM

## 2012-09-23 LAB — CBC WITH DIFFERENTIAL/PLATELET
BASO%: 0.2 % (ref 0.0–2.0)
EOS%: 0.7 % (ref 0.0–7.0)
HCT: 24.5 % — ABNORMAL LOW (ref 38.4–49.9)
LYMPH%: 29.8 % (ref 14.0–49.0)
MCH: 32.9 pg (ref 27.2–33.4)
MCHC: 33.2 g/dL (ref 32.0–36.0)
NEUT%: 62.2 % (ref 39.0–75.0)
Platelets: 572 10*3/uL — ABNORMAL HIGH (ref 140–400)
RBC: 2.47 10*6/uL — ABNORMAL LOW (ref 4.20–5.82)

## 2012-09-30 ENCOUNTER — Other Ambulatory Visit (HOSPITAL_BASED_OUTPATIENT_CLINIC_OR_DEPARTMENT_OTHER): Payer: Medicare Other | Admitting: Lab

## 2012-09-30 ENCOUNTER — Ambulatory Visit: Payer: Medicare Other | Admitting: Internal Medicine

## 2012-09-30 DIAGNOSIS — D473 Essential (hemorrhagic) thrombocythemia: Secondary | ICD-10-CM

## 2012-09-30 LAB — CBC WITH DIFFERENTIAL/PLATELET
BASO%: 0.3 % (ref 0.0–2.0)
EOS%: 0.7 % (ref 0.0–7.0)
MCH: 32.9 pg (ref 27.2–33.4)
MCHC: 33.6 g/dL (ref 32.0–36.0)
RBC: 2.62 10*6/uL — ABNORMAL LOW (ref 4.20–5.82)
RDW: 26.2 % — ABNORMAL HIGH (ref 11.0–14.6)
lymph#: 1.6 10*3/uL (ref 0.9–3.3)

## 2012-10-02 ENCOUNTER — Other Ambulatory Visit: Payer: Self-pay | Admitting: Internal Medicine

## 2012-10-02 DIAGNOSIS — D473 Essential (hemorrhagic) thrombocythemia: Secondary | ICD-10-CM

## 2012-10-06 ENCOUNTER — Other Ambulatory Visit: Payer: Self-pay | Admitting: *Deleted

## 2012-10-06 DIAGNOSIS — D473 Essential (hemorrhagic) thrombocythemia: Secondary | ICD-10-CM

## 2012-10-06 MED ORDER — ANAGRELIDE HCL 0.5 MG PO CAPS: 0.5000 mg | ORAL_CAPSULE | ORAL | Status: DC

## 2012-10-07 ENCOUNTER — Other Ambulatory Visit: Payer: Medicare Other

## 2012-10-07 ENCOUNTER — Ambulatory Visit: Payer: Medicare Other | Admitting: Internal Medicine

## 2012-10-10 ENCOUNTER — Other Ambulatory Visit (HOSPITAL_BASED_OUTPATIENT_CLINIC_OR_DEPARTMENT_OTHER): Payer: Medicare Other | Admitting: Lab

## 2012-10-10 ENCOUNTER — Ambulatory Visit: Payer: Medicare Other | Admitting: Internal Medicine

## 2012-10-10 DIAGNOSIS — D473 Essential (hemorrhagic) thrombocythemia: Secondary | ICD-10-CM

## 2012-10-10 LAB — CBC WITH DIFFERENTIAL/PLATELET
Basophils Absolute: 0 10*3/uL (ref 0.0–0.1)
EOS%: 0.9 % (ref 0.0–7.0)
HGB: 8.3 g/dL — ABNORMAL LOW (ref 13.0–17.1)
MCH: 32.8 pg (ref 27.2–33.4)
NEUT#: 2.4 10*3/uL (ref 1.5–6.5)
RDW: 28.4 % — ABNORMAL HIGH (ref 11.0–14.6)
WBC: 4.1 10*3/uL (ref 4.0–10.3)
lymph#: 1.3 10*3/uL (ref 0.9–3.3)

## 2012-10-14 ENCOUNTER — Other Ambulatory Visit: Payer: Self-pay | Admitting: Endocrinology

## 2012-10-14 ENCOUNTER — Other Ambulatory Visit: Payer: Self-pay | Admitting: Internal Medicine

## 2012-10-14 DIAGNOSIS — D693 Immune thrombocytopenic purpura: Secondary | ICD-10-CM

## 2012-11-06 ENCOUNTER — Telehealth: Payer: Self-pay | Admitting: *Deleted

## 2012-11-06 NOTE — Telephone Encounter (Signed)
Pt called wanting to know when he needs to schedule a f/u with Dr Donnald Garre.  He was scheduled to see Dr Donnald Garre 10/10/12 but Dr Donnald Garre was not in the office so the appt was cancelled.  He did have lab work that day.  Pt states he recently saw Dr Malen Gauze and his hydrea was increased to 1500mg  daily.  He is scheduled to see Dr Malen Gauze 10/22 and he wants to know when he needs to have more lab work and when he needs to f/u with Dr Donnald Garre.  Msg fwded to Dr Donnald Garre and desk RN.

## 2012-11-07 ENCOUNTER — Other Ambulatory Visit: Payer: Self-pay | Admitting: *Deleted

## 2012-11-10 ENCOUNTER — Telehealth: Payer: Self-pay | Admitting: Internal Medicine

## 2012-11-10 NOTE — Telephone Encounter (Signed)
s.w. pt wife and advised on 10.13.14 appt...ok and aware

## 2012-11-11 ENCOUNTER — Telehealth: Payer: Self-pay | Admitting: Medical Oncology

## 2012-11-11 DIAGNOSIS — D473 Essential (hemorrhagic) thrombocythemia: Secondary | ICD-10-CM

## 2012-11-11 NOTE — Telephone Encounter (Signed)
Requests cbc since hydrea increased. Will see Dr Arbutus Ped 10/13. Order put in for cbc this week.

## 2012-11-12 ENCOUNTER — Telehealth: Payer: Self-pay | Admitting: Internal Medicine

## 2012-11-12 NOTE — Telephone Encounter (Signed)
lvm for pt regarding to 10.2.14 lab only appt.Marland KitchenMarland Kitchen

## 2012-11-13 ENCOUNTER — Other Ambulatory Visit (HOSPITAL_BASED_OUTPATIENT_CLINIC_OR_DEPARTMENT_OTHER): Payer: Medicare Other

## 2012-11-13 DIAGNOSIS — D473 Essential (hemorrhagic) thrombocythemia: Secondary | ICD-10-CM

## 2012-11-13 LAB — CBC WITH DIFFERENTIAL/PLATELET
Eosinophils Absolute: 0 10*3/uL (ref 0.0–0.5)
HCT: 24.2 % — ABNORMAL LOW (ref 38.4–49.9)
HGB: 8.1 g/dL — ABNORMAL LOW (ref 13.0–17.1)
LYMPH%: 43 % (ref 14.0–49.0)
MONO#: 0.1 10*3/uL (ref 0.1–0.9)
MONO%: 4.1 % (ref 0.0–14.0)
NEUT#: 1.1 10*3/uL — ABNORMAL LOW (ref 1.5–6.5)
Platelets: 282 10*3/uL (ref 140–400)
RBC: 2.39 10*6/uL — ABNORMAL LOW (ref 4.20–5.82)
WBC: 2.2 10*3/uL — ABNORMAL LOW (ref 4.0–10.3)
lymph#: 1 10*3/uL (ref 0.9–3.3)

## 2012-11-17 ENCOUNTER — Other Ambulatory Visit: Payer: Self-pay | Admitting: Gastroenterology

## 2012-11-24 ENCOUNTER — Telehealth: Payer: Self-pay | Admitting: Internal Medicine

## 2012-11-24 ENCOUNTER — Encounter: Payer: Self-pay | Admitting: Internal Medicine

## 2012-11-24 ENCOUNTER — Other Ambulatory Visit (HOSPITAL_BASED_OUTPATIENT_CLINIC_OR_DEPARTMENT_OTHER): Payer: Medicare Other

## 2012-11-24 ENCOUNTER — Ambulatory Visit: Payer: Medicare Other

## 2012-11-24 ENCOUNTER — Telehealth: Payer: Self-pay | Admitting: *Deleted

## 2012-11-24 ENCOUNTER — Encounter (HOSPITAL_COMMUNITY)
Admission: RE | Admit: 2012-11-24 | Discharge: 2012-11-24 | Disposition: A | Discharge status: Home / Self Care | Payer: Medicare Other | Source: Ambulatory Visit | Attending: Internal Medicine | Admitting: Internal Medicine

## 2012-11-24 ENCOUNTER — Ambulatory Visit (HOSPITAL_BASED_OUTPATIENT_CLINIC_OR_DEPARTMENT_OTHER): Payer: Medicare Other | Admitting: Internal Medicine

## 2012-11-24 ENCOUNTER — Other Ambulatory Visit: Payer: Self-pay | Admitting: *Deleted

## 2012-11-24 VITALS — BP 154/79 | HR 85 | Temp 97.2°F | Resp 18 | Ht 69.0 in | Wt 140.9 lb

## 2012-11-24 DIAGNOSIS — D649 Anemia, unspecified: Secondary | ICD-10-CM

## 2012-11-24 DIAGNOSIS — D473 Essential (hemorrhagic) thrombocythemia: Secondary | ICD-10-CM

## 2012-11-24 LAB — CBC WITH DIFFERENTIAL/PLATELET
BASO%: 0.3 % (ref 0.0–2.0)
Basophils Absolute: 0 10*3/uL (ref 0.0–0.1)
Eosinophils Absolute: 0 10*3/uL (ref 0.0–0.5)
HCT: 21 % — ABNORMAL LOW (ref 38.4–49.9)
HGB: 7 g/dL — ABNORMAL LOW (ref 13.0–17.1)
LYMPH%: 47.8 % (ref 14.0–49.0)
MCH: 34.6 pg — ABNORMAL HIGH (ref 27.2–33.4)
MCHC: 33.3 g/dL (ref 32.0–36.0)
MONO#: 0.1 10*3/uL (ref 0.1–0.9)
NEUT%: 46.9 % (ref 39.0–75.0)
Platelets: 407 10*3/uL — ABNORMAL HIGH (ref 140–400)
WBC: 2.2 10*3/uL — ABNORMAL LOW (ref 4.0–10.3)
lymph#: 1 10*3/uL (ref 0.9–3.3)

## 2012-11-24 LAB — HOLD TUBE, BLOOD BANK

## 2012-11-24 LAB — ABO/RH: ABO/RH(D): O POS

## 2012-11-24 NOTE — Progress Notes (Signed)
[facility] [phone]   Fax:(336) [phone]  OFFICE PROGRESS NOTE  [name], MD [address] [place] [address]  DIAGNOSIS: Essential thrombocythemia.   PRIOR THERAPY:  1) Hydroxyurea 1000 mg alternating with 1500 mg by mouth every other day.  2) anagrelide 2.5 mg by mouth daily discontinued recently by Dr. Malen Gauze at [facility].   CURRENT THERAPY:  1) anagrelide 2.5 mg by mouth twice a day. 2) hydroxyurea 500 mg by mouth every morning and 1000 mg by mouth every PM.  2) Integra plus 1 capsule by mouth daily for iron deficiency.  3) aspirin 81 mg by mouth daily.    INTERVAL HISTORY: Thomas Cherry 67 y.o. male returns to the clinic today for follow up visit. The patient is feeling fine today except for mild fatigue and occasional dizzy spells. He denied having any significant chest pain but continues to have shortness breath with exertion. He denied having any bleeding issues. He was seen recently by Dr. Malen Gauze at [facility] and he is currently on treatment with anagrelide 2.5 mg by mouth twice a day in addition to hydroxyurea 500 mg by mouth q. AM and 1000 mg by mouth PM. He is rating his treatment fairly well with no significant adverse effects. The patient has repeat CBC and comprehensive metabolic panel performed earlier today and he is here for evaluation and discussion of his lab results. He takes his medication regularly but he missed few doses of anagrelide recently during his daughter's wedding.  MEDICAL HISTORY: Past Medical History  Diagnosis Date  . GERD (gastroesophageal reflux disease)   . Allergy     heyfever  . Hypertension   . Palpitation   . Elevated platelet count     on hydroxyurea  . Heart murmur     ALLERGIES:  has No Known Allergies.  MEDICATIONS:  Current Outpatient Prescriptions  Medication Sig Dispense Refill  . anagrelide (AGRYLIN) 0.5 MG capsule TAKE 5 CAPSULES (2.5 MG) IN THE  MORNING AND 5 CAPSULES (2.5 MG) IN THE EVENING.  300 capsule  0  . aspirin 81 MG tablet Take 81 mg by mouth daily.        Marland Kitchen diltiazem (CARDIZEM CD) 180 MG 24 hr capsule TAKE (1) CAPSULE DAILY BY MOUTH  30 capsule  2  . FeFum-FePoly-FA-B Cmp-C-Biot (INTEGRA PLUS) CAPS TAKE ONE CAPSULE BY MOUTH EVERY DAY  30 capsule  3  . folic acid (FOLVITE) 1 MG tablet Take 1 mg by mouth daily.      . hydroxyurea (HYDREA) 500 MG capsule TAKE (3) CAPSULES ONCE A DAILY, 1 tablet in the morning and 2 at night      . losartan-hydrochlorothiazide (HYZAAR) 50-12.5 MG per tablet 1/2 tab daily  15 tablet  11  . NEXIUM 40 MG capsule TAKE ONE CAPSULE BY MOUTH EVERY DAY BEFORE BREAKFAST  30 capsule  6  . NEXIUM 40 MG capsule TAKE ONE CAPSULE BY MOUTH ONCE DAILY BEFORE BREAKFAST  30 capsule  3   No current facility-administered medications for this visit.    SURGICAL HISTORY:  Past Surgical History  Procedure Laterality Date  . Hernia repair      REVIEW OF SYSTEMS:  Constitutional: positive for fatigue Eyes: negative Ears, nose, mouth, throat, and face: negative Respiratory: positive for dyspnea on exertion Cardiovascular: negative Gastrointestinal: negative Genitourinary:negative Integument/breast: negative Hematologic/lymphatic: negative Musculoskeletal:negative Neurological: positive for dizziness Behavioral/Psych: negative Endocrine: negative Allergic/Immunologic: negative  PHYSICAL EXAMINATION: General appearance: alert, cooperative, fatigued and no distress Head: Normocephalic, without obvious abnormality, atraumatic Neck: no adenopathy, no JVD, supple, symmetrical, trachea midline and thyroid not enlarged, symmetric, no tenderness/mass/nodules Lymph nodes: Cervical, supraclavicular, and axillary nodes normal. Resp: clear to auscultation bilaterally Cardio: regular rate and rhythm, S1, S2 normal, no murmur, click, rub or gallop GI: soft, non-tender; bowel sounds normal; no masses,  no  organomegaly Extremities: extremities normal, atraumatic, no cyanosis or edema Neurologic: Alert and oriented X 3, normal strength and tone. Normal symmetric reflexes. Normal coordination and gait  ECOG PERFORMANCE STATUS: 1 - Symptomatic but completely ambulatory  Blood pressure 154/79, pulse 85, temperature 97.2 F (36.2 C), temperature source Oral, resp. rate 18, height 5\' 9"  (1.753 m), weight 140 lb 14.4 oz (63.912 kg).  LABORATORY DATA: Lab Results  Component Value Date   WBC 2.2* 11/24/2012   HGB 7.0* 11/24/2012   HCT 21.0* 11/24/2012   MCV 103.9* 11/24/2012   PLT 407* 11/24/2012      Chemistry      Component Value Date/Time   NA 142 07/29/2012 1132   NA 145 04/15/2012 1142   K 4.2 07/29/2012 1132   K 4.8 04/15/2012 1142   CL 110* 07/29/2012 1132   CL 110 04/15/2012 1142   CO2 23 07/29/2012 1132   CO2 27 04/15/2012 1142   BUN 30.4* 07/29/2012 1132   BUN 27* 04/15/2012 1142   CREATININE 1.2 07/29/2012 1132   CREATININE 1.03 04/15/2012 1142      Component Value Date/Time   CALCIUM 8.7 07/29/2012 1132   CALCIUM 8.6 04/15/2012 1142   ALKPHOS 76 07/29/2012 1132   ALKPHOS 57 04/15/2012 1142   AST 21 07/29/2012 1132   AST 28 04/15/2012 1142   ALT 12 07/29/2012 1132   ALT <5 04/15/2012 1142   BILITOT 0.32 07/29/2012 1132   BILITOT 0.3 04/15/2012 1142       RADIOGRAPHIC STUDIES: No results found.  ASSESSMENT AND PLAN: this is a very pleasant 67 years old white male with essential thrombocythemia currently on treatment with anagrelide as well as hydroxyurea and tolerating his treatment fairly well. His CBC today showed a decline in his total leukocytic count as well as absolute neutrophil count in addition to anemia with hemoglobin of 7.0 G./DL. I recommended for the patient to continue his current treatment with anagrelide but I would like to reduce the dose of hydroxyurea to 500 mg by mouth twice a day because of the neutropenia and anemia. For the anemia, I will arrange for the patient to  receive 2 units of PRBCs transfusion. I will continue to monitor the patient closely with repeat CBC and comprehensive metabolic panel every 2 weeks. I would see him back for follow up visit in one month's for reevaluation. He was advised to call me immediately if he has any concerning symptoms in the interval.  The patient voices understanding of current disease status and treatment options and is in agreement with the current care plan.  All questions were answered. The patient knows to call the clinic with any problems, questions or concerns. We can certainly see the patient much sooner if necessary.

## 2012-11-24 NOTE — Telephone Encounter (Signed)
Gave pt appt for lab and for November , called Marcelino Duster regarding  PRBC this week

## 2012-11-24 NOTE — Patient Instructions (Signed)
Lab work every 2 weeks and follow up visit in one month. PRBCs transfusion in the next few days.

## 2012-11-24 NOTE — Telephone Encounter (Signed)
Per staff message and POF I have scheduled appts.  JMW  

## 2012-11-27 ENCOUNTER — Ambulatory Visit (HOSPITAL_BASED_OUTPATIENT_CLINIC_OR_DEPARTMENT_OTHER): Payer: Medicare Other

## 2012-11-27 ENCOUNTER — Other Ambulatory Visit: Payer: Self-pay | Admitting: Oncology

## 2012-11-27 VITALS — BP 131/68 | HR 64 | Temp 97.1°F | Resp 16

## 2012-11-27 DIAGNOSIS — D649 Anemia, unspecified: Secondary | ICD-10-CM

## 2012-11-27 MED ORDER — DIPHENHYDRAMINE HCL 25 MG PO CAPS
25.0000 mg | ORAL_CAPSULE | Freq: Once | ORAL | Status: AC
  Administered 2012-11-27: 25 mg via ORAL

## 2012-11-27 MED ORDER — DIPHENHYDRAMINE HCL 25 MG PO CAPS
ORAL_CAPSULE | ORAL | Status: AC
  Filled 2012-11-27: qty 1

## 2012-11-27 MED ORDER — ACETAMINOPHEN 325 MG PO TABS
ORAL_TABLET | ORAL | Status: AC
  Filled 2012-11-27: qty 2

## 2012-11-27 MED ORDER — ACETAMINOPHEN 325 MG PO TABS
650.0000 mg | ORAL_TABLET | Freq: Once | ORAL | Status: AC
  Administered 2012-11-27: 650 mg via ORAL

## 2012-11-27 MED ORDER — SODIUM CHLORIDE 0.9 % IV SOLN
250.0000 mL | Freq: Once | INTRAVENOUS | Status: AC
  Administered 2012-11-27: 250 mL via INTRAVENOUS

## 2012-11-27 NOTE — Patient Instructions (Signed)
Blood Transfusion Information WHAT IS A BLOOD TRANSFUSION? A transfusion is the replacement of blood or some of its parts. Blood is made up of multiple cells which provide different functions.  Red blood cells carry oxygen and are used for blood loss replacement.  White blood cells fight against infection.  Platelets control bleeding.  Plasma helps clot blood.  Other blood products are available for specialized needs, such as hemophilia or other clotting disorders. BEFORE THE TRANSFUSION  Who gives blood for transfusions?   You may be able to donate blood to be used at a later date on yourself (autologous donation).  Relatives can be asked to donate blood. This is generally not any safer than if you have received blood from a stranger. The same precautions are taken to ensure safety when a relative's blood is donated.  Healthy volunteers who are fully evaluated to make sure their blood is safe. This is blood bank blood. Transfusion therapy is the safest it has ever been in the practice of medicine. Before blood is taken from a donor, a complete history is taken to make sure that person has no history of diseases nor engages in risky social behavior (examples are intravenous drug use or sexual activity with multiple partners). The donor's travel history is screened to minimize risk of transmitting infections, such as malaria. The donated blood is tested for signs of infectious diseases, such as HIV and hepatitis. The blood is then tested to be sure it is compatible with you in order to minimize the chance of a transfusion reaction. If you or a relative donates blood, this is often done in anticipation of surgery and is not appropriate for emergency situations. It takes many days to process the donated blood. RISKS AND COMPLICATIONS Although transfusion therapy is very safe and saves many lives, the main dangers of transfusion include:   Getting an infectious disease.  Developing a  transfusion reaction. This is an allergic reaction to something in the blood you were given. Every precaution is taken to prevent this. The decision to have a blood transfusion has been considered carefully by your caregiver before blood is given. Blood is not given unless the benefits outweigh the risks. AFTER THE TRANSFUSION  Right after receiving a blood transfusion, you will usually feel much better and more energetic. This is especially true if your red blood cells have gotten low (anemic). The transfusion raises the level of the red blood cells which carry oxygen, and this usually causes an energy increase.  The nurse administering the transfusion will monitor you carefully for complications. HOME CARE INSTRUCTIONS  No special instructions are needed after a transfusion. You may find your energy is better. Speak with your caregiver about any limitations on activity for underlying diseases you may have. SEEK MEDICAL CARE IF:   Your condition is not improving after your transfusion.  You develop redness or irritation at the intravenous (IV) site. SEEK IMMEDIATE MEDICAL CARE IF:  Any of the following symptoms occur over the next 12 hours:  Shaking chills.  You have a temperature by mouth above 102 F (38.9 C), not controlled by medicine.  Chest, back, or muscle pain.  People around you feel you are not acting correctly or are confused.  Shortness of breath or difficulty breathing.  Dizziness and fainting.  You get a rash or develop hives.  You have a decrease in urine output.  Your urine turns a dark color or changes to pink, red, or brown. Any of the following   symptoms occur over the next 10 days:  You have a temperature by mouth above 102 F (38.9 C), not controlled by medicine.  Shortness of breath.  Weakness after normal activity.  The white part of the eye turns yellow (jaundice).  You have a decrease in the amount of urine or are urinating less often.  Your  urine turns a dark color or changes to pink, red, or brown. Document Released: 01/27/2000 Document Revised: 04/23/2011 Document Reviewed: 09/15/2007 [employer] Patient Information 2014 [employer].  

## 2012-11-28 LAB — TYPE AND SCREEN
Antibody Screen: NEGATIVE
Unit division: 0

## 2012-12-08 ENCOUNTER — Other Ambulatory Visit (HOSPITAL_BASED_OUTPATIENT_CLINIC_OR_DEPARTMENT_OTHER): Payer: Medicare Other

## 2012-12-08 DIAGNOSIS — D473 Essential (hemorrhagic) thrombocythemia: Secondary | ICD-10-CM

## 2012-12-08 LAB — COMPREHENSIVE METABOLIC PANEL (CC13)
ALT: 13 U/L (ref 0–55)
AST: 20 U/L (ref 5–34)
Albumin: 3.4 g/dL — ABNORMAL LOW (ref 3.5–5.0)
Anion Gap: 8 mEq/L (ref 3–11)
CO2: 26 mEq/L (ref 22–29)
Glucose: 102 mg/dl (ref 70–140)
Potassium: 4.4 mEq/L (ref 3.5–5.1)
Sodium: 143 mEq/L (ref 136–145)
Total Bilirubin: 0.39 mg/dL (ref 0.20–1.20)
Total Protein: 6.6 g/dL (ref 6.4–8.3)

## 2012-12-08 LAB — CBC WITH DIFFERENTIAL/PLATELET
BASO%: 0 % (ref 0.0–2.0)
EOS%: 0.5 % (ref 0.0–7.0)
Eosinophils Absolute: 0 10*3/uL (ref 0.0–0.5)
HCT: 28.4 % — ABNORMAL LOW (ref 38.4–49.9)
LYMPH%: 27.4 % (ref 14.0–49.0)
MCH: 32 pg (ref 27.2–33.4)
MCHC: 32 g/dL (ref 32.0–36.0)
MONO#: 0.3 10*3/uL (ref 0.1–0.9)
NEUT#: 2.7 10*3/uL (ref 1.5–6.5)
RBC: 2.84 10*6/uL — ABNORMAL LOW (ref 4.20–5.82)
RDW: 23.5 % — ABNORMAL HIGH (ref 11.0–14.6)
WBC: 4.1 10*3/uL (ref 4.0–10.3)
lymph#: 1.1 10*3/uL (ref 0.9–3.3)

## 2012-12-22 ENCOUNTER — Other Ambulatory Visit: Payer: Medicare Other

## 2012-12-29 ENCOUNTER — Encounter (INDEPENDENT_AMBULATORY_CARE_PROVIDER_SITE_OTHER): Payer: Self-pay

## 2012-12-29 ENCOUNTER — Encounter: Payer: Self-pay | Admitting: Internal Medicine

## 2012-12-29 ENCOUNTER — Telehealth: Payer: Self-pay | Admitting: Internal Medicine

## 2012-12-29 ENCOUNTER — Ambulatory Visit (HOSPITAL_BASED_OUTPATIENT_CLINIC_OR_DEPARTMENT_OTHER): Payer: Medicare Other | Admitting: Internal Medicine

## 2012-12-29 ENCOUNTER — Other Ambulatory Visit (HOSPITAL_BASED_OUTPATIENT_CLINIC_OR_DEPARTMENT_OTHER): Payer: Medicare Other

## 2012-12-29 VITALS — BP 152/77 | HR 79 | Temp 97.0°F | Resp 18 | Ht 69.0 in | Wt 145.1 lb

## 2012-12-29 DIAGNOSIS — D473 Essential (hemorrhagic) thrombocythemia: Secondary | ICD-10-CM

## 2012-12-29 LAB — COMPREHENSIVE METABOLIC PANEL (CC13)
ALT: 15 U/L (ref 0–55)
Albumin: 3.6 g/dL (ref 3.5–5.0)
Alkaline Phosphatase: 59 U/L (ref 40–150)
CO2: 26 mEq/L (ref 22–29)
Glucose: 96 mg/dl (ref 70–140)
Potassium: 4 mEq/L (ref 3.5–5.1)
Sodium: 144 mEq/L (ref 136–145)
Total Bilirubin: 0.4 mg/dL (ref 0.20–1.20)
Total Protein: 6.6 g/dL (ref 6.4–8.3)

## 2012-12-29 LAB — CBC WITH DIFFERENTIAL/PLATELET
BASO%: 0.5 % (ref 0.0–2.0)
EOS%: 0.9 % (ref 0.0–7.0)
Eosinophils Absolute: 0 10*3/uL (ref 0.0–0.5)
HGB: 8.6 g/dL — ABNORMAL LOW (ref 13.0–17.1)
LYMPH%: 24.7 % (ref 14.0–49.0)
MCHC: 32.6 g/dL (ref 32.0–36.0)
MCV: 103.2 fL — ABNORMAL HIGH (ref 79.3–98.0)
MONO#: 0.4 10*3/uL (ref 0.1–0.9)
NEUT#: 3.2 10*3/uL (ref 1.5–6.5)
Platelets: 709 10*3/uL — ABNORMAL HIGH (ref 140–400)
RBC: 2.55 10*6/uL — ABNORMAL LOW (ref 4.20–5.82)
RDW: 24.6 % — ABNORMAL HIGH (ref 11.0–14.6)
WBC: 4.8 10*3/uL (ref 4.0–10.3)
lymph#: 1.2 10*3/uL (ref 0.9–3.3)

## 2012-12-29 NOTE — Patient Instructions (Signed)
Follow up visit in one month with repeat CBC and comprehensive metabolic panel 

## 2012-12-29 NOTE — Progress Notes (Signed)
[facility] Telephone:(336) [phone]   [phone]) [phone]  OFFICE PROGRESS NOTE  [name], MD [address] [place] [address]  DIAGNOSIS: Essential thrombocythemia.   PRIOR THERAPY:  1) Hydroxyurea 1000 mg alternating with 1500 mg by mouth every other day.  2) anagrelide 2.5 mg by mouth daily discontinued recently by Dr. Malen Gauze at [facility].   CURRENT THERAPY:  1) anagrelide 2.5 mg by mouth twice a day. 2) hydroxyurea 500 mg by mouth every morning and 1000 mg by mouth every PM.  2) Integra plus 1 capsule by mouth daily for iron deficiency.  3) aspirin 81 mg by mouth daily.    INTERVAL HISTORY: Thomas Cherry 67 y.o. male returns to the clinic today for follow up visit. The patient is feeling fine today except for mild fatigue. He denied having any significant chest pain but continues to have shortness of breath with exertion. He denied having any bleeding issues. He is currently on treatment with anagrelide 2.5 mg by mouth twice a day in addition to hydroxyurea 500 mg by mouth twice a day. He is tolerating his treatment fairly well with no significant adverse effects. The patient has repeat CBC and comprehensive metabolic panel performed earlier today and he is here for evaluation and discussion of his lab results.  MEDICAL HISTORY: Past Medical History  Diagnosis Date  . GERD (gastroesophageal reflux disease)   . Allergy     heyfever  . Hypertension   . Palpitation   . Elevated platelet count     on hydroxyurea  . Heart murmur     ALLERGIES:  has No Known Allergies.  MEDICATIONS:  Current Outpatient Prescriptions  Medication Sig Dispense Refill  . anagrelide (AGRYLIN) 0.5 MG capsule TAKE 5 CAPSULES (2.5 MG) IN THE MORNING AND 5 CAPSULES (2.5 MG) IN THE EVENING.  300 capsule  0  . aspirin 81 MG tablet Take 81 mg by mouth daily.        Marland Kitchen diltiazem (CARDIZEM CD) 180 MG 24 hr capsule TAKE (1) CAPSULE DAILY BY MOUTH  30  capsule  2  . esomeprazole (NEXIUM) 40 MG capsule TAKE ONE CAPSULE BY MOUTH EVERY OTHER DAY BEFORE BREAKFAST      . folic acid (FOLVITE) 1 MG tablet Take 1 mg by mouth daily.      . hydroxyurea (HYDREA) 500 MG capsule 1 tablet in the morning and 1 at night      . losartan-hydrochlorothiazide (HYZAAR) 50-12.5 MG per tablet 1/2 tab daily  15 tablet  11   No current facility-administered medications for this visit.    SURGICAL HISTORY:  Past Surgical History  Procedure Laterality Date  . Hernia repair      REVIEW OF SYSTEMS:  Constitutional: positive for fatigue Eyes: negative Ears, nose, mouth, throat, and face: negative Respiratory: positive for dyspnea on exertion Cardiovascular: negative Gastrointestinal: negative Genitourinary:negative Integument/breast: negative Hematologic/lymphatic: negative Musculoskeletal:negative Neurological: positive for dizziness Behavioral/Psych: negative Endocrine: negative Allergic/Immunologic: negative   PHYSICAL EXAMINATION: General appearance: alert, cooperative, fatigued and no distress Head: Normocephalic, without obvious abnormality, atraumatic Neck: no adenopathy, no JVD, supple, symmetrical, trachea midline and thyroid not enlarged, symmetric, no tenderness/mass/nodules Lymph nodes: Cervical, supraclavicular, and axillary nodes normal. Resp: clear to auscultation bilaterally Cardio: regular rate and rhythm, S1, S2 normal, no murmur, click, rub or gallop GI: soft, non-tender; bowel sounds normal; no masses,  no organomegaly Extremities: extremities normal, atraumatic, no cyanosis or edema Neurologic: Alert and  oriented X 3, normal strength and tone. Normal symmetric reflexes. Normal coordination and gait  ECOG PERFORMANCE STATUS: 1 - Symptomatic but completely ambulatory  Blood pressure 152/77, pulse 79, temperature 97 F (36.1 C), temperature source Oral, resp. rate 18, height 5\' 9"  (1.753 m), weight 145 lb 1.6 oz (65.817  kg).  LABORATORY DATA: Lab Results  Component Value Date   WBC 4.8 12/29/2012   HGB 8.6* 12/29/2012   HCT 26.3* 12/29/2012   MCV 103.2* 12/29/2012   PLT 709* 12/29/2012      Chemistry      Component Value Date/Time   NA 144 12/29/2012 0927   NA 145 04/15/2012 1142   K 4.0 12/29/2012 0927   K 4.8 04/15/2012 1142   CL 110* 07/29/2012 1132   CL 110 04/15/2012 1142   CO2 26 12/29/2012 0927   CO2 27 04/15/2012 1142   BUN 30.9* 12/29/2012 0927   BUN 27* 04/15/2012 1142   CREATININE 1.4* 12/29/2012 0927   CREATININE 1.03 04/15/2012 1142      Component Value Date/Time   CALCIUM 9.3 12/29/2012 0927   CALCIUM 8.6 04/15/2012 1142   ALKPHOS 59 12/29/2012 0927   ALKPHOS 57 04/15/2012 1142   AST 20 12/29/2012 0927   AST 28 04/15/2012 1142   ALT 15 12/29/2012 0927   ALT <5 04/15/2012 1142   BILITOT 0.40 12/29/2012 0927   BILITOT 0.3 04/15/2012 1142       RADIOGRAPHIC STUDIES: No results found.  ASSESSMENT AND PLAN: this is a very pleasant 67 years old white male with essential thrombocythemia currently on treatment with anagrelide as well as hydroxyurea and tolerating his treatment fairly well. His platelets count has increased on the recent blood work. I recommended for the patient to continue his current treatment with anagrelide but I would increase his dose of hydroxyurea to 500 mg by mouth AM and 1000 mg by mouth Q PM. I will continue to monitor the patient closely with repeat CBC and comprehensive metabolic panel in 2 weeks. I would see him back for follow up visit in one month for reevaluation. He was advised to call me immediately if he has any concerning symptoms in the interval.  The patient voices understanding of current disease status and treatment options and is in agreement with the current care plan.  All questions were answered. The patient knows to call the clinic with any problems, questions or concerns. We can certainly see the patient much sooner if necessary.

## 2012-12-29 NOTE — Telephone Encounter (Signed)
lmonvm for pt re next appt for 12/3 and mailed remaining appts.

## 2013-01-05 ENCOUNTER — Other Ambulatory Visit: Payer: Medicare Other

## 2013-01-12 ENCOUNTER — Other Ambulatory Visit: Payer: Self-pay | Admitting: Endocrinology

## 2013-01-12 ENCOUNTER — Other Ambulatory Visit: Payer: Self-pay | Admitting: Internal Medicine

## 2013-01-14 ENCOUNTER — Encounter (INDEPENDENT_AMBULATORY_CARE_PROVIDER_SITE_OTHER): Payer: Self-pay

## 2013-01-14 ENCOUNTER — Other Ambulatory Visit (HOSPITAL_BASED_OUTPATIENT_CLINIC_OR_DEPARTMENT_OTHER): Payer: Medicare Other

## 2013-01-14 DIAGNOSIS — D473 Essential (hemorrhagic) thrombocythemia: Secondary | ICD-10-CM

## 2013-01-14 LAB — CBC WITH DIFFERENTIAL/PLATELET
BASO%: 0.3 % (ref 0.0–2.0)
EOS%: 0.7 % (ref 0.0–7.0)
HGB: 7.3 g/dL — ABNORMAL LOW (ref 13.0–17.1)
LYMPH%: 35.4 % (ref 14.0–49.0)
MCHC: 33.3 g/dL (ref 32.0–36.0)
MONO#: 0.1 10*3/uL (ref 0.1–0.9)
MONO%: 3.8 % (ref 0.0–14.0)
Platelets: 433 10*3/uL — ABNORMAL HIGH (ref 140–400)
RBC: 2.09 10*6/uL — ABNORMAL LOW (ref 4.20–5.82)
RDW: 25 % — ABNORMAL HIGH (ref 11.0–14.6)
WBC: 2.6 10*3/uL — ABNORMAL LOW (ref 4.0–10.3)

## 2013-01-14 LAB — COMPREHENSIVE METABOLIC PANEL (CC13)
ALT: 12 U/L (ref 0–55)
AST: 21 U/L (ref 5–34)
Anion Gap: 10 mEq/L (ref 3–11)
CO2: 25 mEq/L (ref 22–29)
Calcium: 8.9 mg/dL (ref 8.4–10.4)
Chloride: 108 mEq/L (ref 98–109)
Sodium: 144 mEq/L (ref 136–145)
Total Protein: 6.5 g/dL (ref 6.4–8.3)

## 2013-01-14 NOTE — Progress Notes (Signed)
Quick Note:  Call patient with the result and consider 2 units of PRBCs on Friday 12/5 ______

## 2013-01-15 ENCOUNTER — Other Ambulatory Visit: Payer: Self-pay | Admitting: Medical Oncology

## 2013-01-15 ENCOUNTER — Other Ambulatory Visit: Payer: Medicare Other

## 2013-01-15 ENCOUNTER — Telehealth: Payer: Self-pay | Admitting: Medical Oncology

## 2013-01-15 ENCOUNTER — Other Ambulatory Visit: Payer: Medicare Other | Admitting: Lab

## 2013-01-15 ENCOUNTER — Ambulatory Visit (HOSPITAL_COMMUNITY)
Admission: RE | Admit: 2013-01-15 | Discharge: 2013-01-15 | Disposition: A | Discharge status: Home / Self Care | Payer: Medicare Other | Source: Ambulatory Visit | Attending: Internal Medicine | Admitting: Internal Medicine

## 2013-01-15 DIAGNOSIS — D649 Anemia, unspecified: Secondary | ICD-10-CM | POA: Insufficient documentation

## 2013-01-15 LAB — HOLD TUBE, BLOOD BANK

## 2013-01-15 NOTE — Telephone Encounter (Signed)
Message copied by Charma Igo on Thu Jan 15, 2013  9:50 AM ------      Message from: Thomas Cherry      Created: Wed Jan 14, 2013  5:52 PM       Call patient with the result and consider 2 units of PRBCs on Friday 12/5 ------

## 2013-01-15 NOTE — Progress Notes (Signed)
Har done 

## 2013-01-15 NOTE — Telephone Encounter (Signed)
I left message for Thomas Cherry to call back re blood transfusion today or tomorrow.

## 2013-01-16 ENCOUNTER — Ambulatory Visit (HOSPITAL_BASED_OUTPATIENT_CLINIC_OR_DEPARTMENT_OTHER): Payer: Medicare Other

## 2013-01-16 ENCOUNTER — Other Ambulatory Visit: Payer: Self-pay | Admitting: *Deleted

## 2013-01-16 VITALS — BP 134/73 | HR 72 | Temp 98.8°F | Resp 18

## 2013-01-16 DIAGNOSIS — D649 Anemia, unspecified: Secondary | ICD-10-CM

## 2013-01-16 LAB — PREPARE RBC (CROSSMATCH)

## 2013-01-16 MED ORDER — DIPHENHYDRAMINE HCL 25 MG PO CAPS
ORAL_CAPSULE | ORAL | Status: AC
  Filled 2013-01-16: qty 1

## 2013-01-16 MED ORDER — SODIUM CHLORIDE 0.9 % IV SOLN
250.0000 mL | Freq: Once | INTRAVENOUS | Status: AC
  Administered 2013-01-16: 250 mL via INTRAVENOUS

## 2013-01-16 MED ORDER — ACETAMINOPHEN 325 MG PO TABS
650.0000 mg | ORAL_TABLET | Freq: Once | ORAL | Status: AC
  Administered 2013-01-16: 650 mg via ORAL

## 2013-01-16 MED ORDER — DIPHENHYDRAMINE HCL 25 MG PO CAPS
25.0000 mg | ORAL_CAPSULE | Freq: Once | ORAL | Status: AC
  Administered 2013-01-16: 25 mg via ORAL

## 2013-01-16 MED ORDER — ACETAMINOPHEN 325 MG PO TABS
ORAL_TABLET | ORAL | Status: AC
  Filled 2013-01-16: qty 2

## 2013-01-16 NOTE — Patient Instructions (Signed)
Blood Transfusion Information WHAT IS A BLOOD TRANSFUSION? A transfusion is the replacement of blood or some of its parts. Blood is made up of multiple cells which provide different functions.  Red blood cells carry oxygen and are used for blood loss replacement.  White blood cells fight against infection.  Platelets control bleeding.  Plasma helps clot blood.  Other blood products are available for specialized needs, such as hemophilia or other clotting disorders. BEFORE THE TRANSFUSION  Who gives blood for transfusions?   You may be able to donate blood to be used at a later date on yourself (autologous donation).  Relatives can be asked to donate blood. This is generally not any safer than if you have received blood from a stranger. The same precautions are taken to ensure safety when a relative's blood is donated.  Healthy volunteers who are fully evaluated to make sure their blood is safe. This is blood bank blood. Transfusion therapy is the safest it has ever been in the practice of medicine. Before blood is taken from a donor, a complete history is taken to make sure that person has no history of diseases nor engages in risky social behavior (examples are intravenous drug use or sexual activity with multiple partners). The donor's travel history is screened to minimize risk of transmitting infections, such as malaria. The donated blood is tested for signs of infectious diseases, such as HIV and hepatitis. The blood is then tested to be sure it is compatible with you in order to minimize the chance of a transfusion reaction. If you or a relative donates blood, this is often done in anticipation of surgery and is not appropriate for emergency situations. It takes many days to process the donated blood. RISKS AND COMPLICATIONS Although transfusion therapy is very safe and saves many lives, the main dangers of transfusion include:   Getting an infectious disease.  Developing a  transfusion reaction. This is an allergic reaction to something in the blood you were given. Every precaution is taken to prevent this. The decision to have a blood transfusion has been considered carefully by your caregiver before blood is given. Blood is not given unless the benefits outweigh the risks. AFTER THE TRANSFUSION  Right after receiving a blood transfusion, you will usually feel much better and more energetic. This is especially true if your red blood cells have gotten low (anemic). The transfusion raises the level of the red blood cells which carry oxygen, and this usually causes an energy increase.  The nurse administering the transfusion will monitor you carefully for complications. HOME CARE INSTRUCTIONS  No special instructions are needed after a transfusion. You may find your energy is better. Speak with your caregiver about any limitations on activity for underlying diseases you may have. SEEK MEDICAL CARE IF:   Your condition is not improving after your transfusion.  You develop redness or irritation at the intravenous (IV) site. SEEK IMMEDIATE MEDICAL CARE IF:  Any of the following symptoms occur over the next 12 hours:  Shaking chills.  You have a temperature by mouth above 102 F (38.9 C), not controlled by medicine.  Chest, back, or muscle pain.  People around you feel you are not acting correctly or are confused.  Shortness of breath or difficulty breathing.  Dizziness and fainting.  You get a rash or develop hives.  You have a decrease in urine output.  Your urine turns a dark color or changes to pink, red, or brown. Any of the following   symptoms occur over the next 10 days:  You have a temperature by mouth above 102 F (38.9 C), not controlled by medicine.  Shortness of breath.  Weakness after normal activity.  The white part of the eye turns yellow (jaundice).  You have a decrease in the amount of urine or are urinating less often.  Your  urine turns a dark color or changes to pink, red, or brown. Document Released: 01/27/2000 Document Revised: 04/23/2011 Document Reviewed: 09/15/2007 [employer] Patient Information 2014 [employer].  

## 2013-01-17 LAB — TYPE AND SCREEN
ABO/RH(D): O POS
Unit division: 0

## 2013-01-19 ENCOUNTER — Other Ambulatory Visit: Payer: Medicare Other

## 2013-01-21 ENCOUNTER — Other Ambulatory Visit: Payer: Self-pay | Admitting: Internal Medicine

## 2013-01-21 ENCOUNTER — Telehealth: Payer: Self-pay | Admitting: Medical Oncology

## 2013-01-22 NOTE — Telephone Encounter (Signed)
Erroneous

## 2013-01-26 ENCOUNTER — Other Ambulatory Visit: Payer: Self-pay | Admitting: Endocrinology

## 2013-01-28 ENCOUNTER — Other Ambulatory Visit (HOSPITAL_BASED_OUTPATIENT_CLINIC_OR_DEPARTMENT_OTHER): Payer: Medicare Other

## 2013-01-28 ENCOUNTER — Encounter: Payer: Self-pay | Admitting: Internal Medicine

## 2013-01-28 ENCOUNTER — Telehealth: Payer: Self-pay | Admitting: Internal Medicine

## 2013-01-28 ENCOUNTER — Ambulatory Visit (HOSPITAL_BASED_OUTPATIENT_CLINIC_OR_DEPARTMENT_OTHER): Payer: Medicare Other | Admitting: Internal Medicine

## 2013-01-28 VITALS — BP 167/74 | HR 82 | Temp 97.4°F | Resp 18 | Ht 69.0 in | Wt 144.2 lb

## 2013-01-28 DIAGNOSIS — D473 Essential (hemorrhagic) thrombocythemia: Secondary | ICD-10-CM

## 2013-01-28 LAB — CBC WITH DIFFERENTIAL/PLATELET
Basophils Absolute: 0 10*3/uL (ref 0.0–0.1)
Eosinophils Absolute: 0 10*3/uL (ref 0.0–0.5)
HCT: 26.1 % — ABNORMAL LOW (ref 38.4–49.9)
HGB: 8.8 g/dL — ABNORMAL LOW (ref 13.0–17.1)
MCV: 101.4 fL — ABNORMAL HIGH (ref 79.3–98.0)
MONO%: 3.3 % (ref 0.0–14.0)
NEUT#: 1.4 10*3/uL — ABNORMAL LOW (ref 1.5–6.5)
NEUT%: 56.6 % (ref 39.0–75.0)
Platelets: 303 10*3/uL (ref 140–400)
RBC: 2.57 10*6/uL — ABNORMAL LOW (ref 4.20–5.82)
RDW: 25.7 % — ABNORMAL HIGH (ref 11.0–14.6)
lymph#: 0.9 10*3/uL (ref 0.9–3.3)

## 2013-01-28 LAB — COMPREHENSIVE METABOLIC PANEL (CC13)
Albumin: 3.7 g/dL (ref 3.5–5.0)
Alkaline Phosphatase: 53 U/L (ref 40–150)
BUN: 33.8 mg/dL — ABNORMAL HIGH (ref 7.0–26.0)
CO2: 28 mEq/L (ref 22–29)
Calcium: 9.2 mg/dL (ref 8.4–10.4)
Chloride: 110 mEq/L — ABNORMAL HIGH (ref 98–109)
Creatinine: 1.3 mg/dL (ref 0.7–1.3)
Glucose: 97 mg/dl (ref 70–140)
Potassium: 4.4 mEq/L (ref 3.5–5.1)

## 2013-01-28 NOTE — Telephone Encounter (Signed)
gv pt appt schedule for december and january. per 12/17 pof sent to scheduling and attached to office visit. lab w/next visit and 12/30 as ordered. lb scheduled for 12/31 and remains at this date

## 2013-01-28 NOTE — Progress Notes (Signed)
[facility] [phone]   Fax:(336) [phone]  OFFICE PROGRESS NOTE  [name], MD [address] [place] [address]  DIAGNOSIS: Essential thrombocythemia.   PRIOR THERAPY:  1) Hydroxyurea 1000 mg alternating with 1500 mg by mouth every other day.  2) anagrelide 2.5 mg by mouth daily discontinued recently by Dr. Malen Gauze at [facility].   CURRENT THERAPY:  1) anagrelide 2.5 mg by mouth twice a day. 2) hydroxyurea 500 mg by mouth every morning and 1000 mg by mouth every PM.  2) Integra plus 1 capsule by mouth daily for iron deficiency.  3) aspirin 81 mg by mouth daily.    INTERVAL HISTORY: Thomas Cherry 67 y.o. male returns to the clinic today for follow up visit. The patient is feeling fine today except for mild fatigue. He denied having any significant chest pain, or shortness of breath with exertion. He denied having any bleeding issues. He is currently on treatment with anagrelide 2.5 mg by mouth twice a day in addition to hydroxyurea 500 mg by mouth AM and 1000 mg by mouth PM. He is tolerating his treatment fairly well with no significant adverse effects. He has a new granddaughter burns 6 days ago and he is very excited. The patient has repeat CBC and comprehensive metabolic panel performed earlier today and he is here for evaluation and discussion of his lab results.  MEDICAL HISTORY: Past Medical History  Diagnosis Date  . GERD (gastroesophageal reflux disease)   . Allergy     heyfever  . Hypertension   . Palpitation   . Elevated platelet count     on hydroxyurea  . Heart murmur     ALLERGIES:  has No Known Allergies.  MEDICATIONS:  Current Outpatient Prescriptions  Medication Sig Dispense Refill  . anagrelide (AGRYLIN) 0.5 MG capsule TAKE 5 CAPSULES EVERY MORNING AND 5 EVERY EVENING.  300 capsule  1  . aspirin 81 MG tablet Take 81 mg by mouth daily.        Marland Kitchen diltiazem (CARDIZEM CD) 180 MG 24 hr capsule TAKE  (1) CAPSULE DAILY BY MOUTH  90 capsule  3  . esomeprazole (NEXIUM) 40 MG capsule TAKE ONE CAPSULE BY MOUTH EVERY OTHER DAY BEFORE BREAKFAST      . FeFum-FePoly-FA-B Cmp-C-Biot (INTEGRA PLUS) CAPS TAKE ONE CAPSULE BY MOUTH ONCE DAILY.  30 capsule  0  . folic acid (FOLVITE) 1 MG tablet Take 1 mg by mouth daily.      . hydroxyurea (HYDREA) 500 MG capsule 1 tablet in the morning and 2 at night      . losartan-hydrochlorothiazide (HYZAAR) 50-12.5 MG per tablet TAKE 1/2 TABLET ONCE DAILY  45 tablet  2   No current facility-administered medications for this visit.    SURGICAL HISTORY:  Past Surgical History  Procedure Laterality Date  . Hernia repair      REVIEW OF SYSTEMS:  Constitutional: positive for fatigue Eyes: negative Ears, nose, mouth, throat, and face: negative Respiratory: positive for dyspnea on exertion Cardiovascular: negative Gastrointestinal: negative Genitourinary:negative Integument/breast: negative Hematologic/lymphatic: negative Musculoskeletal:negative Neurological: positive for dizziness Behavioral/Psych: negative Endocrine: negative Allergic/Immunologic: negative   PHYSICAL EXAMINATION: General appearance: alert, cooperative, fatigued and no distress Head: Normocephalic, without obvious abnormality, atraumatic Neck: no adenopathy, no JVD, supple, symmetrical, trachea midline and thyroid not enlarged, symmetric, no tenderness/mass/nodules Lymph nodes: Cervical, supraclavicular, and axillary nodes normal. Resp: clear to auscultation bilaterally Cardio: regular rate and rhythm, S1, S2  normal, no murmur, click, rub or gallop GI: soft, non-tender; bowel sounds normal; no masses,  no organomegaly Extremities: extremities normal, atraumatic, no cyanosis or edema Neurologic: Alert and oriented X 3, normal strength and tone. Normal symmetric reflexes. Normal coordination and gait  ECOG PERFORMANCE STATUS: 1 - Symptomatic but completely ambulatory  There were no  vitals taken for this visit.  LABORATORY DATA: Lab Results  Component Value Date   WBC 2.4* 01/28/2013   HGB 8.8* 01/28/2013   HCT 26.1* 01/28/2013   MCV 101.4* 01/28/2013   PLT 303 01/28/2013      Chemistry      Component Value Date/Time   NA 144 01/14/2013 1322   NA 145 04/15/2012 1142   K 4.2 01/14/2013 1322   K 4.8 04/15/2012 1142   CL 110* 07/29/2012 1132   CL 110 04/15/2012 1142   CO2 25 01/14/2013 1322   CO2 27 04/15/2012 1142   BUN 29.4* 01/14/2013 1322   BUN 27* 04/15/2012 1142   CREATININE 1.3 01/14/2013 1322   CREATININE 1.03 04/15/2012 1142      Component Value Date/Time   CALCIUM 8.9 01/14/2013 1322   CALCIUM 8.6 04/15/2012 1142   ALKPHOS 58 01/14/2013 1322   ALKPHOS 57 04/15/2012 1142   AST 21 01/14/2013 1322   AST 28 04/15/2012 1142   ALT 12 01/14/2013 1322   ALT <5 04/15/2012 1142   BILITOT 0.39 01/14/2013 1322   BILITOT 0.3 04/15/2012 1142       RADIOGRAPHIC STUDIES: No results found.  ASSESSMENT AND PLAN: this is a very pleasant 67 years old white male with essential thrombocythemia currently on treatment with anagrelide as well as hydroxyurea and tolerating his treatment fairly well. His platelets count has increased on the recent blood work. I recommended for the patient to continue his current treatment with anagrelide to 2.5 mg by mouth twice a day in addition to hydroxyurea to 500 mg by mouth AM and 1000 mg by mouth Q PM. I will continue to monitor the patient closely with repeat CBC and comprehensive metabolic panel in 2 weeks. I would see him back for follow up visit in one month for reevaluation. He was advised to call me immediately if he has any concerning symptoms in the interval.  The patient voices understanding of current disease status and treatment options and is in agreement with the current care plan.  All questions were answered. The patient knows to call the clinic with any problems, questions or concerns. We can certainly see the patient much sooner if  necessary.

## 2013-01-28 NOTE — Patient Instructions (Signed)
Follow up visit in one month.  

## 2013-02-02 ENCOUNTER — Other Ambulatory Visit: Payer: Medicare Other

## 2013-02-11 ENCOUNTER — Other Ambulatory Visit (HOSPITAL_BASED_OUTPATIENT_CLINIC_OR_DEPARTMENT_OTHER): Payer: Medicare Other

## 2013-02-11 ENCOUNTER — Telehealth: Payer: Self-pay | Admitting: *Deleted

## 2013-02-11 DIAGNOSIS — D473 Essential (hemorrhagic) thrombocythemia: Secondary | ICD-10-CM

## 2013-02-11 LAB — CBC WITH DIFFERENTIAL/PLATELET
BASO%: 0.4 % (ref 0.0–2.0)
Basophils Absolute: 0 10*3/uL (ref 0.0–0.1)
EOS%: 0.6 % (ref 0.0–7.0)
Eosinophils Absolute: 0 10*3/uL (ref 0.0–0.5)
HGB: 7.6 g/dL — ABNORMAL LOW (ref 13.0–17.1)
LYMPH%: 37.1 % (ref 14.0–49.0)
MCH: 34.1 pg — ABNORMAL HIGH (ref 27.2–33.4)
MCHC: 33.9 g/dL (ref 32.0–36.0)
MCV: 100.5 fL — ABNORMAL HIGH (ref 79.3–98.0)
MONO%: 4.1 % (ref 0.0–14.0)
NEUT#: 1.2 10*3/uL — ABNORMAL LOW (ref 1.5–6.5)
NEUT%: 57.8 % (ref 39.0–75.0)
Platelets: 313 10*3/uL (ref 140–400)
RBC: 2.23 10*6/uL — ABNORMAL LOW (ref 4.20–5.82)
lymph#: 0.8 10*3/uL — ABNORMAL LOW (ref 0.9–3.3)

## 2013-02-11 LAB — COMPREHENSIVE METABOLIC PANEL (CC13)
AST: 19 U/L (ref 5–34)
Albumin: 3.6 g/dL (ref 3.5–5.0)
Alkaline Phosphatase: 50 U/L (ref 40–150)
BUN: 33.4 mg/dL — ABNORMAL HIGH (ref 7.0–26.0)
Chloride: 108 mEq/L (ref 98–109)
Creatinine: 1.7 mg/dL — ABNORMAL HIGH (ref 0.7–1.3)
Glucose: 109 mg/dl (ref 70–140)
Total Bilirubin: 0.43 mg/dL (ref 0.20–1.20)

## 2013-02-11 NOTE — Telephone Encounter (Signed)
Pt called wanting to see if he needs to change his medications at all based on CBC today.  Per Dr Donnald Garre, pt can keep the same doses of his medications.  Pt verbalized understanding.  SLJ

## 2013-02-11 NOTE — Progress Notes (Signed)
Quick Note:  Call patient with the result and arrange for him to receive 2 units of PRBCs transfusion in the next few days. ______

## 2013-02-13 ENCOUNTER — Telehealth: Payer: Self-pay | Admitting: Medical Oncology

## 2013-02-13 ENCOUNTER — Telehealth: Payer: Self-pay | Admitting: Internal Medicine

## 2013-02-13 NOTE — Telephone Encounter (Signed)
Message copied by Ardeen Garland on Fri Feb 13, 2013 11:45 AM ------      Message from: Britt Bottom      Created: Wed Feb 11, 2013  4:55 PM       Mikeala Girdler--can you f/u with this.  Sharyn Lull was already gone by the time I got this.                     ----- Message -----         From: Curt Bears, MD         Sent: 02/11/2013   2:15 PM           To: Carlton Adam, PA-C, #            Call patient with the result and arrange for him to receive 2 units of PRBCs transfusion in the next few days.       ------

## 2013-02-13 NOTE — Telephone Encounter (Signed)
per 1.2.14 pof pt going to [facility]

## 2013-02-13 NOTE — Telephone Encounter (Signed)
Pt does not want blood transfusion ' i feel fine". He is going to see Dr Royce Macadamia on wed 02/18/13-"[facility] will  do blood work and I will see what he says and call you back".  I cancelled lab and blood transfusion for mon and wed of next week. Pt instructed to go to [facility] if he starts feeling weak , light headed or dizzy.

## 2013-02-16 ENCOUNTER — Other Ambulatory Visit: Payer: Medicare Other

## 2013-02-25 ENCOUNTER — Ambulatory Visit (HOSPITAL_BASED_OUTPATIENT_CLINIC_OR_DEPARTMENT_OTHER): Payer: Medicare Other | Admitting: Physician Assistant

## 2013-02-25 ENCOUNTER — Ambulatory Visit: Payer: Medicare Other

## 2013-02-25 ENCOUNTER — Encounter: Payer: Self-pay | Admitting: Physician Assistant

## 2013-02-25 ENCOUNTER — Other Ambulatory Visit (HOSPITAL_BASED_OUTPATIENT_CLINIC_OR_DEPARTMENT_OTHER): Payer: Medicare Other

## 2013-02-25 ENCOUNTER — Telehealth: Payer: Self-pay | Admitting: Internal Medicine

## 2013-02-25 ENCOUNTER — Ambulatory Visit (HOSPITAL_COMMUNITY)
Admission: RE | Admit: 2013-02-25 | Discharge: 2013-02-25 | Disposition: A | Discharge status: Home / Self Care | Payer: Medicare Other | Source: Ambulatory Visit | Attending: Internal Medicine | Admitting: Internal Medicine

## 2013-02-25 VITALS — BP 148/60 | HR 94 | Temp 97.5°F | Resp 18 | Ht 69.0 in | Wt 149.6 lb

## 2013-02-25 DIAGNOSIS — D473 Essential (hemorrhagic) thrombocythemia: Secondary | ICD-10-CM

## 2013-02-25 DIAGNOSIS — D75839 Thrombocytosis, unspecified: Secondary | ICD-10-CM

## 2013-02-25 DIAGNOSIS — D649 Anemia, unspecified: Secondary | ICD-10-CM

## 2013-02-25 LAB — CBC WITH DIFFERENTIAL/PLATELET
BASO%: 0.3 % (ref 0.0–2.0)
Basophils Absolute: 0 10*3/uL (ref 0.0–0.1)
EOS%: 0.9 % (ref 0.0–7.0)
Eosinophils Absolute: 0 10*3/uL (ref 0.0–0.5)
HCT: 20.9 % — ABNORMAL LOW (ref 38.4–49.9)
HGB: 7.1 g/dL — ABNORMAL LOW (ref 13.0–17.1)
LYMPH#: 0.9 10*3/uL (ref 0.9–3.3)
LYMPH%: 23.5 % (ref 14.0–49.0)
MCH: 36.1 pg — AB (ref 27.2–33.4)
MCHC: 34.1 g/dL (ref 32.0–36.0)
MCV: 105.9 fL — AB (ref 79.3–98.0)
MONO#: 0.3 10*3/uL (ref 0.1–0.9)
MONO%: 7 % (ref 0.0–14.0)
NEUT#: 2.6 10*3/uL (ref 1.5–6.5)
NEUT%: 68.3 % (ref 39.0–75.0)
Platelets: 592 10*3/uL — ABNORMAL HIGH (ref 140–400)
RBC: 1.98 10*6/uL — ABNORMAL LOW (ref 4.20–5.82)
RDW: 29.4 % — AB (ref 11.0–14.6)
WBC: 3.8 10*3/uL — ABNORMAL LOW (ref 4.0–10.3)

## 2013-02-25 LAB — COMPREHENSIVE METABOLIC PANEL (CC13)
ALK PHOS: 55 U/L (ref 40–150)
ALT: 13 U/L (ref 0–55)
AST: 23 U/L (ref 5–34)
Albumin: 3.6 g/dL (ref 3.5–5.0)
Anion Gap: 8 mEq/L (ref 3–11)
BUN: 30.3 mg/dL — ABNORMAL HIGH (ref 7.0–26.0)
CALCIUM: 8.6 mg/dL (ref 8.4–10.4)
CHLORIDE: 111 meq/L — AB (ref 98–109)
CO2: 26 mEq/L (ref 22–29)
CREATININE: 1.3 mg/dL (ref 0.7–1.3)
Glucose: 110 mg/dl (ref 70–140)
Potassium: 4.5 mEq/L (ref 3.5–5.1)
Sodium: 144 mEq/L (ref 136–145)
Total Bilirubin: 0.41 mg/dL (ref 0.20–1.20)
Total Protein: 6.3 g/dL — ABNORMAL LOW (ref 6.4–8.3)

## 2013-02-25 LAB — LACTATE DEHYDROGENASE (CC13): LDH: 297 U/L — ABNORMAL HIGH (ref 125–245)

## 2013-02-25 LAB — PREPARE RBC (CROSSMATCH)

## 2013-02-25 MED ORDER — INTEGRA PLUS PO CAPS: 1.0000 | ORAL_CAPSULE | Freq: Every day | ORAL | Status: DC

## 2013-02-25 NOTE — Progress Notes (Addendum)
[place] Telephone:([phone]   Fax:(336) [place] NOTE  Thomas Shin, MD [address] [place] [address]  DIAGNOSIS: Essential thrombocythemia.   PRIOR THERAPY:  1) Hydroxyurea 1000 mg alternating with 1500 mg by mouth every other day.  2) anagrelide 2.5 mg by mouth daily discontinued recently by Dr. Royce Cherry at [facility].   CURRENT THERAPY:  1) anagrelide 2.5 mg by mouth twice a day. 2) hydroxyurea 1000 mg by mouth daily - recent change per Dr. Royce Cherry  2) Integra plus 1 capsule by mouth daily for iron deficiency.  3) aspirin 81 mg by mouth daily.    INTERVAL HISTORY: Thomas Cherry 68 y.o. male returns to the clinic today for follow up visit. The patient is feeling fine today except for mild fatigue. He states that she saw Dr. Thayer Cherry last week and his Hydrea  dose was decreased to 1000 mg daily secondary to problems that he was having with his appetite. He just was not able to eat very much when he was taking 500 mg of Hydrea in the morning and 1000 mg in the evening. Since being on 1000 mg daily his appetite is improved and he is been able to gain a little weight. Denies any issues with bleeding or bruising. He requests a refill for his Integra plus. He denied having any significant chest pain, or shortness of breath with exertion. He denied having any bleeding issues. He is currently on treatment with anagrelide 2.5 mg by mouth twice a day in addition to the hydroxyurea.  He is tolerating his treatment fairly well with no significant adverse effects. The patient has repeat CBC and comprehensive metabolic panel performed earlier today and he is here for evaluation and discussion of his lab results.  MEDICAL HISTORY: Past Medical History  Diagnosis Date  . GERD (gastroesophageal reflux disease)   . Allergy     heyfever  . Hypertension   . Palpitation   . Elevated platelet count     on hydroxyurea   . Heart murmur     ALLERGIES:  has No Known Allergies.  MEDICATIONS:  Current Outpatient Prescriptions  Medication Sig Dispense Refill  . anagrelide (AGRYLIN) 0.5 MG capsule TAKE 5 CAPSULES EVERY MORNING AND 5 EVERY EVENING.  300 capsule  1  . aspirin 81 MG tablet Take 81 mg by mouth daily.        Marland Kitchen diltiazem (CARDIZEM CD) 180 MG 24 hr capsule TAKE (1) CAPSULE DAILY BY MOUTH  90 capsule  3  . esomeprazole (NEXIUM) 40 MG capsule TAKE ONE CAPSULE BY MOUTH EVERY OTHER DAY BEFORE BREAKFAST      . FeFum-FePoly-FA-B Cmp-C-Biot (INTEGRA PLUS) CAPS Take 1 capsule by mouth daily.  30 capsule  3  . folic acid (FOLVITE) 1 MG tablet Take 1 mg by mouth daily.      . hydroxyurea (HYDREA) 500 MG capsule 1 tablet in the morning and 2 at night      . losartan-hydrochlorothiazide (HYZAAR) 50-12.5 MG per tablet TAKE 1/2 TABLET ONCE DAILY  45 tablet  2   No current facility-administered medications for this visit.    SURGICAL HISTORY:  Past Surgical History  Procedure Laterality Date  . Hernia repair      REVIEW OF SYSTEMS:  Constitutional: positive for fatigue Eyes: negative Ears, nose, mouth, throat, and face: negative Respiratory: positive for dyspnea on exertion Cardiovascular: negative Gastrointestinal: negative Genitourinary:negative Integument/breast:  negative Hematologic/lymphatic: negative Musculoskeletal:negative Neurological: negative Behavioral/Psych: negative Endocrine: negative Allergic/Immunologic: negative   PHYSICAL EXAMINATION: General appearance: alert, cooperative, fatigued and no distress Head: Normocephalic, without obvious abnormality, atraumatic Neck: no adenopathy, no JVD, supple, symmetrical, trachea midline and thyroid not enlarged, symmetric, no tenderness/mass/nodules Lymph nodes: Cervical, supraclavicular, and axillary nodes normal. Resp: clear to auscultation bilaterally Cardio: regular rate and rhythm, S1, S2 normal, no murmur, click, rub or gallop GI:  soft, non-tender; bowel sounds normal; no masses,  no organomegaly Extremities: extremities normal, atraumatic, no cyanosis or edema Neurologic: Alert and oriented X 3, normal strength and tone. Normal symmetric reflexes. Normal coordination and gait  ECOG PERFORMANCE STATUS: 1 - Symptomatic but completely ambulatory  Blood pressure 148/60, pulse 94, temperature 97.5 F (36.4 C), temperature source Oral, resp. rate 18, height 5\' 9"  (1.753 m), weight 149 lb 9.6 oz (67.858 kg).  LABORATORY DATA: Lab Results  Component Value Date   WBC 3.8* 02/25/2013   HGB 7.1* 02/25/2013   HCT 20.9* 02/25/2013   MCV 105.9* 02/25/2013   PLT 592* 02/25/2013      Chemistry      Component Value Date/Time   NA 144 02/25/2013 1320   NA 145 04/15/2012 1142   K 4.5 02/25/2013 1320   K 4.8 04/15/2012 1142   CL 110* 07/29/2012 1132   CL 110 04/15/2012 1142   CO2 26 02/25/2013 1320   CO2 27 04/15/2012 1142   BUN 30.3* 02/25/2013 1320   BUN 27* 04/15/2012 1142   CREATININE 1.3 02/25/2013 1320   CREATININE 1.03 04/15/2012 1142      Component Value Date/Time   CALCIUM 8.6 02/25/2013 1320   CALCIUM 8.6 04/15/2012 1142   ALKPHOS 55 02/25/2013 1320   ALKPHOS 57 04/15/2012 1142   AST 23 02/25/2013 1320   AST 28 04/15/2012 1142   ALT 13 02/25/2013 1320   ALT <5 04/15/2012 1142   BILITOT 0.41 02/25/2013 1320   BILITOT 0.3 04/15/2012 1142       RADIOGRAPHIC STUDIES: No results found.  ASSESSMENT AND PLAN: The patient is a very pleasant 68 years old white male with essential thrombocythemia currently on treatment with anagrelide as well as hydroxyurea and tolerating his treatment fairly well. He is dose of hydroxyurea has recently been decreased by Dr. Royce Cherry and Thomas Cherry to 1000 mg daily and he is tolerating this better as it relates to his ability to eat. Patient was discussed with an also seen by Dr. Julien Cherry. He is anemic today with a hemoglobin of 7.1 and we will arrange to transfuse him a total of 2 units of packed red blood cells  to address this level of anemia. A refill for his Integra plus was sent to his pharmacy of record via E. scribed. He'll continue with labs every 2 weeks and followup with Dr. Julien Cherry in one month for another symptom management visit. His platelets count has increased on the recent blood work. We will continue to monitor this closely  He was advised to call me immediately if he has any concerning symptoms in the interval.  The patient voices understanding of current disease status and treatment options and is in agreement with the current care plan.  All questions were answered. The patient knows to call the clinic with any problems, questions or concerns. We can certainly see the patient much sooner if necessary.  Carlton Adam PA-C  ADDENDUM: Hematology/Oncology Attending: I had the face to face encounter with the patient. I recommended his care plan.  This is a very pleasant 68 years old white male with history of essential thrombocythemia currently on treatment with hydroxyurea and anagrelide. His dose of hydroxyurea was reduced to 1000 mg by mouth daily by Dr. Royce Cherry at [facility]. He continues on anagrelide 2.5 mg by mouth twice a day. He is doing fine except for mild fatigue. His hemoglobin and hematocrit are low today. I will arrange for the patient to receive 2 units of PRBCs transfusion. He would also continue on Integra plus 1 capsule by mouth daily. We will continue to monitor the patient closely with repeat blood work in 2 weeks. He would come back for follow up visit in one month's for reevaluation. He was advised to call immediately if he has any concerning symptoms in the interval.  Disclaimer: This note was dictated with voice recognition software. Similar sounding words can inadvertently be transcribed and may not be corrected upon review. Eilleen Kempf., MD 02/28/2013

## 2013-02-25 NOTE — Telephone Encounter (Signed)
gv adn printed appt sched na [facility] for pt for Jan and Feb

## 2013-02-26 ENCOUNTER — Ambulatory Visit (HOSPITAL_BASED_OUTPATIENT_CLINIC_OR_DEPARTMENT_OTHER): Payer: Medicare Other

## 2013-02-26 ENCOUNTER — Other Ambulatory Visit: Payer: Self-pay | Admitting: *Deleted

## 2013-02-26 VITALS — BP 147/68 | HR 81 | Temp 98.2°F | Resp 16

## 2013-02-26 DIAGNOSIS — D649 Anemia, unspecified: Secondary | ICD-10-CM

## 2013-02-26 MED ORDER — DIPHENHYDRAMINE HCL 25 MG PO CAPS
ORAL_CAPSULE | ORAL | Status: AC
  Filled 2013-02-26: qty 1

## 2013-02-26 MED ORDER — SODIUM CHLORIDE 0.9 % IV SOLN
250.0000 mL | Freq: Once | INTRAVENOUS | Status: AC
  Administered 2013-02-26: 250 mL via INTRAVENOUS

## 2013-02-26 MED ORDER — ACETAMINOPHEN 325 MG PO TABS
650.0000 mg | ORAL_TABLET | Freq: Once | ORAL | Status: AC
  Administered 2013-02-26: 650 mg via ORAL

## 2013-02-26 MED ORDER — ACETAMINOPHEN 325 MG PO TABS
ORAL_TABLET | ORAL | Status: AC
  Filled 2013-02-26: qty 2

## 2013-02-26 MED ORDER — DIPHENHYDRAMINE HCL 25 MG PO CAPS
25.0000 mg | ORAL_CAPSULE | Freq: Once | ORAL | Status: AC
  Administered 2013-02-26: 25 mg via ORAL

## 2013-02-26 NOTE — Patient Instructions (Signed)
Blood Transfusion Information WHAT IS A BLOOD TRANSFUSION? A transfusion is the replacement of blood or some of its parts. Blood is made up of multiple cells which provide different functions.  Red blood cells carry oxygen and are used for blood loss replacement.  White blood cells fight against infection.  Platelets control bleeding.  Plasma helps clot blood.  Other blood products are available for specialized needs, such as hemophilia or other clotting disorders. BEFORE THE TRANSFUSION  Who gives blood for transfusions?   You may be able to donate blood to be used at a later date on yourself (autologous donation).  Relatives can be asked to donate blood. This is generally not any safer than if you have received blood from a stranger. The same precautions are taken to ensure safety when a relative's blood is donated.  Healthy volunteers who are fully evaluated to make sure their blood is safe. This is blood bank blood. Transfusion therapy is the safest it has ever been in the practice of medicine. Before blood is taken from a donor, a complete history is taken to make sure that person has no history of diseases nor engages in risky social behavior (examples are intravenous drug use or sexual activity with multiple partners). The donor's travel history is screened to minimize risk of transmitting infections, such as malaria. The donated blood is tested for signs of infectious diseases, such as HIV and hepatitis. The blood is then tested to be sure it is compatible with you in order to minimize the chance of a transfusion reaction. If you or a relative donates blood, this is often done in anticipation of surgery and is not appropriate for emergency situations. It takes many days to process the donated blood. RISKS AND COMPLICATIONS Although transfusion therapy is very safe and saves many lives, the main dangers of transfusion include:   Getting an infectious disease.  Developing a  transfusion reaction. This is an allergic reaction to something in the blood you were given. Every precaution is taken to prevent this. The decision to have a blood transfusion has been considered carefully by your caregiver before blood is given. Blood is not given unless the benefits outweigh the risks. AFTER THE TRANSFUSION  Right after receiving a blood transfusion, you will usually feel much better and more energetic. This is especially true if your red blood cells have gotten low (anemic). The transfusion raises the level of the red blood cells which carry oxygen, and this usually causes an energy increase.  The nurse administering the transfusion will monitor you carefully for complications. HOME CARE INSTRUCTIONS  No special instructions are needed after a transfusion. You may find your energy is better. Speak with your caregiver about any limitations on activity for underlying diseases you may have. SEEK MEDICAL CARE IF:   Your condition is not improving after your transfusion.  You develop redness or irritation at the intravenous (IV) site. SEEK IMMEDIATE MEDICAL CARE IF:  Any of the following symptoms occur over the next 12 hours:  Shaking chills.  You have a temperature by mouth above 102 F (38.9 C), not controlled by medicine.  Chest, back, or muscle pain.  People around you feel you are not acting correctly or are confused.  Shortness of breath or difficulty breathing.  Dizziness and fainting.  You get a rash or develop hives.  You have a decrease in urine output.  Your urine turns a dark color or changes to pink, red, or brown. Any of the following   symptoms occur over the next 10 days:  You have a temperature by mouth above 102 F (38.9 C), not controlled by medicine.  Shortness of breath.  Weakness after normal activity.  The white part of the eye turns yellow (jaundice).  You have a decrease in the amount of urine or are urinating less often.  Your  urine turns a dark color or changes to pink, red, or brown. Document Released: 01/27/2000 Document Revised: 04/23/2011 Document Reviewed: 09/15/2007 [employer] Patient Information 2014 [employer].  

## 2013-02-27 NOTE — Patient Instructions (Signed)
Continue taking your hydroxyurea at 1000 mg by mouth daily. Continue taking the anegrilide and the Integra plus at the current doses Continue labs every 2 weeks Follow up in 2 weeks

## 2013-03-02 ENCOUNTER — Other Ambulatory Visit: Payer: Self-pay | Admitting: Internal Medicine

## 2013-03-02 LAB — TYPE AND SCREEN
ABO/RH(D): O POS
Antibody Screen: NEGATIVE
Unit division: 0
Unit division: 0

## 2013-03-11 ENCOUNTER — Other Ambulatory Visit (HOSPITAL_BASED_OUTPATIENT_CLINIC_OR_DEPARTMENT_OTHER): Payer: Medicare Other

## 2013-03-11 DIAGNOSIS — D473 Essential (hemorrhagic) thrombocythemia: Secondary | ICD-10-CM

## 2013-03-11 LAB — CBC WITH DIFFERENTIAL/PLATELET
BASO%: 0.4 % (ref 0.0–2.0)
BASOS ABS: 0 10*3/uL (ref 0.0–0.1)
EOS ABS: 0 10*3/uL (ref 0.0–0.5)
EOS%: 0.7 % (ref 0.0–7.0)
HCT: 29.3 % — ABNORMAL LOW (ref 38.4–49.9)
HEMOGLOBIN: 9.8 g/dL — AB (ref 13.0–17.1)
LYMPH#: 1.2 10*3/uL (ref 0.9–3.3)
LYMPH%: 22.5 % (ref 14.0–49.0)
MCH: 34.4 pg — ABNORMAL HIGH (ref 27.2–33.4)
MCHC: 33.4 g/dL (ref 32.0–36.0)
MCV: 103.1 fL — ABNORMAL HIGH (ref 79.3–98.0)
MONO#: 0.5 10*3/uL (ref 0.1–0.9)
MONO%: 8.6 % (ref 0.0–14.0)
NEUT%: 67.8 % (ref 39.0–75.0)
NEUTROS ABS: 3.7 10*3/uL (ref 1.5–6.5)
PLATELETS: 711 10*3/uL — AB (ref 140–400)
RBC: 2.84 10*6/uL — ABNORMAL LOW (ref 4.20–5.82)
RDW: 25.6 % — ABNORMAL HIGH (ref 11.0–14.6)
WBC: 5.5 10*3/uL (ref 4.0–10.3)

## 2013-03-11 LAB — COMPREHENSIVE METABOLIC PANEL (CC13)
ALT: 16 U/L (ref 0–55)
ANION GAP: 7 meq/L (ref 3–11)
AST: 22 U/L (ref 5–34)
Albumin: 3.8 g/dL (ref 3.5–5.0)
Alkaline Phosphatase: 56 U/L (ref 40–150)
BILIRUBIN TOTAL: 0.37 mg/dL (ref 0.20–1.20)
BUN: 35.1 mg/dL — ABNORMAL HIGH (ref 7.0–26.0)
CHLORIDE: 110 meq/L — AB (ref 98–109)
CO2: 28 meq/L (ref 22–29)
CREATININE: 1.2 mg/dL (ref 0.7–1.3)
Calcium: 9.2 mg/dL (ref 8.4–10.4)
GLUCOSE: 87 mg/dL (ref 70–140)
Potassium: 4.3 mEq/L (ref 3.5–5.1)
Sodium: 145 mEq/L (ref 136–145)
Total Protein: 6.6 g/dL (ref 6.4–8.3)

## 2013-03-25 ENCOUNTER — Ambulatory Visit (HOSPITAL_BASED_OUTPATIENT_CLINIC_OR_DEPARTMENT_OTHER): Payer: Medicare Other | Admitting: Physician Assistant

## 2013-03-25 ENCOUNTER — Encounter: Payer: Self-pay | Admitting: Physician Assistant

## 2013-03-25 ENCOUNTER — Other Ambulatory Visit (HOSPITAL_BASED_OUTPATIENT_CLINIC_OR_DEPARTMENT_OTHER): Payer: Medicare Other

## 2013-03-25 VITALS — BP 189/77 | HR 77 | Temp 97.9°F | Resp 18 | Ht 69.0 in | Wt 143.9 lb

## 2013-03-25 DIAGNOSIS — I10 Essential (primary) hypertension: Secondary | ICD-10-CM

## 2013-03-25 DIAGNOSIS — D473 Essential (hemorrhagic) thrombocythemia: Secondary | ICD-10-CM

## 2013-03-25 LAB — COMPREHENSIVE METABOLIC PANEL (CC13)
ALBUMIN: 3.8 g/dL (ref 3.5–5.0)
ALK PHOS: 57 U/L (ref 40–150)
ALT: 16 U/L (ref 0–55)
AST: 22 U/L (ref 5–34)
Anion Gap: 7 mEq/L (ref 3–11)
BUN: 37.3 mg/dL — ABNORMAL HIGH (ref 7.0–26.0)
CO2: 27 mEq/L (ref 22–29)
Calcium: 9.4 mg/dL (ref 8.4–10.4)
Chloride: 110 mEq/L — ABNORMAL HIGH (ref 98–109)
Creatinine: 1.3 mg/dL (ref 0.7–1.3)
Glucose: 97 mg/dl (ref 70–140)
POTASSIUM: 4.3 meq/L (ref 3.5–5.1)
Sodium: 144 mEq/L (ref 136–145)
Total Bilirubin: 0.35 mg/dL (ref 0.20–1.20)
Total Protein: 6.4 g/dL (ref 6.4–8.3)

## 2013-03-25 LAB — CBC WITH DIFFERENTIAL/PLATELET
BASO%: 0.4 % (ref 0.0–2.0)
BASOS ABS: 0 10*3/uL (ref 0.0–0.1)
EOS%: 0.9 % (ref 0.0–7.0)
Eosinophils Absolute: 0 10*3/uL (ref 0.0–0.5)
HCT: 26.9 % — ABNORMAL LOW (ref 38.4–49.9)
HEMOGLOBIN: 8.6 g/dL — AB (ref 13.0–17.1)
LYMPH#: 1.3 10*3/uL (ref 0.9–3.3)
LYMPH%: 27.8 % (ref 14.0–49.0)
MCH: 31.7 pg (ref 27.2–33.4)
MCHC: 32 g/dL (ref 32.0–36.0)
MCV: 99.3 fL — ABNORMAL HIGH (ref 79.3–98.0)
MONO#: 0.4 10*3/uL (ref 0.1–0.9)
MONO%: 8.8 % (ref 0.0–14.0)
NEUT#: 2.8 10*3/uL (ref 1.5–6.5)
NEUT%: 62.1 % (ref 39.0–75.0)
Platelets: 540 10*3/uL — ABNORMAL HIGH (ref 140–400)
RBC: 2.71 10*6/uL — ABNORMAL LOW (ref 4.20–5.82)
RDW: 25.2 % — ABNORMAL HIGH (ref 11.0–14.6)
WBC: 4.5 10*3/uL (ref 4.0–10.3)
nRBC: 1 % — ABNORMAL HIGH (ref 0–0)

## 2013-03-25 NOTE — Progress Notes (Addendum)
[facility] [phone]   Fax:([phone]) [place] NOTE  Renato Shin, MD [address] [place] [address]  DIAGNOSIS: Essential thrombocythemia.   PRIOR THERAPY:  1) Hydroxyurea 1000 mg alternating with 1500 mg by mouth every other day.  2) anagrelide 2.5 mg by mouth daily discontinued recently by Dr. Royce Macadamia at [facility]   CURRENT THERAPY:  1) anagrelide 2.5 mg by mouth twice a day. 2) hydroxyurea 1000 mg by mouth daily - recent change per Dr. Royce Macadamia  2) Integra plus 1 capsule by mouth daily for iron deficiency.  3) aspirin 81 mg by mouth daily.    INTERVAL HISTORY: Thomas Cherry 68 y.o. male returns to the clinic today for follow up visit. The patient is feeling fine today except for mild fatigue. He also is a bit anxious as he has a huge workload in the That he is in to me today. Your reports missing a few male secondary to his increased work schedule. He denied any night sweats. He occasionally has some episodes of constipation which are resolved with changes to his diet. Denied any fever, chills, nausea, vomiting, diarrhea hemoptysis or significant weight loss. He continues on his current doses of anagrelide, hydroxyurea, Integra plus an aspirin without difficulty. He did require a blood transfusion about one month ago and has felt better after receiving the blood transfusion. Denies any issues with bleeding or bruising. He denied having any significant chest pain, or shortness of breath with exertion.  He is tolerating his treatment fairly well with no significant adverse effects. The patient has repeat CBC and comprehensive metabolic panel performed earlier today and he is here for evaluation and discussion of his lab results.  MEDICAL HISTORY: Past Medical History  Diagnosis Date  . GERD (gastroesophageal reflux disease)   . Allergy     heyfever  . Hypertension   . Palpitation   . Elevated platelet  count     on hydroxyurea  . Heart murmur     ALLERGIES:  has No Known Allergies.  MEDICATIONS:  Current Outpatient Prescriptions  Medication Sig Dispense Refill  . anagrelide (AGRYLIN) 0.5 MG capsule TAKE 5 CAPSULES EVERY MORNING AND 5 EVERY EVENING.  900 capsule  0  . aspirin 81 MG tablet Take 81 mg by mouth daily.        Marland Kitchen diltiazem (CARDIZEM CD) 180 MG 24 hr capsule TAKE (1) CAPSULE DAILY BY MOUTH  90 capsule  3  . esomeprazole (NEXIUM) 40 MG capsule TAKE ONE CAPSULE BY MOUTH EVERY OTHER DAY BEFORE BREAKFAST      . FeFum-FePoly-FA-B Cmp-C-Biot (INTEGRA PLUS) CAPS Take 1 capsule by mouth daily.  30 capsule  3  . folic acid (FOLVITE) 1 MG tablet Take 1 mg by mouth daily.      . hydroxyurea (HYDREA) 500 MG capsule Take 1 capsule (500 mg total) by mouth 2 (two) times daily.  180 capsule  1  . losartan-hydrochlorothiazide (HYZAAR) 50-12.5 MG per tablet TAKE 1/2 TABLET ONCE DAILY  45 tablet  2   No current facility-administered medications for this visit.    SURGICAL HISTORY:  Past Surgical History  Procedure Laterality Date  . Hernia repair      REVIEW OF SYSTEMS:  Constitutional: negative Eyes: negative Ears, nose, mouth, throat, and face: negative Respiratory: negative Cardiovascular: negative Gastrointestinal: positive for constipation Genitourinary:negative Integument/breast: negative Hematologic/lymphatic: negative Musculoskeletal:negative Neurological: negative Behavioral/Psych: positive for Patient is an  and it anxious and stressed today do to his increased workload and deadlines Endocrine: negative Allergic/Immunologic: negative   PHYSICAL EXAMINATION: General appearance: alert, cooperative, fatigued and no distress Head: Normocephalic, without obvious abnormality, atraumatic Neck: no adenopathy, no JVD, supple, symmetrical, trachea midline and thyroid not enlarged, symmetric, no tenderness/mass/nodules Lymph nodes: Cervical, supraclavicular, and axillary nodes  normal. Resp: clear to auscultation bilaterally Cardio: regular rate and rhythm, S1, S2 normal, no murmur, click, rub or gallop GI: soft, non-tender; bowel sounds normal; no masses,  no organomegaly Extremities: extremities normal, atraumatic, no cyanosis or edema Neurologic: Alert and oriented X 3, normal strength and tone. Normal symmetric reflexes. Normal coordination and gait  ECOG PERFORMANCE STATUS: 1 - Symptomatic but completely ambulatory  Blood pressure 189/77, pulse 77, temperature 97.9 F (36.6 C), temperature source Oral, resp. rate 18, height 5\' 9"  (1.753 m), weight 143 lb 14.4 oz (65.273 kg), SpO2 100.00%.  LABORATORY DATA: Lab Results  Component Value Date   WBC 4.5 03/25/2013   HGB 8.6* 03/25/2013   HCT 26.9* 03/25/2013   MCV 99.3* 03/25/2013   PLT 540* 03/25/2013      Chemistry      Component Value Date/Time   NA 144 03/25/2013 1053   NA 145 04/15/2012 1142   K 4.3 03/25/2013 1053   K 4.8 04/15/2012 1142   CL 110* 07/29/2012 1132   CL 110 04/15/2012 1142   CO2 27 03/25/2013 1053   CO2 27 04/15/2012 1142   BUN 37.3* 03/25/2013 1053   BUN 27* 04/15/2012 1142   CREATININE 1.3 03/25/2013 1053   CREATININE 1.03 04/15/2012 1142      Component Value Date/Time   CALCIUM 9.4 03/25/2013 1053   CALCIUM 8.6 04/15/2012 1142   ALKPHOS 57 03/25/2013 1053   ALKPHOS 57 04/15/2012 1142   AST 22 03/25/2013 1053   AST 28 04/15/2012 1142   ALT 16 03/25/2013 1053   ALT <5 04/15/2012 1142   BILITOT 0.35 03/25/2013 1053   BILITOT 0.3 04/15/2012 1142       RADIOGRAPHIC STUDIES: No results found.  ASSESSMENT AND PLAN: The patient is a very pleasant 68 years old white male with essential thrombocythemia currently on treatment with anagrelide as well as hydroxyurea and tolerating his treatment fairly well.  Patient was discussed with and also seen by Dr. Julien Nordmann. He'll continue on his anagrelide, hydroxyurea, Integra plus an aspirin at the current doses. His blood pressure is elevated today and the patient  feels it is directly related to his increased stress level and work deadlines. He has taken his antihypertensive medications as prescribed and will keep a close watch on his blood pressure readings. He'll continue with labs every 2 weeks and followup with Dr. Julien Nordmann in one month for another symptom management visit. His platelets count has decreased to 540,000 compared to 711,00 2 weeks ago. We will continue to monitor this closely  He was advised to call me immediately if he has any concerning symptoms in the interval.  The patient voices understanding of current disease status and treatment options and is in agreement with the current care plan.  All questions were answered. The patient knows to call the clinic with any problems, questions or concerns. We can certainly see the patient much sooner if necessary.  Carlton Adam PA-C  ADDENDUM:  Hematology/Oncology Attending:  I had a face to face encounter with the patient. I recommended his care plan. This is a very pleasant 68 years old white male with essential thrombocythemia  currently on treatment with anagrelide 2.5 mg by mouth twice a day in addition to hydroxyurea 1000 mg by mouth daily. He is tolerating his treatment fairly well and his platelets count are still high but reasonably controlled with the current treatment. I recommended for the patient to continue with the same regimen of anagrelide and hydroxyurea. We will continue to monitor his CBC every 2 weeks for now and the patient would come back for followup visit in one month. He was advised to call immediately if he has any concerning symptoms in the interval.  Disclaimer: This note was dictated with voice recognition software. Similar sounding words can inadvertently be transcribed and may not be corrected upon review. Eilleen Kempf., MD 03/29/2013

## 2013-03-26 ENCOUNTER — Encounter: Payer: Self-pay | Admitting: Endocrinology

## 2013-03-26 ENCOUNTER — Ambulatory Visit (INDEPENDENT_AMBULATORY_CARE_PROVIDER_SITE_OTHER): Payer: Medicare Other | Admitting: Endocrinology

## 2013-03-26 VITALS — BP 128/80 | HR 78 | Temp 97.9°F | Ht 69.0 in | Wt 144.0 lb

## 2013-03-26 DIAGNOSIS — Z125 Encounter for screening for malignant neoplasm of prostate: Secondary | ICD-10-CM

## 2013-03-26 DIAGNOSIS — Z0000 Encounter for general adult medical examination without abnormal findings: Secondary | ICD-10-CM

## 2013-03-26 DIAGNOSIS — E7800 Pure hypercholesterolemia, unspecified: Secondary | ICD-10-CM

## 2013-03-26 DIAGNOSIS — I10 Essential (primary) hypertension: Secondary | ICD-10-CM

## 2013-03-26 DIAGNOSIS — Z79899 Other long term (current) drug therapy: Secondary | ICD-10-CM

## 2013-03-26 DIAGNOSIS — R9431 Abnormal electrocardiogram [ECG] [EKG]: Secondary | ICD-10-CM

## 2013-03-26 NOTE — Patient Instructions (Signed)
Continue taking your anagrelide, hydroxyurea, Integra plus and aspirin at the current doses Keep your every two-week lab appointments as scheduled Followup in one month Monitor your blood pressure readings closely and followup with your primary care physician if they remain elevated

## 2013-03-26 NOTE — Progress Notes (Signed)
Subjective:    Patient ID: Thomas Cherry, male    DOB: [birth year], 68 y.o.   MRN: [id number]  HPI The state of at least three ongoing medical problems is addressed today, with interval history of each noted here: HTN: denies sob Dyslipidemia: denies weight change Abnormal ecg: denies chest pain Past Medical History  Diagnosis Date  . GERD (gastroesophageal reflux disease)   . Allergy     heyfever  . Hypertension   . Palpitation   . Elevated platelet count     on hydroxyurea  . Heart murmur     Past Surgical History  Procedure Laterality Date  . Hernia repair      History   Social History  . Marital Status: Married    Spouse Name: N/A    Number of Children: 3  . Years of Education: N/A   Occupational History  . court reporter    Social History Main Topics  . Smoking status: Never Smoker   . Smokeless tobacco: Never Used  . Alcohol Use: No  . Drug Use: No  . Sexual Activity: Not on file   Other Topics Concern  . Not on file   Social History Narrative  . No narrative on file    Current Outpatient Prescriptions on File Prior to Visit  Medication Sig Dispense Refill  . anagrelide (AGRYLIN) 0.5 MG capsule TAKE 5 CAPSULES EVERY MORNING AND 5 EVERY EVENING.  900 capsule  0  . aspirin 81 MG tablet Take 81 mg by mouth daily.        Marland Kitchen diltiazem (CARDIZEM CD) 180 MG 24 hr capsule TAKE (1) CAPSULE DAILY BY MOUTH  90 capsule  3  . esomeprazole (NEXIUM) 40 MG capsule TAKE ONE CAPSULE BY MOUTH EVERY OTHER DAY BEFORE BREAKFAST      . FeFum-FePoly-FA-B Cmp-C-Biot (INTEGRA PLUS) CAPS Take 1 capsule by mouth daily.  30 capsule  3  . folic acid (FOLVITE) 1 MG tablet Take 1 mg by mouth daily.      . hydroxyurea (HYDREA) 500 MG capsule Take 1 capsule (500 mg total) by mouth 2 (two) times daily.  180 capsule  1  . losartan-hydrochlorothiazide (HYZAAR) 50-12.5 MG per tablet TAKE 1/2 TABLET ONCE DAILY  45 tablet  2   No current facility-administered medications on file prior  to visit.    No Known Allergies  Family History  Problem Relation Age of Onset  . Ovarian cancer    . Uterine cancer    . Colitis      crohns  . Cancer Mother   . Cancer Father   . Transient ischemic attack Sister     Aortic valve replacement  . Colon cancer Neg Hx     BP 128/80  Pulse 78  Temp(Src) 97.9 F (36.6 C) (Oral)  Ht 5\' 9"  (1.753 m)  Wt 144 lb (65.318 kg)  BMI 21.26 kg/m2  SpO2 98%   Review of Systems  Constitutional: Negative for fever.  HENT: Negative for hearing loss.   Eyes: Negative for visual disturbance.  Gastrointestinal: Negative for anal bleeding.  Endocrine: Negative for cold intolerance.  Genitourinary: Negative for hematuria.  Musculoskeletal: Negative for back pain.  Skin: Negative for rash.  Allergic/Immunologic: Negative for environmental allergies.  Neurological: Negative for syncope and numbness.  Hematological: Bruises/bleeds easily.  Psychiatric/Behavioral: Negative for dysphoric mood.      Objective:   Physical Exam VS: see vs page GEN: no distress HEAD: head: no deformity eyes: no periorbital swelling,  no proptosis external nose and ears are normal NECK: supple, thyroid is not enlarged CHEST WALL: no deformity LUNGS: clear to auscultation BREASTS:  No gynecomastia CV: reg rate and rhythm, no murmur MUSCULOSKELETAL: muscle bulk and strength are grossly normal.  no obvious joint swelling.  gait is normal and steady EXTEMITIES: no deformity.  no ulcer on the feet.  feet are of normal color and temp.  no edema.  There is bilateral onychomycosis.   PULSES: dorsalis pedis intact bilat.  no carotid bruit NEURO:  cn 2-12 grossly intact.   readily moves all 4's.  sensation is intact to touch on the feet SKIN:  Normal texture and temperature.  No rash or suspicious lesion is visible.   NODES:  None palpable at the neck PSYCH: alert, well-oriented.  Does not appear anxious nor depressed.   i reviewed electrocardiogram.       Assessment & Plan:  HTN: well-controlled Dyslipidemia: he has not required medication Abnormal ecg, unchanged    Subjective:   Patient here for Medicare annual wellness visit and management of other chronic and acute problems.     Risk factors: advanced age    3 of Physicians Providing Medical Care to Patient:  See "snapshot"   Activities of Daily Living: In your present state of health, do you have any difficulty performing the following activities?:  Preparing food and eating?: No  Bathing yourself: No  Getting dressed: No  Using the toilet:No  Moving around from place to place: No  In the past year have you fallen or had a near fall?:No    Home Safety: Has smoke detector and wears seat belts. No firearms. No excess sun exposure.  Diet and Exercise  Current exercise habits: pt says good Dietary issues discussed: pt reports a healthy diet   Depression Screen  Q1: Over the past two weeks, have you felt down, depressed or hopeless? no  Q2: Over the past two weeks, have you felt little interest or pleasure in doing things? no   The following portions of the patient's history were reviewed and updated as appropriate: allergies, current medications, past family history, past medical history, past social history, past surgical history and problem list.  Past Medical History  Diagnosis Date  . GERD (gastroesophageal reflux disease)   . Allergy     heyfever  . Hypertension   . Palpitation   . Elevated platelet count     on hydroxyurea  . Heart murmur     Past Surgical History  Procedure Laterality Date  . Hernia repair      History   Social History  . Marital Status: Married    Spouse Name: N/A    Number of Children: 3  . Years of Education: N/A   Occupational History  . court reporter    Social History Main Topics  . Smoking status: Never Smoker   . Smokeless tobacco: Never Used  . Alcohol Use: No  . Drug Use: No  . Sexual Activity: Not on file    Other Topics Concern  . Not on file   Social History Narrative  . No narrative on file    Current Outpatient Prescriptions on File Prior to Visit  Medication Sig Dispense Refill  . anagrelide (AGRYLIN) 0.5 MG capsule TAKE 5 CAPSULES EVERY MORNING AND 5 EVERY EVENING.  900 capsule  0  . aspirin 81 MG tablet Take 81 mg by mouth daily.        Marland Kitchen diltiazem (CARDIZEM CD) 180  MG 24 hr capsule TAKE (1) CAPSULE DAILY BY MOUTH  90 capsule  3  . esomeprazole (NEXIUM) 40 MG capsule TAKE ONE CAPSULE BY MOUTH EVERY OTHER DAY BEFORE BREAKFAST      . FeFum-FePoly-FA-B Cmp-C-Biot (INTEGRA PLUS) CAPS Take 1 capsule by mouth daily.  30 capsule  3  . folic acid (FOLVITE) 1 MG tablet Take 1 mg by mouth daily.      . hydroxyurea (HYDREA) 500 MG capsule Take 1 capsule (500 mg total) by mouth 2 (two) times daily.  180 capsule  1  . losartan-hydrochlorothiazide (HYZAAR) 50-12.5 MG per tablet TAKE 1/2 TABLET ONCE DAILY  45 tablet  2   No current facility-administered medications on file prior to visit.    No Known Allergies  Family History  Problem Relation Age of Onset  . Ovarian cancer    . Uterine cancer    . Colitis      crohns  . Cancer Mother   . Cancer Father   . Transient ischemic attack Sister     Aortic valve replacement  . Colon cancer Neg Hx     BP 128/80  Pulse 78  Temp(Src) 97.9 F (36.6 C) (Oral)  Ht 5\' 9"  (1.753 m)  Wt 144 lb (65.318 kg)  BMI 21.26 kg/m2  SpO2 98%  Review of Systems  Denies hearing loss, and visual loss Objective:   Vision:  Sees opthalmologist Hearing: grossly normal Body mass index:  See vs page Msk: pt easily and quickly performs "get-up-and-go" from a sitting position Cognitive Impairment Assessment: cognition, memory and judgment appear normal.  remembers 3/3 at 5 minutes.  excellent recall.  can easily read and write a sentence.  alert and oriented x 3.   Assessment:   Medicare wellness utd on preventive parameters    Plan:   During the  course of the visit the patient was educated and counseled about appropriate screening and preventive services including:       Fall prevention    Diabetes screening  Nutrition counseling   Vaccines / LABS       Pt insists he has had pneumovax PSA  Patient Instructions (the written plan) was given to the patient.   we discussed code status.  pt requests full code, but would not want to be started or maintained on artificial life-support measures if there was not a reasonable chance of recovery

## 2013-03-26 NOTE — Patient Instructions (Addendum)
good diet and exercise habits are good for your health.  you should see an eye doctor every year.   please consider these measures for your health:  minimize alcohol.  do not use tobacco products.  have a colonoscopy at least every 10 years from age 69.  keep firearms safely stored.  always use seat belts.  have working smoke alarms in your home.  see an eye doctor and dentist regularly.  never drive under the influence of alcohol or drugs (including prescription drugs).  those with fair skin should take precautions against the sun. Please do blood tests with your next tests at the cancer center. If they won't do them, please do here.   Please return in 1 year.

## 2013-04-08 ENCOUNTER — Other Ambulatory Visit (HOSPITAL_BASED_OUTPATIENT_CLINIC_OR_DEPARTMENT_OTHER): Payer: Medicare Other

## 2013-04-08 DIAGNOSIS — D75839 Thrombocytosis, unspecified: Secondary | ICD-10-CM

## 2013-04-08 DIAGNOSIS — D473 Essential (hemorrhagic) thrombocythemia: Secondary | ICD-10-CM

## 2013-04-08 LAB — CBC WITH DIFFERENTIAL/PLATELET
BASO%: 0.3 % (ref 0.0–2.0)
Basophils Absolute: 0 10*3/uL (ref 0.0–0.1)
EOS ABS: 0.1 10*3/uL (ref 0.0–0.5)
EOS%: 1.1 % (ref 0.0–7.0)
HCT: 24.8 % — ABNORMAL LOW (ref 38.4–49.9)
HGB: 8.2 g/dL — ABNORMAL LOW (ref 13.0–17.1)
LYMPH%: 20.4 % (ref 14.0–49.0)
MCH: 33.5 pg — AB (ref 27.2–33.4)
MCHC: 32.9 g/dL (ref 32.0–36.0)
MCV: 101.8 fL — AB (ref 79.3–98.0)
MONO#: 0.5 10*3/uL (ref 0.1–0.9)
MONO%: 11 % (ref 0.0–14.0)
NEUT%: 67.2 % (ref 39.0–75.0)
NEUTROS ABS: 3.4 10*3/uL (ref 1.5–6.5)
PLATELETS: 614 10*3/uL — AB (ref 140–400)
RBC: 2.43 10*6/uL — AB (ref 4.20–5.82)
RDW: 25.6 % — ABNORMAL HIGH (ref 11.0–14.6)
WBC: 5 10*3/uL (ref 4.0–10.3)
lymph#: 1 10*3/uL (ref 0.9–3.3)

## 2013-04-08 LAB — COMPREHENSIVE METABOLIC PANEL (CC13)
ALK PHOS: 58 U/L (ref 40–150)
ALT: 17 U/L (ref 0–55)
AST: 19 U/L (ref 5–34)
Albumin: 3.7 g/dL (ref 3.5–5.0)
Anion Gap: 9 mEq/L (ref 3–11)
BILIRUBIN TOTAL: 0.45 mg/dL (ref 0.20–1.20)
BUN: 36.8 mg/dL — AB (ref 7.0–26.0)
CO2: 26 mEq/L (ref 22–29)
Calcium: 9 mg/dL (ref 8.4–10.4)
Chloride: 110 mEq/L — ABNORMAL HIGH (ref 98–109)
Creatinine: 1.3 mg/dL (ref 0.7–1.3)
GLUCOSE: 96 mg/dL (ref 70–140)
Potassium: 4.3 mEq/L (ref 3.5–5.1)
SODIUM: 144 meq/L (ref 136–145)
TOTAL PROTEIN: 6.6 g/dL (ref 6.4–8.3)

## 2013-04-13 ENCOUNTER — Telehealth: Payer: Self-pay | Admitting: Internal Medicine

## 2013-04-13 ENCOUNTER — Telehealth: Payer: Self-pay | Admitting: Medical Oncology

## 2013-04-13 DIAGNOSIS — D473 Essential (hemorrhagic) thrombocythemia: Secondary | ICD-10-CM

## 2013-04-13 NOTE — Telephone Encounter (Signed)
onc tx request sent. 

## 2013-04-13 NOTE — Telephone Encounter (Signed)
lmonvm the pt of his lab appt on 04/15/2013@2 :15pm. S/w diane regarding the appt on 04/22/2013 that is also needed. No availability on that date. Per diane will let dr Julien Nordmann know and will decide when the pt comes in on 04/15/2013 for his lab appt.

## 2013-04-15 ENCOUNTER — Other Ambulatory Visit (HOSPITAL_BASED_OUTPATIENT_CLINIC_OR_DEPARTMENT_OTHER): Payer: Medicare Other

## 2013-04-15 ENCOUNTER — Other Ambulatory Visit: Payer: Self-pay | Admitting: Physician Assistant

## 2013-04-15 ENCOUNTER — Ambulatory Visit (HOSPITAL_BASED_OUTPATIENT_CLINIC_OR_DEPARTMENT_OTHER): Payer: Medicare Other

## 2013-04-15 ENCOUNTER — Ambulatory Visit (HOSPITAL_COMMUNITY)
Admission: RE | Admit: 2013-04-15 | Discharge: 2013-04-15 | Disposition: A | Discharge status: Home / Self Care | Payer: Medicare Other | Source: Ambulatory Visit | Attending: Internal Medicine | Admitting: Internal Medicine

## 2013-04-15 ENCOUNTER — Ambulatory Visit: Payer: Medicare Other

## 2013-04-15 ENCOUNTER — Encounter (HOSPITAL_COMMUNITY): Payer: Medicare Other

## 2013-04-15 VITALS — BP 164/75 | HR 71 | Temp 98.3°F | Resp 18

## 2013-04-15 DIAGNOSIS — D649 Anemia, unspecified: Secondary | ICD-10-CM

## 2013-04-15 DIAGNOSIS — D75839 Thrombocytosis, unspecified: Secondary | ICD-10-CM

## 2013-04-15 DIAGNOSIS — D473 Essential (hemorrhagic) thrombocythemia: Secondary | ICD-10-CM

## 2013-04-15 LAB — COMPREHENSIVE METABOLIC PANEL (CC13)
ALBUMIN: 3.3 g/dL — AB (ref 3.5–5.0)
ALT: 13 U/L (ref 0–55)
ANION GAP: 7 meq/L (ref 3–11)
AST: 20 U/L (ref 5–34)
Alkaline Phosphatase: 64 U/L (ref 40–150)
BUN: 35.8 mg/dL — ABNORMAL HIGH (ref 7.0–26.0)
CO2: 26 meq/L (ref 22–29)
Calcium: 8.8 mg/dL (ref 8.4–10.4)
Chloride: 111 mEq/L — ABNORMAL HIGH (ref 98–109)
Creatinine: 1.5 mg/dL — ABNORMAL HIGH (ref 0.7–1.3)
GLUCOSE: 122 mg/dL (ref 70–140)
POTASSIUM: 4 meq/L (ref 3.5–5.1)
Sodium: 145 mEq/L (ref 136–145)
Total Bilirubin: 0.3 mg/dL (ref 0.20–1.20)
Total Protein: 6 g/dL — ABNORMAL LOW (ref 6.4–8.3)

## 2013-04-15 LAB — CBC WITH DIFFERENTIAL/PLATELET
BASO%: 0.2 % (ref 0.0–2.0)
Basophils Absolute: 0 10*3/uL (ref 0.0–0.1)
EOS%: 0.8 % (ref 0.0–7.0)
Eosinophils Absolute: 0 10*3/uL (ref 0.0–0.5)
HCT: 20.7 % — ABNORMAL LOW (ref 38.4–49.9)
HGB: 6.6 g/dL — CL (ref 13.0–17.1)
LYMPH#: 1 10*3/uL (ref 0.9–3.3)
LYMPH%: 19.1 % (ref 14.0–49.0)
MCH: 31.7 pg (ref 27.2–33.4)
MCHC: 31.9 g/dL — AB (ref 32.0–36.0)
MCV: 99.5 fL — ABNORMAL HIGH (ref 79.3–98.0)
MONO#: 0.5 10*3/uL (ref 0.1–0.9)
MONO%: 9.7 % (ref 0.0–14.0)
NEUT#: 3.7 10*3/uL (ref 1.5–6.5)
NEUT%: 70.2 % (ref 39.0–75.0)
NRBC: 1 % — AB (ref 0–0)
Platelets: 529 10*3/uL — ABNORMAL HIGH (ref 140–400)
RBC: 2.08 10*6/uL — AB (ref 4.20–5.82)
RDW: 26.8 % — AB (ref 11.0–14.6)
WBC: 5.3 10*3/uL (ref 4.0–10.3)

## 2013-04-15 LAB — PREPARE RBC (CROSSMATCH)

## 2013-04-15 LAB — TECHNOLOGIST REVIEW

## 2013-04-15 MED ORDER — DIPHENHYDRAMINE HCL 25 MG PO CAPS
ORAL_CAPSULE | ORAL | Status: AC
  Filled 2013-04-15: qty 1

## 2013-04-15 MED ORDER — DIPHENHYDRAMINE HCL 25 MG PO CAPS
25.0000 mg | ORAL_CAPSULE | Freq: Once | ORAL | Status: AC
  Administered 2013-04-15: 25 mg via ORAL

## 2013-04-15 MED ORDER — SODIUM CHLORIDE 0.9 % IV SOLN
250.0000 mL | Freq: Once | INTRAVENOUS | Status: AC
  Administered 2013-04-15: 250 mL via INTRAVENOUS

## 2013-04-15 MED ORDER — SODIUM CHLORIDE 0.9 % IV SOLN: 250.0000 mL | Freq: Once | INTRAVENOUS | Status: DC

## 2013-04-15 MED ORDER — ACETAMINOPHEN 325 MG PO TABS
650.0000 mg | ORAL_TABLET | Freq: Once | ORAL | Status: AC
Start: 2013-04-15 — End: 2013-04-15
  Administered 2013-04-15: 650 mg via ORAL

## 2013-04-15 MED ORDER — ACETAMINOPHEN 325 MG PO TABS
ORAL_TABLET | ORAL | Status: AC
  Filled 2013-04-15: qty 2

## 2013-04-15 NOTE — Patient Instructions (Signed)
Blood Transfusion Information WHAT IS A BLOOD TRANSFUSION? A transfusion is the replacement of blood or some of its parts. Blood is made up of multiple cells which provide different functions.  Red blood cells carry oxygen and are used for blood loss replacement.  White blood cells fight against infection.  Platelets control bleeding.  Plasma helps clot blood.  Other blood products are available for specialized needs, such as hemophilia or other clotting disorders. BEFORE THE TRANSFUSION  Who gives blood for transfusions?   You may be able to donate blood to be used at a later date on yourself (autologous donation).  Relatives can be asked to donate blood. This is generally not any safer than if you have received blood from a stranger. The same precautions are taken to ensure safety when a relative's blood is donated.  Healthy volunteers who are fully evaluated to make sure their blood is safe. This is blood bank blood. Transfusion therapy is the safest it has ever been in the practice of medicine. Before blood is taken from a donor, a complete history is taken to make sure that person has no history of diseases nor engages in risky social behavior (examples are intravenous drug use or sexual activity with multiple partners). The donor's travel history is screened to minimize risk of transmitting infections, such as malaria. The donated blood is tested for signs of infectious diseases, such as HIV and hepatitis. The blood is then tested to be sure it is compatible with you in order to minimize the chance of a transfusion reaction. If you or a relative donates blood, this is often done in anticipation of surgery and is not appropriate for emergency situations. It takes many days to process the donated blood. RISKS AND COMPLICATIONS Although transfusion therapy is very safe and saves many lives, the main dangers of transfusion include:   Getting an infectious disease.  Developing a  transfusion reaction. This is an allergic reaction to something in the blood you were given. Every precaution is taken to prevent this. The decision to have a blood transfusion has been considered carefully by your caregiver before blood is given. Blood is not given unless the benefits outweigh the risks. AFTER THE TRANSFUSION  Right after receiving a blood transfusion, you will usually feel much better and more energetic. This is especially true if your red blood cells have gotten low (anemic). The transfusion raises the level of the red blood cells which carry oxygen, and this usually causes an energy increase.  The nurse administering the transfusion will monitor you carefully for complications. HOME CARE INSTRUCTIONS  No special instructions are needed after a transfusion. You may find your energy is better. Speak with your caregiver about any limitations on activity for underlying diseases you may have. SEEK MEDICAL CARE IF:   Your condition is not improving after your transfusion.  You develop redness or irritation at the intravenous (IV) site. SEEK IMMEDIATE MEDICAL CARE IF:  Any of the following symptoms occur over the next 12 hours:  Shaking chills.  You have a temperature by mouth above 102 F (38.9 C), not controlled by medicine.  Chest, back, or muscle pain.  People around you feel you are not acting correctly or are confused.  Shortness of breath or difficulty breathing.  Dizziness and fainting.  You get a rash or develop hives.  You have a decrease in urine output.  Your urine turns a dark color or changes to pink, red, or brown. Any of the following   symptoms occur over the next 10 days:  You have a temperature by mouth above 102 F (38.9 C), not controlled by medicine.  Shortness of breath.  Weakness after normal activity.  The white part of the eye turns yellow (jaundice).  You have a decrease in the amount of urine or are urinating less often.  Your  urine turns a dark color or changes to pink, red, or brown. Document Released: 01/27/2000 Document Revised: 04/23/2011 Document Reviewed: 09/15/2007 [employer] Patient Information 2014 [employer].  

## 2013-04-16 ENCOUNTER — Ambulatory Visit (HOSPITAL_BASED_OUTPATIENT_CLINIC_OR_DEPARTMENT_OTHER): Payer: Medicare Other

## 2013-04-16 ENCOUNTER — Other Ambulatory Visit: Payer: Self-pay | Admitting: *Deleted

## 2013-04-16 VITALS — BP 151/60 | HR 70 | Temp 98.3°F | Resp 17

## 2013-04-16 DIAGNOSIS — D649 Anemia, unspecified: Secondary | ICD-10-CM

## 2013-04-16 MED ORDER — DIPHENHYDRAMINE HCL 25 MG PO CAPS
25.0000 mg | ORAL_CAPSULE | Freq: Once | ORAL | Status: AC
  Administered 2013-04-16: 25 mg via ORAL

## 2013-04-16 MED ORDER — SODIUM CHLORIDE 0.9 % IV SOLN
250.0000 mL | Freq: Once | INTRAVENOUS | Status: AC
  Administered 2013-04-16: 250 mL via INTRAVENOUS

## 2013-04-16 MED ORDER — DIPHENHYDRAMINE HCL 25 MG PO CAPS
ORAL_CAPSULE | ORAL | Status: AC
  Filled 2013-04-16: qty 1

## 2013-04-16 MED ORDER — ACETAMINOPHEN 325 MG PO TABS
650.0000 mg | ORAL_TABLET | Freq: Once | ORAL | Status: AC
  Administered 2013-04-16: 650 mg via ORAL

## 2013-04-16 MED ORDER — SODIUM CHLORIDE 0.9 % IV SOLN: 250.0000 mL | Freq: Once | INTRAVENOUS | Status: DC

## 2013-04-16 MED ORDER — ACETAMINOPHEN 325 MG PO TABS
ORAL_TABLET | ORAL | Status: AC
  Filled 2013-04-16: qty 2

## 2013-04-16 NOTE — Patient Instructions (Signed)
Blood Transfusion Information WHAT IS A BLOOD TRANSFUSION? A transfusion is the replacement of blood or some of its parts. Blood is made up of multiple cells which provide different functions.  Red blood cells carry oxygen and are used for blood loss replacement.  White blood cells fight against infection.  Platelets control bleeding.  Plasma helps clot blood.  Other blood products are available for specialized needs, such as hemophilia or other clotting disorders. BEFORE THE TRANSFUSION  Who gives blood for transfusions?   You may be able to donate blood to be used at a later date on yourself (autologous donation).  Relatives can be asked to donate blood. This is generally not any safer than if you have received blood from a stranger. The same precautions are taken to ensure safety when a relative's blood is donated.  Healthy volunteers who are fully evaluated to make sure their blood is safe. This is blood bank blood. Transfusion therapy is the safest it has ever been in the practice of medicine. Before blood is taken from a donor, a complete history is taken to make sure that person has no history of diseases nor engages in risky social behavior (examples are intravenous drug use or sexual activity with multiple partners). The donor's travel history is screened to minimize risk of transmitting infections, such as malaria. The donated blood is tested for signs of infectious diseases, such as HIV and hepatitis. The blood is then tested to be sure it is compatible with you in order to minimize the chance of a transfusion reaction. If you or a relative donates blood, this is often done in anticipation of surgery and is not appropriate for emergency situations. It takes many days to process the donated blood. RISKS AND COMPLICATIONS Although transfusion therapy is very safe and saves many lives, the main dangers of transfusion include:   Getting an infectious disease.  Developing a  transfusion reaction. This is an allergic reaction to something in the blood you were given. Every precaution is taken to prevent this. The decision to have a blood transfusion has been considered carefully by your caregiver before blood is given. Blood is not given unless the benefits outweigh the risks. AFTER THE TRANSFUSION  Right after receiving a blood transfusion, you will usually feel much better and more energetic. This is especially true if your red blood cells have gotten low (anemic). The transfusion raises the level of the red blood cells which carry oxygen, and this usually causes an energy increase.  The nurse administering the transfusion will monitor you carefully for complications. HOME CARE INSTRUCTIONS  No special instructions are needed after a transfusion. You may find your energy is better. Speak with your caregiver about any limitations on activity for underlying diseases you may have. SEEK MEDICAL CARE IF:   Your condition is not improving after your transfusion.  You develop redness or irritation at the intravenous (IV) site. SEEK IMMEDIATE MEDICAL CARE IF:  Any of the following symptoms occur over the next 12 hours:  Shaking chills.  You have a temperature by mouth above 102 F (38.9 C), not controlled by medicine.  Chest, back, or muscle pain.  People around you feel you are not acting correctly or are confused.  Shortness of breath or difficulty breathing.  Dizziness and fainting.  You get a rash or develop hives.  You have a decrease in urine output.  Your urine turns a dark color or changes to pink, red, or brown. Any of the following   symptoms occur over the next 10 days:  You have a temperature by mouth above 102 F (38.9 C), not controlled by medicine.  Shortness of breath.  Weakness after normal activity.  The white part of the eye turns yellow (jaundice).  You have a decrease in the amount of urine or are urinating less often.  Your  urine turns a dark color or changes to pink, red, or brown. Document Released: 01/27/2000 Document Revised: 04/23/2011 Document Reviewed: 09/15/2007 [employer] Patient Information 2014 [employer].  

## 2013-04-17 ENCOUNTER — Telehealth: Payer: Self-pay | Admitting: Internal Medicine

## 2013-04-17 LAB — TYPE AND SCREEN
ABO/RH(D): O POS
Antibody Screen: NEGATIVE
UNIT DIVISION: 0
Unit division: 0

## 2013-04-17 NOTE — Telephone Encounter (Signed)
s.w. pt and advised on March appt....pt ok and aware °

## 2013-04-22 ENCOUNTER — Encounter: Payer: Self-pay | Admitting: Internal Medicine

## 2013-04-22 ENCOUNTER — Ambulatory Visit (HOSPITAL_BASED_OUTPATIENT_CLINIC_OR_DEPARTMENT_OTHER): Payer: Medicare Other | Admitting: Internal Medicine

## 2013-04-22 ENCOUNTER — Other Ambulatory Visit (HOSPITAL_BASED_OUTPATIENT_CLINIC_OR_DEPARTMENT_OTHER): Payer: Medicare Other

## 2013-04-22 ENCOUNTER — Telehealth: Payer: Self-pay | Admitting: Internal Medicine

## 2013-04-22 VITALS — BP 161/75 | HR 79 | Temp 99.0°F | Resp 18 | Ht 69.0 in | Wt 145.5 lb

## 2013-04-22 DIAGNOSIS — R5381 Other malaise: Secondary | ICD-10-CM

## 2013-04-22 DIAGNOSIS — D473 Essential (hemorrhagic) thrombocythemia: Secondary | ICD-10-CM

## 2013-04-22 DIAGNOSIS — R5383 Other fatigue: Secondary | ICD-10-CM

## 2013-04-22 LAB — CBC WITH DIFFERENTIAL/PLATELET
BASO%: 0.5 % (ref 0.0–2.0)
Basophils Absolute: 0 10*3/uL (ref 0.0–0.1)
EOS ABS: 0 10*3/uL (ref 0.0–0.5)
EOS%: 0.6 % (ref 0.0–7.0)
HEMATOCRIT: 27.3 % — AB (ref 38.4–49.9)
HGB: 9.2 g/dL — ABNORMAL LOW (ref 13.0–17.1)
LYMPH#: 0.9 10*3/uL (ref 0.9–3.3)
LYMPH%: 15.9 % (ref 14.0–49.0)
MCH: 32.3 pg (ref 27.2–33.4)
MCHC: 33.5 g/dL (ref 32.0–36.0)
MCV: 96.5 fL (ref 79.3–98.0)
MONO#: 0.5 10*3/uL (ref 0.1–0.9)
MONO%: 8.8 % (ref 0.0–14.0)
NEUT#: 4.3 10*3/uL (ref 1.5–6.5)
NEUT%: 74.2 % (ref 39.0–75.0)
Platelets: 572 10*3/uL — ABNORMAL HIGH (ref 140–400)
RBC: 2.83 10*6/uL — ABNORMAL LOW (ref 4.20–5.82)
RDW: 27.4 % — AB (ref 11.0–14.6)
WBC: 5.8 10*3/uL (ref 4.0–10.3)

## 2013-04-22 NOTE — Progress Notes (Signed)
[facility] Telephone:([phone]   Fax:(336) [phone]  OFFICE PROGRESS NOTE  Renato Shin, MD [address] [place] [address]  DIAGNOSIS: Essential thrombocythemia.   PRIOR THERAPY:  1) Hydroxyurea 1000 mg alternating with 1500 mg by mouth every other day.  2) anagrelide 2.5 mg by mouth daily discontinued recently by Dr. Royce Macadamia at [facility].   CURRENT THERAPY:  1) anagrelide 2.5 mg by mouth twice a day. 2) hydroxyurea 500 mg by mouth twice a day.  2) Integra plus 1 capsule by mouth daily for iron deficiency.  3) aspirin 81 mg by mouth daily.    INTERVAL HISTORY: Thomas Cherry 68 y.o. male returns to the clinic today for follow up visit. The patient is feeling fine today except for mild fatigue. His hemoglobin recently dropped to 6.6 g/dL and the patient received 2 units of PRBCs transfusion last week He denied having any significant chest pain, or shortness of breath with exertion. He denied having any bleeding issues. He is currently on treatment with anagrelide 2.5 mg by mouth twice a day in addition to hydroxyurea 500 mg by mouth twice a day. He is tolerating his treatment fairly well with no significant adverse effects. The patient has repeat CBC and comprehensive metabolic panel performed earlier today and he is here for evaluation and discussion of his lab results.  MEDICAL HISTORY: Past Medical History  Diagnosis Date  . GERD (gastroesophageal reflux disease)   . Allergy     heyfever  . Hypertension   . Palpitation   . Elevated platelet count     on hydroxyurea  . Heart murmur     ALLERGIES:  has No Known Allergies.  MEDICATIONS:  Current Outpatient Prescriptions  Medication Sig Dispense Refill  . anagrelide (AGRYLIN) 0.5 MG capsule TAKE 5 CAPSULES EVERY MORNING AND 5 EVERY EVENING.  900 capsule  0  . aspirin 81 MG tablet Take 81 mg by mouth daily.        Marland Kitchen diltiazem (CARDIZEM CD) 180 MG 24 hr capsule TAKE (1)  CAPSULE DAILY BY MOUTH  90 capsule  3  . esomeprazole (NEXIUM) 40 MG capsule TAKE ONE CAPSULE BY MOUTH EVERY OTHER DAY BEFORE BREAKFAST      . FeFum-FePoly-FA-B Cmp-C-Biot (INTEGRA PLUS) CAPS Take 1 capsule by mouth daily.  30 capsule  3  . folic acid (FOLVITE) 1 MG tablet Take 1 mg by mouth daily.      . hydroxyurea (HYDREA) 500 MG capsule Take 1 capsule (500 mg total) by mouth 2 (two) times daily.  180 capsule  1  . losartan-hydrochlorothiazide (HYZAAR) 50-12.5 MG per tablet TAKE 1/2 TABLET ONCE DAILY  45 tablet  2   No current facility-administered medications for this visit.    SURGICAL HISTORY:  Past Surgical History  Procedure Laterality Date  . Hernia repair      REVIEW OF SYSTEMS:  Constitutional: positive for fatigue Eyes: negative Ears, nose, mouth, throat, and face: negative Respiratory: positive for dyspnea on exertion Cardiovascular: negative Gastrointestinal: negative Genitourinary:negative Integument/breast: negative Hematologic/lymphatic: negative Musculoskeletal:negative Neurological: positive for dizziness Behavioral/Psych: negative Endocrine: negative Allergic/Immunologic: negative   PHYSICAL EXAMINATION: General appearance: alert, cooperative, fatigued and no distress Head: Normocephalic, without obvious abnormality, atraumatic Neck: no adenopathy, no JVD, supple, symmetrical, trachea midline and thyroid not enlarged, symmetric, no tenderness/mass/nodules Lymph nodes: Cervical, supraclavicular, and axillary nodes normal. Resp: clear to auscultation bilaterally Cardio: regular rate and rhythm, S1, S2 normal, no murmur,  click, rub or gallop GI: soft, non-tender; bowel sounds normal; no masses,  no organomegaly Extremities: extremities normal, atraumatic, no cyanosis or edema Neurologic: Alert and oriented X 3, normal strength and tone. Normal symmetric reflexes. Normal coordination and gait  ECOG PERFORMANCE STATUS: 1 - Symptomatic but completely  ambulatory  Blood pressure 161/75, pulse 79, temperature 99 F (37.2 C), temperature source Oral, resp. rate 18, height 5\' 9"  (1.753 m), weight 145 lb 8 oz (65.998 kg), SpO2 100.00%.  LABORATORY DATA: Lab Results  Component Value Date   WBC 5.8 04/22/2013   HGB 9.2* 04/22/2013   HCT 27.3* 04/22/2013   MCV 96.5 04/22/2013   PLT 572* 04/22/2013      Chemistry      Component Value Date/Time   NA 145 04/15/2013 1435   NA 145 04/15/2012 1142   K 4.0 04/15/2013 1435   K 4.8 04/15/2012 1142   CL 110* 07/29/2012 1132   CL 110 04/15/2012 1142   CO2 26 04/15/2013 1435   CO2 27 04/15/2012 1142   BUN 35.8* 04/15/2013 1435   BUN 27* 04/15/2012 1142   CREATININE 1.5* 04/15/2013 1435   CREATININE 1.03 04/15/2012 1142      Component Value Date/Time   CALCIUM 8.8 04/15/2013 1435   CALCIUM 8.6 04/15/2012 1142   ALKPHOS 64 04/15/2013 1435   ALKPHOS 57 04/15/2012 1142   AST 20 04/15/2013 1435   AST 28 04/15/2012 1142   ALT 13 04/15/2013 1435   ALT <5 04/15/2012 1142   BILITOT 0.30 04/15/2013 1435   BILITOT 0.3 04/15/2012 1142       RADIOGRAPHIC STUDIES: No results found.  ASSESSMENT AND PLAN: this is a very pleasant 67 years old white male with essential thrombocythemia currently on treatment with anagrelide as well as hydroxyurea and tolerating his treatment fairly well. His platelets count has increased on the recent blood work. I recommended for the patient to continue his current treatment with anagrelide to 2.5 mg by mouth twice a day in addition to hydroxyurea to 500 mg by mouth twice a day. I would see him back for followup visit in one month with repeat CBC, comprehensive metabolic panel and LDH. He was advised to call me immediately if he has any concerning symptoms in the interval.  The patient voices understanding of current disease status and treatment options and is in agreement with the current care plan.  All questions were answered. The patient knows to call the clinic with any problems, questions or concerns.  We can certainly see the patient much sooner if necessary.  Disclaimer: This note was dictated with voice recognition software. Similar sounding words can inadvertently be transcribed and may not be corrected upon review.

## 2013-04-22 NOTE — Telephone Encounter (Signed)
gv adn printed appt sched and avs for pt for April thru June...Marland Kitchenper Dr MM pt needs monthly labs

## 2013-04-22 NOTE — Telephone Encounter (Signed)
gv and printed appt sched adn avs for pt for April...Marland Kitchenper Dr. Tyrell Antonio monthly lab

## 2013-05-06 ENCOUNTER — Other Ambulatory Visit: Payer: Medicare Other

## 2013-05-20 ENCOUNTER — Telehealth: Payer: Self-pay | Admitting: Internal Medicine

## 2013-05-20 ENCOUNTER — Other Ambulatory Visit (HOSPITAL_BASED_OUTPATIENT_CLINIC_OR_DEPARTMENT_OTHER): Payer: Medicare Other

## 2013-05-20 ENCOUNTER — Ambulatory Visit (HOSPITAL_BASED_OUTPATIENT_CLINIC_OR_DEPARTMENT_OTHER): Payer: Medicare Other | Admitting: Internal Medicine

## 2013-05-20 ENCOUNTER — Encounter: Payer: Self-pay | Admitting: Internal Medicine

## 2013-05-20 ENCOUNTER — Other Ambulatory Visit: Payer: Self-pay | Admitting: *Deleted

## 2013-05-20 VITALS — BP 134/63 | HR 79 | Temp 97.6°F | Resp 18 | Ht 69.0 in | Wt 145.2 lb

## 2013-05-20 DIAGNOSIS — D473 Essential (hemorrhagic) thrombocythemia: Secondary | ICD-10-CM

## 2013-05-20 LAB — COMPREHENSIVE METABOLIC PANEL (CC13)
ALBUMIN: 3.6 g/dL (ref 3.5–5.0)
ALT: 13 U/L (ref 0–55)
ANION GAP: 6 meq/L (ref 3–11)
AST: 19 U/L (ref 5–34)
Alkaline Phosphatase: 65 U/L (ref 40–150)
BUN: 39.1 mg/dL — ABNORMAL HIGH (ref 7.0–26.0)
CALCIUM: 8.9 mg/dL (ref 8.4–10.4)
CHLORIDE: 110 meq/L — AB (ref 98–109)
CO2: 27 meq/L (ref 22–29)
CREATININE: 1.5 mg/dL — AB (ref 0.7–1.3)
Glucose: 121 mg/dl (ref 70–140)
POTASSIUM: 4.3 meq/L (ref 3.5–5.1)
SODIUM: 144 meq/L (ref 136–145)
TOTAL PROTEIN: 6.4 g/dL (ref 6.4–8.3)
Total Bilirubin: 0.33 mg/dL (ref 0.20–1.20)

## 2013-05-20 LAB — CBC WITH DIFFERENTIAL/PLATELET
BASO%: 0.5 % (ref 0.0–2.0)
Basophils Absolute: 0 10*3/uL (ref 0.0–0.1)
EOS%: 0.7 % (ref 0.0–7.0)
Eosinophils Absolute: 0 10*3/uL (ref 0.0–0.5)
HCT: 25.5 % — ABNORMAL LOW (ref 38.4–49.9)
HGB: 8.3 g/dL — ABNORMAL LOW (ref 13.0–17.1)
LYMPH#: 1.1 10*3/uL (ref 0.9–3.3)
LYMPH%: 25.9 % (ref 14.0–49.0)
MCH: 32 pg (ref 27.2–33.4)
MCHC: 32.7 g/dL (ref 32.0–36.0)
MCV: 97.9 fL (ref 79.3–98.0)
MONO#: 0.5 10*3/uL (ref 0.1–0.9)
MONO%: 12 % (ref 0.0–14.0)
NEUT#: 2.6 10*3/uL (ref 1.5–6.5)
NEUT%: 60.9 % (ref 39.0–75.0)
Platelets: 496 10*3/uL — ABNORMAL HIGH (ref 140–400)
RBC: 2.61 10*6/uL — ABNORMAL LOW (ref 4.20–5.82)
RDW: 29.4 % — ABNORMAL HIGH (ref 11.0–14.6)
WBC: 4.3 10*3/uL (ref 4.0–10.3)

## 2013-05-20 LAB — LACTATE DEHYDROGENASE (CC13): LDH: 373 U/L — ABNORMAL HIGH (ref 125–245)

## 2013-05-20 NOTE — Telephone Encounter (Signed)
gv pt appt schedule for april thru june

## 2013-05-20 NOTE — Progress Notes (Signed)
[place] Telephone:([phone]   Fax:(336) [phone]  OFFICE PROGRESS NOTE  Renato Shin, MD [address] [place] [address]  DIAGNOSIS: Essential thrombocythemia.   PRIOR THERAPY:  1) Hydroxyurea 1000 mg alternating with 1500 mg by mouth every other day.  2) anagrelide 2.5 mg by mouth daily discontinued recently by Dr. Royce Macadamia at [facility].   CURRENT THERAPY:  1) anagrelide 2.5 mg by mouth twice a day. 2) hydroxyurea 500 mg by mouth twice a day.  2) Integra plus 1 capsule by mouth daily for iron deficiency.  3) aspirin 81 mg by mouth daily.    INTERVAL HISTORY: Thomas Cherry 68 y.o. male returns to the clinic today for follow up visit. The patient is feeling fine today except for mild fatigue. He denied having any significant chest pain, or shortness of breath with exertion. He denied having any bleeding issues. He is currently on treatment with anagrelide 2.5 mg by mouth twice a day in addition to hydroxyurea 500 mg by mouth twice a day. He is tolerating his treatment fairly well with no significant adverse effects. The patient has repeat CBC and comprehensive metabolic panel performed earlier today and he is here for evaluation and discussion of his lab results.  MEDICAL HISTORY: Past Medical History  Diagnosis Date  . GERD (gastroesophageal reflux disease)   . Allergy     heyfever  . Hypertension   . Palpitation   . Elevated platelet count     on hydroxyurea  . Heart murmur     ALLERGIES:  has No Known Allergies.  MEDICATIONS:  Current Outpatient Prescriptions  Medication Sig Dispense Refill  . anagrelide (AGRYLIN) 0.5 MG capsule TAKE 5 CAPSULES EVERY MORNING AND 5 EVERY EVENING.  900 capsule  0  . aspirin 81 MG tablet Take 81 mg by mouth daily.        Marland Kitchen diltiazem (CARDIZEM CD) 180 MG 24 hr capsule TAKE (1) CAPSULE DAILY BY MOUTH  90 capsule  3  . esomeprazole (NEXIUM) 40 MG capsule TAKE ONE CAPSULE BY MOUTH EVERY  OTHER DAY BEFORE BREAKFAST      . FeFum-FePoly-FA-B Cmp-C-Biot (INTEGRA PLUS) CAPS Take 1 capsule by mouth daily.  30 capsule  3  . folic acid (FOLVITE) 1 MG tablet Take 1 mg by mouth daily.      . hydroxyurea (HYDREA) 500 MG capsule Take 1 capsule (500 mg total) by mouth 2 (two) times daily.  180 capsule  1  . losartan-hydrochlorothiazide (HYZAAR) 50-12.5 MG per tablet TAKE 1/2 TABLET ONCE DAILY  45 tablet  2   No current facility-administered medications for this visit.    SURGICAL HISTORY:  Past Surgical History  Procedure Laterality Date  . Hernia repair      REVIEW OF SYSTEMS:  Constitutional: positive for fatigue Eyes: negative Ears, nose, mouth, throat, and face: negative Respiratory: positive for dyspnea on exertion Cardiovascular: negative Gastrointestinal: negative Genitourinary:negative Integument/breast: negative Hematologic/lymphatic: negative Musculoskeletal:negative Neurological: positive for dizziness Behavioral/Psych: negative Endocrine: negative Allergic/Immunologic: negative   PHYSICAL EXAMINATION: General appearance: alert, cooperative, fatigued and no distress Head: Normocephalic, without obvious abnormality, atraumatic Neck: no adenopathy, no JVD, supple, symmetrical, trachea midline and thyroid not enlarged, symmetric, no tenderness/mass/nodules Lymph nodes: Cervical, supraclavicular, and axillary nodes normal. Resp: clear to auscultation bilaterally Cardio: regular rate and rhythm, S1, S2 normal, no murmur, click, rub or gallop GI: soft, non-tender; bowel sounds normal; no masses,  no organomegaly Extremities: extremities normal,  atraumatic, no cyanosis or edema Neurologic: Alert and oriented X 3, normal strength and tone. Normal symmetric reflexes. Normal coordination and gait  ECOG PERFORMANCE STATUS: 1 - Symptomatic but completely ambulatory  Blood pressure 134/63, pulse 79, temperature 97.6 F (36.4 C), temperature source Oral, resp. rate 18,  height 5\' 9"  (1.753 m), weight 145 lb 3.2 oz (65.862 kg).  LABORATORY DATA: Lab Results  Component Value Date   WBC 4.3 05/20/2013   HGB 8.3* 05/20/2013   HCT 25.5* 05/20/2013   MCV 97.9 05/20/2013   PLT 496* 05/20/2013      Chemistry      Component Value Date/Time   NA 144 05/20/2013 0936   NA 145 04/15/2012 1142   K 4.3 05/20/2013 0936   K 4.8 04/15/2012 1142   CL 110* 07/29/2012 1132   CL 110 04/15/2012 1142   CO2 27 05/20/2013 0936   CO2 27 04/15/2012 1142   BUN 39.1* 05/20/2013 0936   BUN 27* 04/15/2012 1142   CREATININE 1.5* 05/20/2013 0936   CREATININE 1.03 04/15/2012 1142      Component Value Date/Time   CALCIUM 8.9 05/20/2013 0936   CALCIUM 8.6 04/15/2012 1142   ALKPHOS 65 05/20/2013 0936   ALKPHOS 57 04/15/2012 1142   AST 19 05/20/2013 0936   AST 28 04/15/2012 1142   ALT 13 05/20/2013 0936   ALT <5 04/15/2012 1142   BILITOT 0.33 05/20/2013 0936   BILITOT 0.3 04/15/2012 1142       RADIOGRAPHIC STUDIES: No results found.  ASSESSMENT AND PLAN: this is a very pleasant 68 years old white male with essential thrombocythemia currently on treatment with anagrelide as well as hydroxyurea and tolerating his treatment fairly well. His platelets count has increased on the recent blood work. I recommended for the patient to continue his current treatment with anagrelide to 2.5 mg by mouth twice a day in addition to hydroxyurea to 500 mg by mouth twice a day. I would see him back for followup visit in one month with repeat CBC, and comprehensive metabolic panel, but he would also have repeat CBC and comprehensive metabolic panel in 2 weeks. He was advised to call me immediately if he has any concerning symptoms in the interval.  The patient voices understanding of current disease status and treatment options and is in agreement with the current care plan.  All questions were answered. The patient knows to call the clinic with any problems, questions or concerns. We can certainly see the patient much sooner if  necessary.  Disclaimer: This note was dictated with voice recognition software. Similar sounding words can inadvertently be transcribed and may not be corrected upon review.

## 2013-06-01 ENCOUNTER — Telehealth: Payer: Self-pay | Admitting: Internal Medicine

## 2013-06-01 NOTE — Telephone Encounter (Signed)
pt called to r/s lab...done...pt aware of new d.t °

## 2013-06-03 ENCOUNTER — Other Ambulatory Visit: Payer: Medicare Other

## 2013-06-05 ENCOUNTER — Other Ambulatory Visit: Payer: Self-pay | Admitting: Endocrinology

## 2013-06-05 ENCOUNTER — Telehealth: Payer: Self-pay | Admitting: Internal Medicine

## 2013-06-05 NOTE — Telephone Encounter (Signed)
pt called to r/s 4.28 lab to 4.29.15 done....pt awareof d.t.

## 2013-06-09 ENCOUNTER — Other Ambulatory Visit: Payer: Medicare Other

## 2013-06-10 ENCOUNTER — Telehealth: Payer: Self-pay | Admitting: Medical Oncology

## 2013-06-10 ENCOUNTER — Other Ambulatory Visit: Payer: Self-pay | Admitting: Medical Oncology

## 2013-06-10 ENCOUNTER — Other Ambulatory Visit (HOSPITAL_BASED_OUTPATIENT_CLINIC_OR_DEPARTMENT_OTHER): Payer: Medicare Other

## 2013-06-10 DIAGNOSIS — R5381 Other malaise: Secondary | ICD-10-CM

## 2013-06-10 DIAGNOSIS — R5383 Other fatigue: Secondary | ICD-10-CM

## 2013-06-10 DIAGNOSIS — D473 Essential (hemorrhagic) thrombocythemia: Secondary | ICD-10-CM

## 2013-06-10 LAB — COMPREHENSIVE METABOLIC PANEL (CC13)
ALBUMIN: 3.7 g/dL (ref 3.5–5.0)
ALT: 12 U/L (ref 0–55)
AST: 23 U/L (ref 5–34)
Alkaline Phosphatase: 66 U/L (ref 40–150)
Anion Gap: 8 mEq/L (ref 3–11)
BUN: 41.5 mg/dL — AB (ref 7.0–26.0)
CALCIUM: 8.8 mg/dL (ref 8.4–10.4)
CHLORIDE: 115 meq/L — AB (ref 98–109)
CO2: 24 mEq/L (ref 22–29)
Creatinine: 1.5 mg/dL — ABNORMAL HIGH (ref 0.7–1.3)
Glucose: 87 mg/dl (ref 70–140)
POTASSIUM: 4.1 meq/L (ref 3.5–5.1)
Sodium: 147 mEq/L — ABNORMAL HIGH (ref 136–145)
Total Bilirubin: 0.27 mg/dL (ref 0.20–1.20)
Total Protein: 6.4 g/dL (ref 6.4–8.3)

## 2013-06-10 LAB — CBC WITH DIFFERENTIAL/PLATELET
BASO%: 0.2 % (ref 0.0–2.0)
Basophils Absolute: 0 10*3/uL (ref 0.0–0.1)
EOS ABS: 0 10*3/uL (ref 0.0–0.5)
EOS%: 0.1 % (ref 0.0–7.0)
HCT: 24.7 % — ABNORMAL LOW (ref 38.4–49.9)
HGB: 7.6 g/dL — ABNORMAL LOW (ref 13.0–17.1)
LYMPH%: 23.5 % (ref 14.0–49.0)
MCH: 29.7 pg (ref 27.2–33.4)
MCHC: 30.8 g/dL — ABNORMAL LOW (ref 32.0–36.0)
MCV: 96.5 fL (ref 79.3–98.0)
MONO#: 1.3 10*3/uL — ABNORMAL HIGH (ref 0.1–0.9)
MONO%: 12.8 % (ref 0.0–14.0)
NEUT%: 63.4 % (ref 39.0–75.0)
NEUTROS ABS: 6.5 10*3/uL (ref 1.5–6.5)
PLATELETS: 623 10*3/uL — AB (ref 140–400)
RBC: 2.56 10*6/uL — AB (ref 4.20–5.82)
RDW: 30.2 % — ABNORMAL HIGH (ref 11.0–14.6)
WBC: 10.3 10*3/uL (ref 4.0–10.3)
lymph#: 2.4 10*3/uL (ref 0.9–3.3)
nRBC: 1 % — ABNORMAL HIGH (ref 0–0)

## 2013-06-10 LAB — TECHNOLOGIST REVIEW

## 2013-06-10 LAB — HOLD TUBE, BLOOD BANK

## 2013-06-10 NOTE — Telephone Encounter (Signed)
Pt hgb 7.6 -marks feels fine. He started [facility] last week per Dr Loanne Drilling. I called Dr Royce Macadamia re transfusion parameters and left message for nurse to call me back.

## 2013-06-10 NOTE — Telephone Encounter (Signed)
Per Dr Royce Macadamia transfuse per Dr Julien Nordmann recommendation -pt notified and said he will wait until friday to see how he feels . He does not fel like he needs transfusion today .

## 2013-06-11 ENCOUNTER — Other Ambulatory Visit: Payer: Self-pay | Admitting: Medical Oncology

## 2013-06-11 ENCOUNTER — Ambulatory Visit (HOSPITAL_COMMUNITY)
Admission: RE | Admit: 2013-06-11 | Discharge: 2013-06-11 | Disposition: A | Discharge status: Home / Self Care | Payer: Medicare Other | Source: Ambulatory Visit | Attending: Internal Medicine | Admitting: Internal Medicine

## 2013-06-11 ENCOUNTER — Telehealth: Payer: Self-pay | Admitting: Medical Oncology

## 2013-06-11 DIAGNOSIS — D649 Anemia, unspecified: Secondary | ICD-10-CM

## 2013-06-11 NOTE — Progress Notes (Signed)
HAR done

## 2013-06-11 NOTE — Telephone Encounter (Signed)
Pt wants to proceed with transfusion. Appointment given

## 2013-06-12 ENCOUNTER — Ambulatory Visit (HOSPITAL_BASED_OUTPATIENT_CLINIC_OR_DEPARTMENT_OTHER): Payer: Medicare Other

## 2013-06-12 ENCOUNTER — Ambulatory Visit (HOSPITAL_COMMUNITY)
Admission: RE | Admit: 2013-06-12 | Discharge: 2013-06-12 | Disposition: A | Discharge status: Home / Self Care | Payer: Medicare Other | Source: Ambulatory Visit | Attending: Internal Medicine | Admitting: Internal Medicine

## 2013-06-12 VITALS — BP 166/69 | HR 56 | Temp 98.5°F | Resp 18

## 2013-06-12 DIAGNOSIS — D473 Essential (hemorrhagic) thrombocythemia: Secondary | ICD-10-CM | POA: Insufficient documentation

## 2013-06-12 DIAGNOSIS — D649 Anemia, unspecified: Secondary | ICD-10-CM

## 2013-06-12 DIAGNOSIS — D7581 Myelofibrosis: Secondary | ICD-10-CM | POA: Insufficient documentation

## 2013-06-12 LAB — PREPARE RBC (CROSSMATCH)

## 2013-06-12 MED ORDER — DIPHENHYDRAMINE HCL 25 MG PO CAPS
ORAL_CAPSULE | ORAL | Status: AC
  Filled 2013-06-12: qty 1

## 2013-06-12 MED ORDER — ACETAMINOPHEN 325 MG PO TABS
ORAL_TABLET | ORAL | Status: AC
  Filled 2013-06-12: qty 2

## 2013-06-12 MED ORDER — SODIUM CHLORIDE 0.9 % IV SOLN
250.0000 mL | Freq: Once | INTRAVENOUS | Status: AC
  Administered 2013-06-12: 250 mL via INTRAVENOUS

## 2013-06-12 MED ORDER — DIPHENHYDRAMINE HCL 25 MG PO CAPS
25.0000 mg | ORAL_CAPSULE | Freq: Once | ORAL | Status: AC
  Administered 2013-06-12: 25 mg via ORAL

## 2013-06-12 MED ORDER — ACETAMINOPHEN 325 MG PO TABS
650.0000 mg | ORAL_TABLET | Freq: Once | ORAL | Status: AC
  Administered 2013-06-12: 650 mg via ORAL

## 2013-06-12 NOTE — Patient Instructions (Signed)
Blood Transfusion Information WHAT IS A BLOOD TRANSFUSION? A transfusion is the replacement of blood or some of its parts. Blood is made up of multiple cells which provide different functions.  Red blood cells carry oxygen and are used for blood loss replacement.  White blood cells fight against infection.  Platelets control bleeding.  Plasma helps clot blood.  Other blood products are available for specialized needs, such as hemophilia or other clotting disorders. BEFORE THE TRANSFUSION  Who gives blood for transfusions?   You may be able to donate blood to be used at a later date on yourself (autologous donation).  Relatives can be asked to donate blood. This is generally not any safer than if you have received blood from a stranger. The same precautions are taken to ensure safety when a relative's blood is donated.  Healthy volunteers who are fully evaluated to make sure their blood is safe. This is blood bank blood. Transfusion therapy is the safest it has ever been in the practice of medicine. Before blood is taken from a donor, a complete history is taken to make sure that person has no history of diseases nor engages in risky social behavior (examples are intravenous drug use or sexual activity with multiple partners). The donor's travel history is screened to minimize risk of transmitting infections, such as malaria. The donated blood is tested for signs of infectious diseases, such as HIV and hepatitis. The blood is then tested to be sure it is compatible with you in order to minimize the chance of a transfusion reaction. If you or a relative donates blood, this is often done in anticipation of surgery and is not appropriate for emergency situations. It takes many days to process the donated blood. RISKS AND COMPLICATIONS Although transfusion therapy is very safe and saves many lives, the main dangers of transfusion include:   Getting an infectious disease.  Developing a  transfusion reaction. This is an allergic reaction to something in the blood you were given. Every precaution is taken to prevent this. The decision to have a blood transfusion has been considered carefully by your caregiver before blood is given. Blood is not given unless the benefits outweigh the risks. AFTER THE TRANSFUSION  Right after receiving a blood transfusion, you will usually feel much better and more energetic. This is especially true if your red blood cells have gotten low (anemic). The transfusion raises the level of the red blood cells which carry oxygen, and this usually causes an energy increase.  The nurse administering the transfusion will monitor you carefully for complications. HOME CARE INSTRUCTIONS  No special instructions are needed after a transfusion. You may find your energy is better. Speak with your caregiver about any limitations on activity for underlying diseases you may have. SEEK MEDICAL CARE IF:   Your condition is not improving after your transfusion.  You develop redness or irritation at the intravenous (IV) site. SEEK IMMEDIATE MEDICAL CARE IF:  Any of the following symptoms occur over the next 12 hours:  Shaking chills.  You have a temperature by mouth above 102 F (38.9 C), not controlled by medicine.  Chest, back, or muscle pain.  People around you feel you are not acting correctly or are confused.  Shortness of breath or difficulty breathing.  Dizziness and fainting.  You get a rash or develop hives.  You have a decrease in urine output.  Your urine turns a dark color or changes to pink, red, or brown. Any of the following   symptoms occur over the next 10 days:  You have a temperature by mouth above 102 F (38.9 C), not controlled by medicine.  Shortness of breath.  Weakness after normal activity.  The white part of the eye turns yellow (jaundice).  You have a decrease in the amount of urine or are urinating less often.  Your  urine turns a dark color or changes to pink, red, or brown. Document Released: 01/27/2000 Document Revised: 04/23/2011 Document Reviewed: 09/15/2007 [employer] Patient Information 2014 [employer].  

## 2013-06-13 LAB — TYPE AND SCREEN
ABO/RH(D): O POS
Antibody Screen: NEGATIVE
UNIT DIVISION: 0
Unit division: 0

## 2013-06-17 ENCOUNTER — Other Ambulatory Visit (HOSPITAL_BASED_OUTPATIENT_CLINIC_OR_DEPARTMENT_OTHER): Payer: Medicare Other

## 2013-06-17 ENCOUNTER — Ambulatory Visit (HOSPITAL_BASED_OUTPATIENT_CLINIC_OR_DEPARTMENT_OTHER): Payer: Medicare Other | Admitting: Internal Medicine

## 2013-06-17 ENCOUNTER — Encounter: Payer: Self-pay | Admitting: Internal Medicine

## 2013-06-17 VITALS — BP 166/67 | HR 65 | Temp 97.5°F | Resp 18 | Ht 69.0 in | Wt 144.0 lb

## 2013-06-17 DIAGNOSIS — D7581 Myelofibrosis: Secondary | ICD-10-CM | POA: Insufficient documentation

## 2013-06-17 DIAGNOSIS — D473 Essential (hemorrhagic) thrombocythemia: Secondary | ICD-10-CM

## 2013-06-17 DIAGNOSIS — D649 Anemia, unspecified: Secondary | ICD-10-CM

## 2013-06-17 LAB — CBC WITH DIFFERENTIAL/PLATELET
BASO%: 0.5 % (ref 0.0–2.0)
BASOS ABS: 0 10*3/uL (ref 0.0–0.1)
EOS%: 0.1 % (ref 0.0–7.0)
Eosinophils Absolute: 0 10*3/uL (ref 0.0–0.5)
HEMATOCRIT: 30.9 % — AB (ref 38.4–49.9)
HEMOGLOBIN: 9.9 g/dL — AB (ref 13.0–17.1)
LYMPH#: 2.1 10*3/uL (ref 0.9–3.3)
LYMPH%: 25.5 % (ref 14.0–49.0)
MCH: 29.6 pg (ref 27.2–33.4)
MCHC: 32 g/dL (ref 32.0–36.0)
MCV: 92.2 fL (ref 79.3–98.0)
MONO#: 1.3 10*3/uL — ABNORMAL HIGH (ref 0.1–0.9)
MONO%: 15.4 % — ABNORMAL HIGH (ref 0.0–14.0)
NEUT#: 4.8 10*3/uL (ref 1.5–6.5)
NEUT%: 58.5 % (ref 39.0–75.0)
PLATELETS: 372 10*3/uL (ref 140–400)
RBC: 3.35 10*6/uL — ABNORMAL LOW (ref 4.20–5.82)
RDW: 26.7 % — ABNORMAL HIGH (ref 11.0–14.6)
WBC: 8.2 10*3/uL (ref 4.0–10.3)
nRBC: 2 % — ABNORMAL HIGH (ref 0–0)

## 2013-06-17 LAB — COMPREHENSIVE METABOLIC PANEL (CC13)
ALBUMIN: 3.9 g/dL (ref 3.5–5.0)
ALT: 16 U/L (ref 0–55)
AST: 25 U/L (ref 5–34)
Alkaline Phosphatase: 68 U/L (ref 40–150)
Anion Gap: 7 mEq/L (ref 3–11)
BUN: 38.2 mg/dL — AB (ref 7.0–26.0)
CALCIUM: 9.3 mg/dL (ref 8.4–10.4)
CO2: 26 mEq/L (ref 22–29)
CREATININE: 1.5 mg/dL — AB (ref 0.7–1.3)
Chloride: 109 mEq/L (ref 98–109)
GLUCOSE: 88 mg/dL (ref 70–140)
POTASSIUM: 4.7 meq/L (ref 3.5–5.1)
Sodium: 142 mEq/L (ref 136–145)
Total Bilirubin: 0.43 mg/dL (ref 0.20–1.20)
Total Protein: 6.6 g/dL (ref 6.4–8.3)

## 2013-06-17 LAB — TECHNOLOGIST REVIEW

## 2013-06-17 NOTE — Progress Notes (Signed)
[place] Telephone:([phone]   Fax:(336) [phone]  OFFICE PROGRESS NOTE  Renato Shin, MD [address] [place] [address]  DIAGNOSIS: Essential thrombocythemia, now with myelofibrosis based on a bone marrow biopsy and aspirate performed at [facility] in April 2015.   PRIOR THERAPY:  1) Hydroxyurea 500 mg by mouth twice a day discontinued April 2015.  2) anagrelide 2.5 mg by mouth twice a day discontinued April 2015.   CURRENT THERAPY:  1) Jakafi 20 mg by mouth twice a day started April 2050  2) Integra plus 1 capsule by mouth daily for iron deficiency.  3) aspirin 81 mg by mouth daily.    INTERVAL HISTORY: Thomas Cherry 68 y.o. male returns to the clinic today for follow up visit. The patient is feeling fine today except for mild fatigue. He recently underwent a bone marrow biopsy at [facility] and it showed hypercellular marrow was clustering of mega-value sites but without increase in blast by immunohistochemistry and reticulation stain showed moderate fibrosis. The patient was seen by Dr. Royce Macadamia and his treatment with anagrelide and hydroxyurea was discontinued. He was started on treatment with Jakafi 20 mg by mouth twice a day. He is tolerating his treatment fairly well with no significant adverse effects. He was also seen by Dr. Clydene Laming at [facility] for evaluation and consideration of allogeneic stem cell transplant. His brother and sister were tested but the matching results are still pending. He recently received 2 units of PRBCs transfusion for anemia. He denied having any significant chest pain, or shortness of breath with exertion. He denied having any bleeding issues. The patient has repeat CBC and comprehensive metabolic panel performed earlier today and he is here for evaluation and discussion of his lab results.  MEDICAL HISTORY: Past Medical History  Diagnosis Date  . GERD (gastroesophageal reflux disease)   .  Allergy     heyfever  . Hypertension   . Palpitation   . Elevated platelet count     on hydroxyurea  . Heart murmur     ALLERGIES:  has No Known Allergies.  MEDICATIONS:  Current Outpatient Prescriptions  Medication Sig Dispense Refill  . aspirin 81 MG tablet Take 81 mg by mouth daily.        Marland Kitchen diltiazem (CARDIZEM CD) 180 MG 24 hr capsule TAKE (1) CAPSULE DAILY BY MOUTH  90 capsule  3  . esomeprazole (NEXIUM) 40 MG capsule TAKE ONE CAPSULE BY MOUTH EVERY OTHER DAY BEFORE BREAKFAST      . FeFum-FePoly-FA-B Cmp-C-Biot (INTEGRA PLUS) CAPS Take 1 capsule by mouth daily.  30 capsule  3  . folic acid (FOLVITE) 1 MG tablet Take 1 mg by mouth daily.      Marland Kitchen losartan-hydrochlorothiazide (HYZAAR) 100-25 MG per tablet TAKE (1/2) TABLET DAILY  45 tablet  3  . losartan-hydrochlorothiazide (HYZAAR) 50-12.5 MG per tablet TAKE 1/2 TABLET ONCE DAILY  45 tablet  2  . ruxolitinib phosphate (JAKAFI) 20 MG tablet 20 mg.       No current facility-administered medications for this visit.    SURGICAL HISTORY:  Past Surgical History  Procedure Laterality Date  . Hernia repair      REVIEW OF SYSTEMS:  Constitutional: positive for fatigue Eyes: negative Ears, nose, mouth, throat, and face: negative Respiratory: positive for dyspnea on exertion Cardiovascular: negative Gastrointestinal: negative Genitourinary:negative Integument/breast: negative Hematologic/lymphatic: negative Musculoskeletal:negative Neurological: positive for dizziness Behavioral/Psych: negative Endocrine: negative Allergic/Immunologic:  negative   PHYSICAL EXAMINATION: General appearance: alert, cooperative, fatigued and no distress Head: Normocephalic, without obvious abnormality, atraumatic Neck: no adenopathy, no JVD, supple, symmetrical, trachea midline and thyroid not enlarged, symmetric, no tenderness/mass/nodules Lymph nodes: Cervical, supraclavicular, and axillary nodes normal. Resp: clear to auscultation  bilaterally Cardio: regular rate and rhythm, S1, S2 normal, no murmur, click, rub or gallop GI: soft, non-tender; bowel sounds normal; no masses,  no organomegaly Extremities: extremities normal, atraumatic, no cyanosis or edema Neurologic: Alert and oriented X 3, normal strength and tone. Normal symmetric reflexes. Normal coordination and gait  ECOG PERFORMANCE STATUS: 1 - Symptomatic but completely ambulatory  Blood pressure 166/67, pulse 65, temperature 97.5 F (36.4 C), temperature source Oral, resp. rate 18, height $RemoveBe'5\' 9"'YSmeNWXZG$  (1.753 m), weight 144 lb (65.318 kg), SpO2 100.00%.  LABORATORY DATA: Lab Results  Component Value Date   WBC 8.2 06/17/2013   HGB 9.9* 06/17/2013   HCT 30.9* 06/17/2013   MCV 92.2 06/17/2013   PLT 372 06/17/2013      Chemistry      Component Value Date/Time   NA 147* 06/10/2013 1043   NA 145 04/15/2012 1142   K 4.1 06/10/2013 1043   K 4.8 04/15/2012 1142   CL 110* 07/29/2012 1132   CL 110 04/15/2012 1142   CO2 24 06/10/2013 1043   CO2 27 04/15/2012 1142   BUN 41.5* 06/10/2013 1043   BUN 27* 04/15/2012 1142   CREATININE 1.5* 06/10/2013 1043   CREATININE 1.03 04/15/2012 1142      Component Value Date/Time   CALCIUM 8.8 06/10/2013 1043   CALCIUM 8.6 04/15/2012 1142   ALKPHOS 66 06/10/2013 1043   ALKPHOS 57 04/15/2012 1142   AST 23 06/10/2013 1043   AST 28 04/15/2012 1142   ALT 12 06/10/2013 1043   ALT <5 04/15/2012 1142   BILITOT 0.27 06/10/2013 1043   BILITOT 0.3 04/15/2012 1142       RADIOGRAPHIC STUDIES: No results found.  ASSESSMENT AND PLAN: this is a very pleasant 68 years old white male with essential thrombocythemia status post treatment with anagrelide as well as hydroxyurea by the recent bone marrow biopsy showed evidence for myelofibrosis. His treatment with anagrelide and hydroxyurea was discontinued and the patient is currently on Jakafi 20 mg by mouth twice a day and tolerating it fairly well. He is also currently evaluated for allogeneic stem cell transplant. I  recommended for the patient to continue his current treatment with [facility] for now. I will continue to monitor his blood count at regular basis. We will try to decrease the frequency of PRBCs transfusion unless absolutely necessary because of his current consideration for allogeneic stem cell transplant.  I would see him back for followup visit in one month with repeat CBC, and comprehensive metabolic panel. He was advised to call me immediately if he has any concerning symptoms in the interval.  The patient voices understanding of current disease status and treatment options and is in agreement with the current care plan.  All questions were answered. The patient knows to call the clinic with any problems, questions or concerns. We can certainly see the patient much sooner if necessary.  Disclaimer: This note was dictated with voice recognition software. Similar sounding words can inadvertently be transcribed and may not be corrected upon review.

## 2013-06-19 ENCOUNTER — Telehealth: Payer: Self-pay | Admitting: Internal Medicine

## 2013-06-19 NOTE — Telephone Encounter (Signed)
S/w the pt and he is aware of his June appts °

## 2013-07-01 ENCOUNTER — Other Ambulatory Visit (HOSPITAL_BASED_OUTPATIENT_CLINIC_OR_DEPARTMENT_OTHER): Payer: Medicare Other

## 2013-07-01 ENCOUNTER — Other Ambulatory Visit: Payer: Self-pay | Admitting: *Deleted

## 2013-07-01 DIAGNOSIS — D649 Anemia, unspecified: Secondary | ICD-10-CM

## 2013-07-01 DIAGNOSIS — D473 Essential (hemorrhagic) thrombocythemia: Secondary | ICD-10-CM

## 2013-07-01 LAB — CBC WITH DIFFERENTIAL/PLATELET
BASO%: 1.4 % (ref 0.0–2.0)
Basophils Absolute: 0.1 10*3/uL (ref 0.0–0.1)
EOS%: 0.2 % (ref 0.0–7.0)
Eosinophils Absolute: 0 10*3/uL (ref 0.0–0.5)
HEMATOCRIT: 27 % — AB (ref 38.4–49.9)
HGB: 8.6 g/dL — ABNORMAL LOW (ref 13.0–17.1)
LYMPH%: 25.9 % (ref 14.0–49.0)
MCH: 29 pg (ref 27.2–33.4)
MCHC: 31.9 g/dL — AB (ref 32.0–36.0)
MCV: 90.9 fL (ref 79.3–98.0)
MONO#: 0.9 10*3/uL (ref 0.1–0.9)
MONO%: 15.2 % — ABNORMAL HIGH (ref 0.0–14.0)
NEUT#: 3.4 10*3/uL (ref 1.5–6.5)
NEUT%: 57.3 % (ref 39.0–75.0)
Platelets: 186 10*3/uL (ref 140–400)
RBC: 2.97 10*6/uL — ABNORMAL LOW (ref 4.20–5.82)
RDW: 26 % — ABNORMAL HIGH (ref 11.0–14.6)
WBC: 5.9 10*3/uL (ref 4.0–10.3)
lymph#: 1.5 10*3/uL (ref 0.9–3.3)
nRBC: 4 % — ABNORMAL HIGH (ref 0–0)

## 2013-07-01 LAB — COMPREHENSIVE METABOLIC PANEL (CC13)
ALT: 21 U/L (ref 0–55)
ANION GAP: 9 meq/L (ref 3–11)
AST: 24 U/L (ref 5–34)
Albumin: 3.8 g/dL (ref 3.5–5.0)
Alkaline Phosphatase: 74 U/L (ref 40–150)
BUN: 38.2 mg/dL — ABNORMAL HIGH (ref 7.0–26.0)
CO2: 25 mEq/L (ref 22–29)
CREATININE: 1.4 mg/dL — AB (ref 0.7–1.3)
Calcium: 9.1 mg/dL (ref 8.4–10.4)
Chloride: 108 mEq/L (ref 98–109)
Glucose: 93 mg/dl (ref 70–140)
Potassium: 4.7 mEq/L (ref 3.5–5.1)
Sodium: 142 mEq/L (ref 136–145)
Total Bilirubin: 0.48 mg/dL (ref 0.20–1.20)
Total Protein: 6.6 g/dL (ref 6.4–8.3)

## 2013-07-08 ENCOUNTER — Telehealth: Payer: Self-pay | Admitting: Internal Medicine

## 2013-07-08 NOTE — Telephone Encounter (Signed)
pt called to r/s appt....done...pt awar of new d.t

## 2013-07-15 ENCOUNTER — Other Ambulatory Visit: Payer: Medicare Other

## 2013-07-15 ENCOUNTER — Ambulatory Visit: Payer: Medicare Other | Admitting: Internal Medicine

## 2013-07-23 ENCOUNTER — Other Ambulatory Visit: Payer: Self-pay | Admitting: *Deleted

## 2013-07-23 DIAGNOSIS — D473 Essential (hemorrhagic) thrombocythemia: Secondary | ICD-10-CM

## 2013-07-27 ENCOUNTER — Ambulatory Visit (HOSPITAL_COMMUNITY)
Admission: RE | Admit: 2013-07-27 | Discharge: 2013-07-27 | Disposition: A | Discharge status: Home / Self Care | Payer: Medicare Other | Source: Ambulatory Visit | Attending: Internal Medicine | Admitting: Internal Medicine

## 2013-07-27 ENCOUNTER — Ambulatory Visit (HOSPITAL_BASED_OUTPATIENT_CLINIC_OR_DEPARTMENT_OTHER): Payer: Medicare Other | Admitting: Internal Medicine

## 2013-07-27 ENCOUNTER — Other Ambulatory Visit (HOSPITAL_BASED_OUTPATIENT_CLINIC_OR_DEPARTMENT_OTHER): Payer: Medicare Other

## 2013-07-27 ENCOUNTER — Telehealth: Payer: Self-pay | Admitting: *Deleted

## 2013-07-27 ENCOUNTER — Encounter: Payer: Self-pay | Admitting: Internal Medicine

## 2013-07-27 ENCOUNTER — Other Ambulatory Visit: Payer: Self-pay | Admitting: *Deleted

## 2013-07-27 VITALS — BP 120/59 | HR 72 | Temp 97.9°F | Resp 18 | Ht 69.0 in | Wt 146.0 lb

## 2013-07-27 DIAGNOSIS — D649 Anemia, unspecified: Secondary | ICD-10-CM | POA: Insufficient documentation

## 2013-07-27 DIAGNOSIS — D7581 Myelofibrosis: Secondary | ICD-10-CM

## 2013-07-27 DIAGNOSIS — D473 Essential (hemorrhagic) thrombocythemia: Secondary | ICD-10-CM

## 2013-07-27 LAB — HOLD TUBE, BLOOD BANK

## 2013-07-27 LAB — CBC WITH DIFFERENTIAL/PLATELET
BASO%: 1.7 % (ref 0.0–2.0)
Basophils Absolute: 0.1 10*3/uL (ref 0.0–0.1)
EOS%: 0.3 % (ref 0.0–7.0)
Eosinophils Absolute: 0 10*3/uL (ref 0.0–0.5)
HEMATOCRIT: 22.5 % — AB (ref 38.4–49.9)
HGB: 7.1 g/dL — ABNORMAL LOW (ref 13.0–17.1)
LYMPH#: 1.7 10*3/uL (ref 0.9–3.3)
LYMPH%: 29.7 % (ref 14.0–49.0)
MCH: 28.4 pg (ref 27.2–33.4)
MCHC: 31.6 g/dL — AB (ref 32.0–36.0)
MCV: 90 fL (ref 79.3–98.0)
MONO#: 0.9 10*3/uL (ref 0.1–0.9)
MONO%: 15.6 % — ABNORMAL HIGH (ref 0.0–14.0)
NEUT#: 3 10*3/uL (ref 1.5–6.5)
NEUT%: 52.7 % (ref 39.0–75.0)
NRBC: 7 % — AB (ref 0–0)
Platelets: 379 10*3/uL (ref 140–400)
RBC: 2.5 10*6/uL — AB (ref 4.20–5.82)
RDW: 26 % — ABNORMAL HIGH (ref 11.0–14.6)
WBC: 5.8 10*3/uL (ref 4.0–10.3)

## 2013-07-27 LAB — COMPREHENSIVE METABOLIC PANEL (CC13)
ALK PHOS: 64 U/L (ref 40–150)
ALT: 21 U/L (ref 0–55)
AST: 25 U/L (ref 5–34)
Albumin: 3.8 g/dL (ref 3.5–5.0)
Anion Gap: 7 mEq/L (ref 3–11)
BUN: 40.7 mg/dL — ABNORMAL HIGH (ref 7.0–26.0)
CO2: 26 mEq/L (ref 22–29)
CREATININE: 1.6 mg/dL — AB (ref 0.7–1.3)
Calcium: 8.7 mg/dL (ref 8.4–10.4)
Chloride: 108 mEq/L (ref 98–109)
Glucose: 82 mg/dl (ref 70–140)
Potassium: 4.3 mEq/L (ref 3.5–5.1)
Sodium: 142 mEq/L (ref 136–145)
Total Bilirubin: 0.39 mg/dL (ref 0.20–1.20)
Total Protein: 6.4 g/dL (ref 6.4–8.3)

## 2013-07-27 LAB — TECHNOLOGIST REVIEW

## 2013-07-27 NOTE — Telephone Encounter (Signed)
Per desk RN I have scheduled treatment appt for 6/18. Desk RN to give patietn appt

## 2013-07-27 NOTE — Progress Notes (Signed)
[place] Telephone:(336) [phone]   Fax:(336) [phone]  OFFICE PROGRESS NOTE  Renato Shin, MD [address] [place] [address]  DIAGNOSIS: Essential thrombocythemia, now with myelofibrosis based on a bone marrow biopsy and aspirate performed at [facility] in April 2015.   PRIOR THERAPY:  1) Hydroxyurea 500 mg by mouth twice a day discontinued April 2015.  2) anagrelide 2.5 mg by mouth twice a day discontinued April 2015.   CURRENT THERAPY:  1) Jakafi 20 mg by mouth twice a day started April 2050  2) Integra plus 1 capsule by mouth daily for iron deficiency.  3) aspirin 81 mg by mouth daily.    INTERVAL HISTORY: Thomas Cherry 68 y.o. male returns to the clinic today for follow up visit. The patient is feeling fine today except for mild fatigue. He is currently on treatment with Jakafi and tolerating it fairly well. He already has a matched donor and expected to have allogeneic stem cell transplant middle of July 2015. He denied having any significant chest pain, or shortness of breath with exertion. He denied having any bleeding issues. The patient has repeat CBC and comprehensive metabolic panel performed earlier today and he is here for evaluation and discussion of his lab results.  MEDICAL HISTORY: Past Medical History  Diagnosis Date  . GERD (gastroesophageal reflux disease)   . Allergy     heyfever  . Hypertension   . Palpitation   . Elevated platelet count     on hydroxyurea  . Heart murmur     ALLERGIES:  has No Known Allergies.  MEDICATIONS:  Current Outpatient Prescriptions  Medication Sig Dispense Refill  . aspirin 81 MG tablet Take 81 mg by mouth daily.        Marland Kitchen diltiazem (CARDIZEM CD) 180 MG 24 hr capsule TAKE (1) CAPSULE DAILY BY MOUTH  90 capsule  3  . esomeprazole (NEXIUM) 40 MG capsule TAKE ONE CAPSULE BY MOUTH EVERY OTHER DAY BEFORE BREAKFAST      . folic acid (FOLVITE) 1 MG tablet Take 1 mg by mouth daily.       Marland Kitchen losartan-hydrochlorothiazide (HYZAAR) 100-25 MG per tablet TAKE (1/2) TABLET DAILY  45 tablet  3  . losartan-hydrochlorothiazide (HYZAAR) 50-12.5 MG per tablet TAKE 1/2 TABLET ONCE DAILY  45 tablet  2  . ruxolitinib phosphate (JAKAFI) 20 MG tablet 20 mg.       No current facility-administered medications for this visit.    SURGICAL HISTORY:  Past Surgical History  Procedure Laterality Date  . Hernia repair      REVIEW OF SYSTEMS:  Constitutional: positive for fatigue Eyes: negative Ears, nose, mouth, throat, and face: negative Respiratory: positive for dyspnea on exertion Cardiovascular: negative Gastrointestinal: negative Genitourinary:negative Integument/breast: negative Hematologic/lymphatic: negative Musculoskeletal:negative Neurological: positive for dizziness Behavioral/Psych: negative Endocrine: negative Allergic/Immunologic: negative   PHYSICAL EXAMINATION: General appearance: alert, cooperative, fatigued and no distress Head: Normocephalic, without obvious abnormality, atraumatic Neck: no adenopathy, no JVD, supple, symmetrical, trachea midline and thyroid not enlarged, symmetric, no tenderness/mass/nodules Lymph nodes: Cervical, supraclavicular, and axillary nodes normal. Resp: clear to auscultation bilaterally Cardio: regular rate and rhythm, S1, S2 normal, no murmur, click, rub or gallop GI: soft, non-tender; bowel sounds normal; no masses,  no organomegaly Extremities: extremities normal, atraumatic, no cyanosis or edema Neurologic: Alert and oriented X 3, normal strength and tone. Normal symmetric reflexes. Normal coordination and gait  ECOG PERFORMANCE STATUS: 1 - Symptomatic but completely  ambulatory  Blood pressure 120/59, pulse 72, temperature 97.9 F (36.6 C), temperature source Oral, resp. rate 18, height $RemoveBe'5\' 9"'ibBvlqIUC$  (1.753 m), weight 146 lb (66.225 kg).  LABORATORY DATA: Lab Results  Component Value Date   WBC 5.8 07/27/2013   HGB 7.1* 07/27/2013    HCT 22.5* 07/27/2013   MCV 90.0 07/27/2013   PLT 379 07/27/2013      Chemistry      Component Value Date/Time   NA 142 07/27/2013 1335   NA 145 04/15/2012 1142   K 4.3 07/27/2013 1335   K 4.8 04/15/2012 1142   CL 110* 07/29/2012 1132   CL 110 04/15/2012 1142   CO2 26 07/27/2013 1335   CO2 27 04/15/2012 1142   BUN 40.7* 07/27/2013 1335   BUN 27* 04/15/2012 1142   CREATININE 1.6* 07/27/2013 1335   CREATININE 1.03 04/15/2012 1142      Component Value Date/Time   CALCIUM 8.7 07/27/2013 1335   CALCIUM 8.6 04/15/2012 1142   ALKPHOS 64 07/27/2013 1335   ALKPHOS 57 04/15/2012 1142   AST 25 07/27/2013 1335   AST 28 04/15/2012 1142   ALT 21 07/27/2013 1335   ALT <5 04/15/2012 1142   BILITOT 0.39 07/27/2013 1335   BILITOT 0.3 04/15/2012 1142       RADIOGRAPHIC STUDIES: No results found.  ASSESSMENT AND PLAN: this is a very pleasant 68 years old white male with essential thrombocythemia status post treatment with anagrelide as well as hydroxyurea by the recent bone marrow biopsy showed evidence for myelofibrosis. His treatment with anagrelide and hydroxyurea was discontinued and the patient is currently on Jakafi 20 mg by mouth twice a day and tolerating it fairly well. He is currently undergoing testing for allogenic stem cell transplant which is expected in the middle of July 2015 October he was found to have a matched donor. His hemoglobin is down today to 7.1 but the patient is asymptomatic except for mild fatigue. I discussed the lab result with the patient today and am trying to limit the number of PRBCs transfusion to decrease alloimmunization before his stem cell transplant.  He will contact Dr. Clydene Laming for advice regarding the transfusion and as needed I would be happy to arrange for him to units of PRBCs transfusion in the next few days. The patient would be under the transplant team within the next few weeks and I will be happy to see him again after completion of his transplant process. I did not give him a  followup appointment at this time but he was advised to call me appointment if needed. He was advised to call me immediately if he has any concerning symptoms in the interval.  The patient voices understanding of current disease status and treatment options and is in agreement with the current care plan.  All questions were answered. The patient knows to call the clinic with any problems, questions or concerns. We can certainly see the patient much sooner if necessary.  Disclaimer: This note was dictated with voice recognition software. Similar sounding words can inadvertently be transcribed and may not be corrected upon review.

## 2013-07-28 ENCOUNTER — Telehealth: Payer: Self-pay | Admitting: *Deleted

## 2013-07-28 NOTE — Telephone Encounter (Signed)
Per Dr Vista Mink at office visit 6/15, pt needs a blood transfusion with hbg 7.1. Dr Vista Mink advised he contact Dr Madelin Rear office at [facility] to see if it is okay to get a transfusion with an upcoming transplant.  Rocio called Noreene Filbert, the RN coordinator at [facility] for Dr Clydene Laming but pt has not been able to get in touch with her.  Her Ph# is [phone].  Informed patient that we will call and leave her a msg as well and will let him know if we hear anything.  Left a msg with Noreene Filbert.  SLJ

## 2013-07-30 ENCOUNTER — Telehealth: Payer: Self-pay | Admitting: Medical Oncology

## 2013-07-30 NOTE — Telephone Encounter (Signed)
Dr Clydene Laming is okay with transfusion if infrequent. I left message for pt to return my call.

## 2013-07-31 ENCOUNTER — Ambulatory Visit (HOSPITAL_BASED_OUTPATIENT_CLINIC_OR_DEPARTMENT_OTHER): Payer: Medicare Other

## 2013-07-31 ENCOUNTER — Telehealth: Payer: Self-pay | Admitting: Medical Oncology

## 2013-07-31 ENCOUNTER — Other Ambulatory Visit: Payer: Self-pay | Admitting: Medical Oncology

## 2013-07-31 ENCOUNTER — Other Ambulatory Visit: Payer: Medicare Other

## 2013-07-31 VITALS — BP 159/72 | HR 56 | Temp 97.2°F | Resp 18

## 2013-07-31 DIAGNOSIS — D7581 Myelofibrosis: Secondary | ICD-10-CM

## 2013-07-31 DIAGNOSIS — D473 Essential (hemorrhagic) thrombocythemia: Secondary | ICD-10-CM

## 2013-07-31 LAB — PREPARE RBC (CROSSMATCH)

## 2013-07-31 MED ORDER — DIPHENHYDRAMINE HCL 25 MG PO CAPS
25.0000 mg | ORAL_CAPSULE | Freq: Once | ORAL | Status: AC
  Administered 2013-07-31: 25 mg via ORAL

## 2013-07-31 MED ORDER — ACETAMINOPHEN 325 MG PO TABS
650.0000 mg | ORAL_TABLET | Freq: Once | ORAL | Status: AC
  Administered 2013-07-31: 650 mg via ORAL

## 2013-07-31 MED ORDER — ACETAMINOPHEN 325 MG PO TABS
ORAL_TABLET | ORAL | Status: AC
  Filled 2013-07-31: qty 2

## 2013-07-31 MED ORDER — DIPHENHYDRAMINE HCL 25 MG PO CAPS
ORAL_CAPSULE | ORAL | Status: AC
  Filled 2013-07-31: qty 1

## 2013-07-31 MED ORDER — SODIUM CHLORIDE 0.9 % IV SOLN
250.0000 mL | Freq: Once | INTRAVENOUS | Status: AC
  Administered 2013-07-31: 250 mL via INTRAVENOUS

## 2013-07-31 NOTE — Telephone Encounter (Signed)
scheduled for 2 units blood today.

## 2013-07-31 NOTE — Progress Notes (Signed)
Har done 

## 2013-07-31 NOTE — Patient Instructions (Signed)

## 2013-08-01 LAB — TYPE AND SCREEN
ABO/RH(D): O POS
ANTIBODY SCREEN: NEGATIVE
UNIT DIVISION: 0
UNIT DIVISION: 0

## 2013-09-23 ENCOUNTER — Telehealth: Payer: Self-pay | Admitting: *Deleted

## 2013-09-23 NOTE — Telephone Encounter (Signed)
Suanne Marker called from [facility] stating that patient received BM transplant and will need daily IVF's. His Cr is elevated and will need scheduling  He will be going to [facility] twice a week for follow-up. She is sending orders.

## 2013-09-30 ENCOUNTER — Other Ambulatory Visit: Payer: Self-pay | Admitting: *Deleted

## 2013-09-30 ENCOUNTER — Telehealth: Payer: Self-pay | Admitting: Medical Oncology

## 2013-09-30 ENCOUNTER — Telehealth: Payer: Self-pay | Admitting: *Deleted

## 2013-09-30 DIAGNOSIS — N289 Disorder of kidney and ureter, unspecified: Secondary | ICD-10-CM

## 2013-09-30 MED ORDER — SODIUM CHLORIDE 0.9 % IV SOLN: Freq: Once | INTRAVENOUS | Status: DC

## 2013-09-30 NOTE — Telephone Encounter (Signed)
Received request from [facility], NP requesting for pt to have IVF NS over 1 hr on 10/01/13 and 10/03/13 due to renal insufficiency.  Pt scheduled for appts.  Left messages on home and cell # with appointments and asked for a call back.  SLJ

## 2013-09-30 NOTE — Telephone Encounter (Signed)
I called and left message for wife to call today and let us know how Bode is doing.

## 2013-09-30 NOTE — Telephone Encounter (Addendum)
Received call from Suanne Marker, RN post-transplant coordinator.  Per Cornell Barman, NP, pt does not need to come to [facility] for IVF 10/01/13.  He has to come back to [facility] on 10/01/13.  He may need to get fluids on 10/02/13 but she will call and let us know.  He does need to still come and get fluids on 10/03/13.  Called and spoke to pt regarding this information, he verbalized understanding.  SLJ

## 2013-09-30 NOTE — Telephone Encounter (Signed)
Message copied by Ardeen Garland on Wed Sep 30, 2013  9:01 AM ------      Message from: Renford Dills      Created: Wed Sep 23, 2013  1:53 PM       Suanne Marker called from [facility] stating that patient received BM transplant and will need daily IVF's. His Cr is elevated and will need scheduling  He will be going to [facility] twice a week for follow-up. She is sending orders.  ------

## 2013-10-01 ENCOUNTER — Ambulatory Visit: Payer: Medicare Other

## 2013-10-02 ENCOUNTER — Other Ambulatory Visit: Payer: Self-pay | Admitting: *Deleted

## 2013-10-03 ENCOUNTER — Ambulatory Visit (HOSPITAL_BASED_OUTPATIENT_CLINIC_OR_DEPARTMENT_OTHER): Payer: Medicare Other

## 2013-10-03 VITALS — BP 126/67 | HR 94 | Temp 97.6°F | Resp 18

## 2013-10-03 DIAGNOSIS — E860 Dehydration: Secondary | ICD-10-CM

## 2013-10-03 DIAGNOSIS — D473 Essential (hemorrhagic) thrombocythemia: Secondary | ICD-10-CM

## 2013-10-03 MED ORDER — SODIUM CHLORIDE 0.9 % IJ SOLN
10.0000 mL | INTRAMUSCULAR | Status: DC | PRN
  Administered 2013-10-03: 10 mL via INTRAVENOUS
  Filled 2013-10-03: qty 10

## 2013-10-03 MED ORDER — SODIUM CHLORIDE 0.9 % IV SOLN
Freq: Once | INTRAVENOUS | Status: AC
  Administered 2013-10-03: 09:00:00 via INTRAVENOUS

## 2013-10-03 MED ORDER — HEPARIN SOD (PORK) LOCK FLUSH 100 UNIT/ML IV SOLN
500.0000 [IU] | Freq: Once | INTRAVENOUS | Status: AC
  Administered 2013-10-03: 250 [IU] via INTRAVENOUS
  Filled 2013-10-03: qty 5

## 2013-10-03 NOTE — Patient Instructions (Signed)
Dehydration, Adult Dehydration is when you lose more fluids from the body than you take in. Vital organs like the kidneys, brain, and heart cannot function without a proper amount of fluids and salt. Any loss of fluids from the body can cause dehydration.  CAUSES   Vomiting.  Diarrhea.  Excessive sweating.  Excessive urine output.  Fever. SYMPTOMS  Mild dehydration  Thirst.  Dry lips.  Slightly dry mouth. Moderate dehydration  Very dry mouth.  Sunken eyes.  Skin does not bounce back quickly when lightly pinched and released.  Dark urine and decreased urine production.  Decreased tear production.  Headache. Severe dehydration  Very dry mouth.  Extreme thirst.  Rapid, weak pulse (more than 100 beats per minute at rest).  Cold hands and feet.  Not able to sweat in spite of heat and temperature.  Rapid breathing.  Blue lips.  Confusion and lethargy.  Difficulty being awakened.  Minimal urine production.  No tears. DIAGNOSIS  Your caregiver will diagnose dehydration based on your symptoms and your exam. Blood and urine tests will help confirm the diagnosis. The diagnostic evaluation should also identify the cause of dehydration. TREATMENT  Treatment of mild or moderate dehydration can often be done at home by increasing the amount of fluids that you drink. It is best to drink small amounts of fluid more often. Drinking too much at one time can make vomiting worse. Refer to the home care instructions below. Severe dehydration needs to be treated at the hospital where you will probably be given intravenous (IV) fluids that contain water and electrolytes. HOME CARE INSTRUCTIONS   Ask your caregiver about specific rehydration instructions.  Drink enough fluids to keep your urine clear or pale yellow.  Drink small amounts frequently if you have nausea and vomiting.  Eat as you normally do.  Avoid:  Foods or drinks high in sugar.  Carbonated  drinks.  Juice.  Extremely hot or cold fluids.  Drinks with caffeine.  Fatty, greasy foods.  Alcohol.  Tobacco.  Overeating.  Gelatin desserts.  Wash your hands well to avoid spreading bacteria and viruses.  Only take over-the-counter or prescription medicines for pain, discomfort, or fever as directed by your caregiver.  Ask your caregiver if you should continue all prescribed and over-the-counter medicines.  Keep all follow-up appointments with your caregiver. SEEK MEDICAL CARE IF:  You have abdominal pain and it increases or stays in one area (localizes).  You have a rash, stiff neck, or severe headache.  You are irritable, sleepy, or difficult to awaken.  You are weak, dizzy, or extremely thirsty. SEEK IMMEDIATE MEDICAL CARE IF:   You are unable to keep fluids down or you get worse despite treatment.  You have frequent episodes of vomiting or diarrhea.  You have blood or green matter (bile) in your vomit.  You have blood in your stool or your stool looks black and tarry.  You have not urinated in 6 to 8 hours, or you have only urinated a small amount of very dark urine.  You have a fever.  You faint. MAKE SURE YOU:   Understand these instructions.  Will watch your condition.  Will get help right away if you are not doing well or get worse. Document Released: 01/29/2005 Document Revised: 04/23/2011 Document Reviewed: 09/18/2010 ExitCare Patient Information 2015 [employer]. This information is not intended to replace advice given to you by your health care provider. Make sure you discuss any questions you have with your health care   provider.  

## 2013-10-05 ENCOUNTER — Other Ambulatory Visit: Payer: Self-pay | Admitting: *Deleted

## 2013-10-05 DIAGNOSIS — D473 Essential (hemorrhagic) thrombocythemia: Secondary | ICD-10-CM

## 2013-10-06 ENCOUNTER — Ambulatory Visit (HOSPITAL_BASED_OUTPATIENT_CLINIC_OR_DEPARTMENT_OTHER): Payer: Medicare Other

## 2013-10-06 VITALS — BP 100/62 | HR 103 | Temp 97.9°F | Resp 18

## 2013-10-06 DIAGNOSIS — D473 Essential (hemorrhagic) thrombocythemia: Secondary | ICD-10-CM

## 2013-10-06 DIAGNOSIS — E860 Dehydration: Secondary | ICD-10-CM

## 2013-10-06 MED ORDER — SODIUM CHLORIDE 0.9 % IV SOLN
1000.0000 mL | Freq: Once | INTRAVENOUS | Status: AC
  Administered 2013-10-06: 1000 mL via INTRAVENOUS

## 2013-10-06 MED ORDER — SODIUM CHLORIDE 0.9 % IJ SOLN
10.0000 mL | Freq: Once | INTRAMUSCULAR | Status: AC
  Administered 2013-10-06: 10 mL
  Filled 2013-10-06: qty 10

## 2013-10-06 MED ORDER — HEPARIN SOD (PORK) LOCK FLUSH 100 UNIT/ML IV SOLN
250.0000 [IU] | Freq: Once | INTRAVENOUS | Status: AC
  Administered 2013-10-06: 500 [IU]
  Filled 2013-10-06: qty 5

## 2013-10-06 NOTE — Patient Instructions (Signed)
Dehydration, Adult Dehydration is when you lose more fluids from the body than you take in. Vital organs like the kidneys, brain, and heart cannot function without a proper amount of fluids and salt. Any loss of fluids from the body can cause dehydration.  CAUSES   Vomiting.  Diarrhea.  Excessive sweating.  Excessive urine output.  Fever. SYMPTOMS  Mild dehydration  Thirst.  Dry lips.  Slightly dry mouth. Moderate dehydration  Very dry mouth.  Sunken eyes.  Skin does not bounce back quickly when lightly pinched and released.  Dark urine and decreased urine production.  Decreased tear production.  Headache. Severe dehydration  Very dry mouth.  Extreme thirst.  Rapid, weak pulse (more than 100 beats per minute at rest).  Cold hands and feet.  Not able to sweat in spite of heat and temperature.  Rapid breathing.  Blue lips.  Confusion and lethargy.  Difficulty being awakened.  Minimal urine production.  No tears. DIAGNOSIS  Your caregiver will diagnose dehydration based on your symptoms and your exam. Blood and urine tests will help confirm the diagnosis. The diagnostic evaluation should also identify the cause of dehydration. TREATMENT  Treatment of mild or moderate dehydration can often be done at home by increasing the amount of fluids that you drink. It is best to drink small amounts of fluid more often. Drinking too much at one time can make vomiting worse. Refer to the home care instructions below. Severe dehydration needs to be treated at the hospital where you will probably be given intravenous (IV) fluids that contain water and electrolytes. HOME CARE INSTRUCTIONS   Ask your caregiver about specific rehydration instructions.  Drink enough fluids to keep your urine clear or pale yellow.  Drink small amounts frequently if you have nausea and vomiting.  Eat as you normally do.  Avoid:  Foods or drinks high in sugar.  Carbonated  drinks.  Juice.  Extremely hot or cold fluids.  Drinks with caffeine.  Fatty, greasy foods.  Alcohol.  Tobacco.  Overeating.  Gelatin desserts.  Wash your hands well to avoid spreading bacteria and viruses.  Only take over-the-counter or prescription medicines for pain, discomfort, or fever as directed by your caregiver.  Ask your caregiver if you should continue all prescribed and over-the-counter medicines.  Keep all follow-up appointments with your caregiver. SEEK MEDICAL CARE IF:  You have abdominal pain and it increases or stays in one area (localizes).  You have a rash, stiff neck, or severe headache.  You are irritable, sleepy, or difficult to awaken.  You are weak, dizzy, or extremely thirsty. SEEK IMMEDIATE MEDICAL CARE IF:   You are unable to keep fluids down or you get worse despite treatment.  You have frequent episodes of vomiting or diarrhea.  You have blood or green matter (bile) in your vomit.  You have blood in your stool or your stool looks black and tarry.  You have not urinated in 6 to 8 hours, or you have only urinated a small amount of very dark urine.  You have a fever.  You faint. MAKE SURE YOU:   Understand these instructions.  Will watch your condition.  Will get help right away if you are not doing well or get worse. Document Released: 01/29/2005 Document Revised: 04/23/2011 Document Reviewed: 09/18/2010 ExitCare Patient Information 2015 [employer]. This information is not intended to replace advice given to you by your health care provider. Make sure you discuss any questions you have with your health care   provider.  

## 2013-10-07 ENCOUNTER — Ambulatory Visit (HOSPITAL_BASED_OUTPATIENT_CLINIC_OR_DEPARTMENT_OTHER): Payer: Medicare Other

## 2013-10-07 VITALS — BP 159/57 | HR 65 | Temp 98.3°F | Resp 18

## 2013-10-07 DIAGNOSIS — E860 Dehydration: Secondary | ICD-10-CM

## 2013-10-07 DIAGNOSIS — D473 Essential (hemorrhagic) thrombocythemia: Secondary | ICD-10-CM

## 2013-10-07 MED ORDER — SODIUM CHLORIDE 0.9 % IJ SOLN
10.0000 mL | INTRAMUSCULAR | Status: AC | PRN
  Administered 2013-10-07: 10 mL
  Filled 2013-10-07: qty 10

## 2013-10-07 MED ORDER — HEPARIN SOD (PORK) LOCK FLUSH 100 UNIT/ML IV SOLN
250.0000 [IU] | INTRAVENOUS | Status: AC | PRN
  Administered 2013-10-07: 250 [IU]
  Filled 2013-10-07: qty 5

## 2013-10-07 MED ORDER — SODIUM CHLORIDE 0.9 % IV SOLN
1000.0000 mL | INTRAVENOUS | Status: DC
  Administered 2013-10-07: 08:00:00 via INTRAVENOUS

## 2013-10-07 NOTE — Patient Instructions (Signed)
Dehydration, Adult Dehydration is when you lose more fluids from the body than you take in. Vital organs like the kidneys, brain, and heart cannot function without a proper amount of fluids and salt. Any loss of fluids from the body can cause dehydration.  CAUSES   Vomiting.  Diarrhea.  Excessive sweating.  Excessive urine output.  Fever. SYMPTOMS  Mild dehydration  Thirst.  Dry lips.  Slightly dry mouth. Moderate dehydration  Very dry mouth.  Sunken eyes.  Skin does not bounce back quickly when lightly pinched and released.  Dark urine and decreased urine production.  Decreased tear production.  Headache. Severe dehydration  Very dry mouth.  Extreme thirst.  Rapid, weak pulse (more than 100 beats per minute at rest).  Cold hands and feet.  Not able to sweat in spite of heat and temperature.  Rapid breathing.  Blue lips.  Confusion and lethargy.  Difficulty being awakened.  Minimal urine production.  No tears. DIAGNOSIS  Your caregiver will diagnose dehydration based on your symptoms and your exam. Blood and urine tests will help confirm the diagnosis. The diagnostic evaluation should also identify the cause of dehydration. TREATMENT  Treatment of mild or moderate dehydration can often be done at home by increasing the amount of fluids that you drink. It is best to drink small amounts of fluid more often. Drinking too much at one time can make vomiting worse. Refer to the home care instructions below. Severe dehydration needs to be treated at the hospital where you will probably be given intravenous (IV) fluids that contain water and electrolytes. HOME CARE INSTRUCTIONS   Ask your caregiver about specific rehydration instructions.  Drink enough fluids to keep your urine clear or pale yellow.  Drink small amounts frequently if you have nausea and vomiting.  Eat as you normally do.  Avoid:  Foods or drinks high in sugar.  Carbonated  drinks.  Juice.  Extremely hot or cold fluids.  Drinks with caffeine.  Fatty, greasy foods.  Alcohol.  Tobacco.  Overeating.  Gelatin desserts.  Wash your hands well to avoid spreading bacteria and viruses.  Only take over-the-counter or prescription medicines for pain, discomfort, or fever as directed by your caregiver.  Ask your caregiver if you should continue all prescribed and over-the-counter medicines.  Keep all follow-up appointments with your caregiver. SEEK MEDICAL CARE IF:  You have abdominal pain and it increases or stays in one area (localizes).  You have a rash, stiff neck, or severe headache.  You are irritable, sleepy, or difficult to awaken.  You are weak, dizzy, or extremely thirsty. SEEK IMMEDIATE MEDICAL CARE IF:   You are unable to keep fluids down or you get worse despite treatment.  You have frequent episodes of vomiting or diarrhea.  You have blood or green matter (bile) in your vomit.  You have blood in your stool or your stool looks black and tarry.  You have not urinated in 6 to 8 hours, or you have only urinated a small amount of very dark urine.  You have a fever.  You faint. MAKE SURE YOU:   Understand these instructions.  Will watch your condition.  Will get help right away if you are not doing well or get worse. Document Released: 01/29/2005 Document Revised: 04/23/2011 Document Reviewed: 09/18/2010 ExitCare Patient Information 2015 [employer]. This information is not intended to replace advice given to you by your health care provider. Make sure you discuss any questions you have with your health care   provider.  

## 2013-10-08 ENCOUNTER — Other Ambulatory Visit: Payer: Self-pay | Admitting: Medical Oncology

## 2013-10-08 ENCOUNTER — Telehealth: Payer: Self-pay | Admitting: *Deleted

## 2013-10-08 DIAGNOSIS — R7989 Other specified abnormal findings of blood chemistry: Secondary | ICD-10-CM

## 2013-10-08 MED ORDER — SODIUM CHLORIDE 0.9 % IV SOLN: Freq: Once | INTRAVENOUS | Status: DC

## 2013-10-08 MED ORDER — SODIUM CHLORIDE 0.9 % IV SOLN: INTRAVENOUS | Status: DC

## 2013-10-08 NOTE — Addendum Note (Signed)
Addended by: Gerrit Halls T on: 10/08/2013 12:55 PM   Modules accepted: Orders

## 2013-10-08 NOTE — Telephone Encounter (Signed)
Per staff message and POF I have scheduled appts. Advised scheduler of appts. JMW  

## 2013-10-09 ENCOUNTER — Ambulatory Visit (HOSPITAL_BASED_OUTPATIENT_CLINIC_OR_DEPARTMENT_OTHER): Payer: Medicare Other

## 2013-10-09 ENCOUNTER — Other Ambulatory Visit: Payer: Self-pay | Admitting: Medical Oncology

## 2013-10-09 VITALS — BP 137/69 | HR 83 | Temp 97.8°F

## 2013-10-09 DIAGNOSIS — R7989 Other specified abnormal findings of blood chemistry: Secondary | ICD-10-CM

## 2013-10-09 DIAGNOSIS — D473 Essential (hemorrhagic) thrombocythemia: Secondary | ICD-10-CM

## 2013-10-09 DIAGNOSIS — E860 Dehydration: Secondary | ICD-10-CM

## 2013-10-09 DIAGNOSIS — D471 Chronic myeloproliferative disease: Secondary | ICD-10-CM

## 2013-10-09 MED ORDER — HEPARIN SOD (PORK) LOCK FLUSH 100 UNIT/ML IV SOLN
250.0000 [IU] | INTRAVENOUS | Status: AC | PRN
  Administered 2013-10-09: 250 [IU]
  Filled 2013-10-09: qty 5

## 2013-10-09 MED ORDER — SODIUM CHLORIDE 0.9 % IV SOLN
1000.0000 mL | Freq: Once | INTRAVENOUS | Status: AC
  Administered 2013-10-09: 1000 mL via INTRAVENOUS

## 2013-10-09 MED ORDER — SODIUM CHLORIDE 0.9 % IJ SOLN
10.0000 mL | INTRAMUSCULAR | Status: AC | PRN
  Administered 2013-10-09: 10 mL
  Filled 2013-10-09: qty 10

## 2013-10-09 NOTE — Patient Instructions (Signed)
Dehydration, Adult Dehydration is when you lose more fluids from the body than you take in. Vital organs like the kidneys, brain, and heart cannot function without a proper amount of fluids and salt. Any loss of fluids from the body can cause dehydration.  CAUSES   Vomiting.  Diarrhea.  Excessive sweating.  Excessive urine output.  Fever. SYMPTOMS  Mild dehydration  Thirst.  Dry lips.  Slightly dry mouth. Moderate dehydration  Very dry mouth.  Sunken eyes.  Skin does not bounce back quickly when lightly pinched and released.  Dark urine and decreased urine production.  Decreased tear production.  Headache. Severe dehydration  Very dry mouth.  Extreme thirst.  Rapid, weak pulse (more than 100 beats per minute at rest).  Cold hands and feet.  Not able to sweat in spite of heat and temperature.  Rapid breathing.  Blue lips.  Confusion and lethargy.  Difficulty being awakened.  Minimal urine production.  No tears. DIAGNOSIS  Your caregiver will diagnose dehydration based on your symptoms and your exam. Blood and urine tests will help confirm the diagnosis. The diagnostic evaluation should also identify the cause of dehydration. TREATMENT  Treatment of mild or moderate dehydration can often be done at home by increasing the amount of fluids that you drink. It is best to drink small amounts of fluid more often. Drinking too much at one time can make vomiting worse. Refer to the home care instructions below. Severe dehydration needs to be treated at the hospital where you will probably be given intravenous (IV) fluids that contain water and electrolytes. HOME CARE INSTRUCTIONS   Ask your caregiver about specific rehydration instructions.  Drink enough fluids to keep your urine clear or pale yellow.  Drink small amounts frequently if you have nausea and vomiting.  Eat as you normally do.  Avoid:  Foods or drinks high in sugar.  Carbonated  drinks.  Juice.  Extremely hot or cold fluids.  Drinks with caffeine.  Fatty, greasy foods.  Alcohol.  Tobacco.  Overeating.  Gelatin desserts.  Wash your hands well to avoid spreading bacteria and viruses.  Only take over-the-counter or prescription medicines for pain, discomfort, or fever as directed by your caregiver.  Ask your caregiver if you should continue all prescribed and over-the-counter medicines.  Keep all follow-up appointments with your caregiver. SEEK MEDICAL CARE IF:  You have abdominal pain and it increases or stays in one area (localizes).  You have a rash, stiff neck, or severe headache.  You are irritable, sleepy, or difficult to awaken.  You are weak, dizzy, or extremely thirsty. SEEK IMMEDIATE MEDICAL CARE IF:   You are unable to keep fluids down or you get worse despite treatment.  You have frequent episodes of vomiting or diarrhea.  You have blood or green matter (bile) in your vomit.  You have blood in your stool or your stool looks black and tarry.  You have not urinated in 6 to 8 hours, or you have only urinated a small amount of very dark urine.  You have a fever.  You faint. MAKE SURE YOU:   Understand these instructions.  Will watch your condition.  Will get help right away if you are not doing well or get worse. Document Released: 01/29/2005 Document Revised: 04/23/2011 Document Reviewed: 09/18/2010 ExitCare Patient Information 2015 [employer]. This information is not intended to replace advice given to you by your health care provider. Make sure you discuss any questions you have with your health care   provider.  

## 2013-10-09 NOTE — Progress Notes (Signed)
Ordered for normal  saline over 2 hours entered for 10/10/13.

## 2013-10-10 ENCOUNTER — Ambulatory Visit (HOSPITAL_BASED_OUTPATIENT_CLINIC_OR_DEPARTMENT_OTHER): Payer: Medicare Other

## 2013-10-10 VITALS — BP 107/61 | HR 105 | Temp 97.7°F | Resp 18

## 2013-10-10 DIAGNOSIS — E860 Dehydration: Secondary | ICD-10-CM

## 2013-10-10 DIAGNOSIS — D473 Essential (hemorrhagic) thrombocythemia: Secondary | ICD-10-CM

## 2013-10-10 DIAGNOSIS — R7989 Other specified abnormal findings of blood chemistry: Secondary | ICD-10-CM

## 2013-10-10 MED ORDER — SODIUM CHLORIDE 0.9 % IV SOLN
INTRAVENOUS | Status: DC
  Administered 2013-10-10: 09:00:00 via INTRAVENOUS

## 2013-10-10 NOTE — Patient Instructions (Signed)

## 2013-10-12 ENCOUNTER — Ambulatory Visit: Payer: Medicare Other

## 2013-10-12 ENCOUNTER — Telehealth: Payer: Self-pay | Admitting: *Deleted

## 2013-10-12 ENCOUNTER — Other Ambulatory Visit: Payer: Self-pay | Admitting: *Deleted

## 2013-10-12 DIAGNOSIS — N289 Disorder of kidney and ureter, unspecified: Secondary | ICD-10-CM

## 2013-10-12 NOTE — Telephone Encounter (Deleted)
Message copied by Britt Bottom on Mon Oct 12, 2013 11:41 AM ------      Message from: WHEAT, JACQUELINE M      Created: Mon Oct 12, 2013 11:06 AM      Regarding: RE: IVF 10/13/13       Tomorrow 1045, do I need to call him      ----- Message -----         From: Anders Grant, RN         Sent: 10/12/2013  10:52 AM           To: Purcell Nails Wheat, NT      Subject: IVF 10/13/13                                               Can we schedule for IVF on 10/13/13?  1 L normal saline over 1 hr        ------

## 2013-10-12 NOTE — Telephone Encounter (Signed)
Received order from [facility] for pt to get IVF 9/1 and 9/2.  Msg left on vm with appts.  SLJ

## 2013-10-13 ENCOUNTER — Ambulatory Visit (HOSPITAL_BASED_OUTPATIENT_CLINIC_OR_DEPARTMENT_OTHER): Payer: Medicare Other

## 2013-10-13 VITALS — BP 105/59 | HR 96 | Temp 98.1°F | Resp 18

## 2013-10-13 DIAGNOSIS — N289 Disorder of kidney and ureter, unspecified: Secondary | ICD-10-CM

## 2013-10-13 MED ORDER — SODIUM CHLORIDE 0.9 % IJ SOLN
10.0000 mL | INTRAMUSCULAR | Status: DC | PRN
  Administered 2013-10-13: 10 mL via INTRAVENOUS
  Filled 2013-10-13: qty 10

## 2013-10-13 MED ORDER — HEPARIN SOD (PORK) LOCK FLUSH 100 UNIT/ML IV SOLN
500.0000 [IU] | Freq: Once | INTRAVENOUS | Status: AC
  Administered 2013-10-13: 500 [IU] via INTRAVENOUS
  Filled 2013-10-13: qty 5

## 2013-10-13 MED ORDER — SODIUM CHLORIDE 0.9 % IV SOLN
Freq: Once | INTRAVENOUS | Status: AC
  Administered 2013-10-13: 11:00:00 via INTRAVENOUS

## 2013-10-13 NOTE — Patient Instructions (Signed)
Dehydration, Adult Dehydration is when you lose more fluids from the body than you take in. Vital organs like the kidneys, brain, and heart cannot function without a proper amount of fluids and salt. Any loss of fluids from the body can cause dehydration.  CAUSES   Vomiting.  Diarrhea.  Excessive sweating.  Excessive urine output.  Fever. SYMPTOMS  Mild dehydration  Thirst.  Dry lips.  Slightly dry mouth. Moderate dehydration  Very dry mouth.  Sunken eyes.  Skin does not bounce back quickly when lightly pinched and released.  Dark urine and decreased urine production.  Decreased tear production.  Headache. Severe dehydration  Very dry mouth.  Extreme thirst.  Rapid, weak pulse (more than 100 beats per minute at rest).  Cold hands and feet.  Not able to sweat in spite of heat and temperature.  Rapid breathing.  Blue lips.  Confusion and lethargy.  Difficulty being awakened.  Minimal urine production.  No tears. DIAGNOSIS  Your caregiver will diagnose dehydration based on your symptoms and your exam. Blood and urine tests will help confirm the diagnosis. The diagnostic evaluation should also identify the cause of dehydration. TREATMENT  Treatment of mild or moderate dehydration can often be done at home by increasing the amount of fluids that you drink. It is best to drink small amounts of fluid more often. Drinking too much at one time can make vomiting worse. Refer to the home care instructions below. Severe dehydration needs to be treated at the hospital where you will probably be given intravenous (IV) fluids that contain water and electrolytes. HOME CARE INSTRUCTIONS   Ask your caregiver about specific rehydration instructions.  Drink enough fluids to keep your urine clear or pale yellow.  Drink small amounts frequently if you have nausea and vomiting.  Eat as you normally do.  Avoid:  Foods or drinks high in sugar.  Carbonated  drinks.  Juice.  Extremely hot or cold fluids.  Drinks with caffeine.  Fatty, greasy foods.  Alcohol.  Tobacco.  Overeating.  Gelatin desserts.  Wash your hands well to avoid spreading bacteria and viruses.  Only take over-the-counter or prescription medicines for pain, discomfort, or fever as directed by your caregiver.  Ask your caregiver if you should continue all prescribed and over-the-counter medicines.  Keep all follow-up appointments with your caregiver. SEEK MEDICAL CARE IF:  You have abdominal pain and it increases or stays in one area (localizes).  You have a rash, stiff neck, or severe headache.  You are irritable, sleepy, or difficult to awaken.  You are weak, dizzy, or extremely thirsty. SEEK IMMEDIATE MEDICAL CARE IF:   You are unable to keep fluids down or you get worse despite treatment.  You have frequent episodes of vomiting or diarrhea.  You have blood or green matter (bile) in your vomit.  You have blood in your stool or your stool looks black and tarry.  You have not urinated in 6 to 8 hours, or you have only urinated a small amount of very dark urine.  You have a fever.  You faint. MAKE SURE YOU:   Understand these instructions.  Will watch your condition.  Will get help right away if you are not doing well or get worse. Document Released: 01/29/2005 Document Revised: 04/23/2011 Document Reviewed: 09/18/2010 ExitCare Patient Information 2015 [employer]. This information is not intended to replace advice given to you by your health care provider. Make sure you discuss any questions you have with your health care   provider.  

## 2013-10-14 ENCOUNTER — Ambulatory Visit (HOSPITAL_BASED_OUTPATIENT_CLINIC_OR_DEPARTMENT_OTHER): Payer: Medicare Other

## 2013-10-14 VITALS — BP 131/66 | HR 79 | Temp 98.3°F | Resp 16

## 2013-10-14 DIAGNOSIS — N289 Disorder of kidney and ureter, unspecified: Secondary | ICD-10-CM

## 2013-10-14 DIAGNOSIS — E860 Dehydration: Secondary | ICD-10-CM

## 2013-10-14 MED ORDER — SODIUM CHLORIDE 0.9 % IJ SOLN
10.0000 mL | INTRAMUSCULAR | Status: DC | PRN
  Administered 2013-10-14: 10 mL via INTRAVENOUS
  Filled 2013-10-14: qty 10

## 2013-10-14 MED ORDER — SODIUM CHLORIDE 0.9 % IV SOLN: Freq: Once | INTRAVENOUS | Status: DC

## 2013-10-14 MED ORDER — HEPARIN SOD (PORK) LOCK FLUSH 100 UNIT/ML IV SOLN
500.0000 [IU] | Freq: Once | INTRAVENOUS | Status: AC
  Administered 2013-10-14: 250 [IU] via INTRAVENOUS
  Filled 2013-10-14: qty 5

## 2013-10-14 MED ORDER — INFLUENZA VAC SPLIT QUAD 0.5 ML IM SUSY
0.5000 mL | PREFILLED_SYRINGE | Freq: Once | INTRAMUSCULAR | Status: DC
  Filled 2013-10-14: qty 0.5

## 2013-10-14 MED ORDER — SODIUM CHLORIDE 0.9 % IV SOLN
Freq: Once | INTRAVENOUS | Status: AC
  Administered 2013-10-14: 14:00:00 via INTRAVENOUS

## 2013-10-14 NOTE — Patient Instructions (Signed)
Dehydration, Adult Dehydration is when you lose more fluids from the body than you take in. Vital organs like the kidneys, brain, and heart cannot function without a proper amount of fluids and salt. Any loss of fluids from the body can cause dehydration.  CAUSES   Vomiting.  Diarrhea.  Excessive sweating.  Excessive urine output.  Fever. SYMPTOMS  Mild dehydration  Thirst.  Dry lips.  Slightly dry mouth. Moderate dehydration  Very dry mouth.  Sunken eyes.  Skin does not bounce back quickly when lightly pinched and released.  Dark urine and decreased urine production.  Decreased tear production.  Headache. Severe dehydration  Very dry mouth.  Extreme thirst.  Rapid, weak pulse (more than 100 beats per minute at rest).  Cold hands and feet.  Not able to sweat in spite of heat and temperature.  Rapid breathing.  Blue lips.  Confusion and lethargy.  Difficulty being awakened.  Minimal urine production.  No tears. DIAGNOSIS  Your caregiver will diagnose dehydration based on your symptoms and your exam. Blood and urine tests will help confirm the diagnosis. The diagnostic evaluation should also identify the cause of dehydration. TREATMENT  Treatment of mild or moderate dehydration can often be done at home by increasing the amount of fluids that you drink. It is best to drink small amounts of fluid more often. Drinking too much at one time can make vomiting worse. Refer to the home care instructions below. Severe dehydration needs to be treated at the hospital where you will probably be given intravenous (IV) fluids that contain water and electrolytes. HOME CARE INSTRUCTIONS   Ask your caregiver about specific rehydration instructions.  Drink enough fluids to keep your urine clear or pale yellow.  Drink small amounts frequently if you have nausea and vomiting.  Eat as you normally do.  Avoid:  Foods or drinks high in sugar.  Carbonated  drinks.  Juice.  Extremely hot or cold fluids.  Drinks with caffeine.  Fatty, greasy foods.  Alcohol.  Tobacco.  Overeating.  Gelatin desserts.  Wash your hands well to avoid spreading bacteria and viruses.  Only take over-the-counter or prescription medicines for pain, discomfort, or fever as directed by your caregiver.  Ask your caregiver if you should continue all prescribed and over-the-counter medicines.  Keep all follow-up appointments with your caregiver. SEEK MEDICAL CARE IF:  You have abdominal pain and it increases or stays in one area (localizes).  You have a rash, stiff neck, or severe headache.  You are irritable, sleepy, or difficult to awaken.  You are weak, dizzy, or extremely thirsty. SEEK IMMEDIATE MEDICAL CARE IF:   You are unable to keep fluids down or you get worse despite treatment.  You have frequent episodes of vomiting or diarrhea.  You have blood or green matter (bile) in your vomit.  You have blood in your stool or your stool looks black and tarry.  You have not urinated in 6 to 8 hours, or you have only urinated a small amount of very dark urine.  You have a fever.  You faint. MAKE SURE YOU:   Understand these instructions.  Will watch your condition.  Will get help right away if you are not doing well or get worse. Document Released: 01/29/2005 Document Revised: 04/23/2011 Document Reviewed: 09/18/2010 ExitCare Patient Information 2015 [employer]. This information is not intended to replace advice given to you by your health care provider. Make sure you discuss any questions you have with your health care   provider.  

## 2013-10-14 NOTE — Progress Notes (Signed)
Pt unable to have vaccines at this time due to transplant. Will follow up with providersat at a later date

## 2013-10-15 ENCOUNTER — Other Ambulatory Visit: Payer: Self-pay | Admitting: Medical Oncology

## 2013-10-15 ENCOUNTER — Telehealth: Payer: Self-pay | Admitting: *Deleted

## 2013-10-15 ENCOUNTER — Telehealth: Payer: Self-pay | Admitting: Internal Medicine

## 2013-10-15 ENCOUNTER — Telehealth: Payer: Self-pay | Admitting: Medical Oncology

## 2013-10-15 ENCOUNTER — Other Ambulatory Visit: Payer: Self-pay | Admitting: Internal Medicine

## 2013-10-15 DIAGNOSIS — R7989 Other specified abnormal findings of blood chemistry: Secondary | ICD-10-CM

## 2013-10-15 NOTE — Telephone Encounter (Signed)
[facility] requests cmp tomorrow and 2 days of IVF-tomorrow and Saturday. Dr Julien Nordmann notified for orders and onc tx request sent.

## 2013-10-15 NOTE — Telephone Encounter (Signed)
Per staff message and POF I have scheduled appts. Advised scheduler of appts. Advised scheduler no earlier appts JMW

## 2013-10-15 NOTE — Telephone Encounter (Addendum)
Per 09/03 POF, added labs,sent msg to add IVF for 09/04 & 09/05 and to call pt with times on both....KJ, cld pt bk lft msg confirming labs/IVF for 09/04 & 09/05, advised pt to call bck to confirm apt times and if he had any questions.Marland Kitchen..KJ

## 2013-10-16 ENCOUNTER — Other Ambulatory Visit (HOSPITAL_BASED_OUTPATIENT_CLINIC_OR_DEPARTMENT_OTHER): Payer: Medicare Other

## 2013-10-16 ENCOUNTER — Ambulatory Visit (HOSPITAL_BASED_OUTPATIENT_CLINIC_OR_DEPARTMENT_OTHER): Payer: Medicare Other

## 2013-10-16 ENCOUNTER — Other Ambulatory Visit: Payer: Self-pay | Admitting: *Deleted

## 2013-10-16 VITALS — BP 108/58 | HR 95 | Temp 97.7°F | Resp 16

## 2013-10-16 DIAGNOSIS — N289 Disorder of kidney and ureter, unspecified: Secondary | ICD-10-CM

## 2013-10-16 DIAGNOSIS — E860 Dehydration: Secondary | ICD-10-CM

## 2013-10-16 DIAGNOSIS — R7989 Other specified abnormal findings of blood chemistry: Secondary | ICD-10-CM

## 2013-10-16 DIAGNOSIS — D7581 Myelofibrosis: Secondary | ICD-10-CM

## 2013-10-16 LAB — COMPREHENSIVE METABOLIC PANEL (CC13)
ALK PHOS: 76 U/L (ref 40–150)
ALT: 10 U/L (ref 0–55)
ANION GAP: 9 meq/L (ref 3–11)
AST: 15 U/L (ref 5–34)
Albumin: 3.4 g/dL — ABNORMAL LOW (ref 3.5–5.0)
BILIRUBIN TOTAL: 0.39 mg/dL (ref 0.20–1.20)
BUN: 28.8 mg/dL — ABNORMAL HIGH (ref 7.0–26.0)
CO2: 22 meq/L (ref 22–29)
Calcium: 9.1 mg/dL (ref 8.4–10.4)
Chloride: 109 mEq/L (ref 98–109)
Creatinine: 1.9 mg/dL — ABNORMAL HIGH (ref 0.7–1.3)
Glucose: 116 mg/dl (ref 70–140)
Potassium: 4.1 mEq/L (ref 3.5–5.1)
Sodium: 141 mEq/L (ref 136–145)
Total Protein: 6.2 g/dL — ABNORMAL LOW (ref 6.4–8.3)

## 2013-10-16 MED ORDER — HEPARIN SOD (PORK) LOCK FLUSH 100 UNIT/ML IV SOLN
250.0000 [IU] | Freq: Once | INTRAVENOUS | Status: AC | PRN
  Administered 2013-10-16: 250 [IU]
  Filled 2013-10-16: qty 5

## 2013-10-16 MED ORDER — SODIUM CHLORIDE 0.9 % IV SOLN
Freq: Once | INTRAVENOUS | Status: AC
  Administered 2013-10-16: 13:00:00 via INTRAVENOUS

## 2013-10-16 MED ORDER — SODIUM CHLORIDE 0.9 % IJ SOLN
10.0000 mL | INTRAMUSCULAR | Status: DC | PRN
  Administered 2013-10-16: 10 mL
  Filled 2013-10-16: qty 10

## 2013-10-16 NOTE — Patient Instructions (Signed)
Dehydration, Adult Dehydration is when you lose more fluids from the body than you take in. Vital organs like the kidneys, brain, and heart cannot function without a proper amount of fluids and salt. Any loss of fluids from the body can cause dehydration.  CAUSES   Vomiting.  Diarrhea.  Excessive sweating.  Excessive urine output.  Fever. SYMPTOMS  Mild dehydration  Thirst.  Dry lips.  Slightly dry mouth. Moderate dehydration  Very dry mouth.  Sunken eyes.  Skin does not bounce back quickly when lightly pinched and released.  Dark urine and decreased urine production.  Decreased tear production.  Headache. Severe dehydration  Very dry mouth.  Extreme thirst.  Rapid, weak pulse (more than 100 beats per minute at rest).  Cold hands and feet.  Not able to sweat in spite of heat and temperature.  Rapid breathing.  Blue lips.  Confusion and lethargy.  Difficulty being awakened.  Minimal urine production.  No tears. DIAGNOSIS  Your caregiver will diagnose dehydration based on your symptoms and your exam. Blood and urine tests will help confirm the diagnosis. The diagnostic evaluation should also identify the cause of dehydration. TREATMENT  Treatment of mild or moderate dehydration can often be done at home by increasing the amount of fluids that you drink. It is best to drink small amounts of fluid more often. Drinking too much at one time can make vomiting worse. Refer to the home care instructions below. Severe dehydration needs to be treated at the hospital where you will probably be given intravenous (IV) fluids that contain water and electrolytes. HOME CARE INSTRUCTIONS   Ask your caregiver about specific rehydration instructions.  Drink enough fluids to keep your urine clear or pale yellow.  Drink small amounts frequently if you have nausea and vomiting.  Eat as you normally do.  Avoid:  Foods or drinks high in sugar.  Carbonated  drinks.  Juice.  Extremely hot or cold fluids.  Drinks with caffeine.  Fatty, greasy foods.  Alcohol.  Tobacco.  Overeating.  Gelatin desserts.  Wash your hands well to avoid spreading bacteria and viruses.  Only take over-the-counter or prescription medicines for pain, discomfort, or fever as directed by your caregiver.  Ask your caregiver if you should continue all prescribed and over-the-counter medicines.  Keep all follow-up appointments with your caregiver. SEEK MEDICAL CARE IF:  You have abdominal pain and it increases or stays in one area (localizes).  You have a rash, stiff neck, or severe headache.  You are irritable, sleepy, or difficult to awaken.  You are weak, dizzy, or extremely thirsty. SEEK IMMEDIATE MEDICAL CARE IF:   You are unable to keep fluids down or you get worse despite treatment.  You have frequent episodes of vomiting or diarrhea.  You have blood or green matter (bile) in your vomit.  You have blood in your stool or your stool looks black and tarry.  You have not urinated in 6 to 8 hours, or you have only urinated a small amount of very dark urine.  You have a fever.  You faint. MAKE SURE YOU:   Understand these instructions.  Will watch your condition.  Will get help right away if you are not doing well or get worse. Document Released: 01/29/2005 Document Revised: 04/23/2011 Document Reviewed: 09/18/2010 ExitCare Patient Information 2015 [employer]. This information is not intended to replace advice given to you by your health care provider. Make sure you discuss any questions you have with your health care   provider.  

## 2013-10-17 ENCOUNTER — Ambulatory Visit (HOSPITAL_BASED_OUTPATIENT_CLINIC_OR_DEPARTMENT_OTHER): Payer: Medicare Other

## 2013-10-17 VITALS — BP 152/65 | HR 67 | Temp 97.3°F | Resp 18

## 2013-10-17 DIAGNOSIS — D7581 Myelofibrosis: Secondary | ICD-10-CM

## 2013-10-17 DIAGNOSIS — E860 Dehydration: Secondary | ICD-10-CM

## 2013-10-17 DIAGNOSIS — N289 Disorder of kidney and ureter, unspecified: Secondary | ICD-10-CM

## 2013-10-17 MED ORDER — SODIUM CHLORIDE 0.9 % IJ SOLN
10.0000 mL | INTRAMUSCULAR | Status: DC | PRN
  Administered 2013-10-17: 10 mL
  Filled 2013-10-17: qty 10

## 2013-10-17 MED ORDER — HEPARIN SOD (PORK) LOCK FLUSH 100 UNIT/ML IV SOLN
250.0000 [IU] | Freq: Once | INTRAVENOUS | Status: AC | PRN
  Administered 2013-10-17: 250 [IU]
  Filled 2013-10-17: qty 5

## 2013-10-17 MED ORDER — SODIUM CHLORIDE 0.9 % IV SOLN
Freq: Once | INTRAVENOUS | Status: AC
  Administered 2013-10-17: 09:00:00 via INTRAVENOUS

## 2013-10-17 NOTE — Patient Instructions (Signed)
Dehydration, Adult Dehydration is when you lose more fluids from the body than you take in. Vital organs like the kidneys, brain, and heart cannot function without a proper amount of fluids and salt. Any loss of fluids from the body can cause dehydration.  CAUSES   Vomiting.  Diarrhea.  Excessive sweating.  Excessive urine output.  Fever. SYMPTOMS  Mild dehydration  Thirst.  Dry lips.  Slightly dry mouth. Moderate dehydration  Very dry mouth.  Sunken eyes.  Skin does not bounce back quickly when lightly pinched and released.  Dark urine and decreased urine production.  Decreased tear production.  Headache. Severe dehydration  Very dry mouth.  Extreme thirst.  Rapid, weak pulse (more than 100 beats per minute at rest).  Cold hands and feet.  Not able to sweat in spite of heat and temperature.  Rapid breathing.  Blue lips.  Confusion and lethargy.  Difficulty being awakened.  Minimal urine production.  No tears. DIAGNOSIS  Your caregiver will diagnose dehydration based on your symptoms and your exam. Blood and urine tests will help confirm the diagnosis. The diagnostic evaluation should also identify the cause of dehydration. TREATMENT  Treatment of mild or moderate dehydration can often be done at home by increasing the amount of fluids that you drink. It is best to drink small amounts of fluid more often. Drinking too much at one time can make vomiting worse. Refer to the home care instructions below. Severe dehydration needs to be treated at the hospital where you will probably be given intravenous (IV) fluids that contain water and electrolytes. HOME CARE INSTRUCTIONS   Ask your caregiver about specific rehydration instructions.  Drink enough fluids to keep your urine clear or pale yellow.  Drink small amounts frequently if you have nausea and vomiting.  Eat as you normally do.  Avoid:  Foods or drinks high in sugar.  Carbonated  drinks.  Juice.  Extremely hot or cold fluids.  Drinks with caffeine.  Fatty, greasy foods.  Alcohol.  Tobacco.  Overeating.  Gelatin desserts.  Wash your hands well to avoid spreading bacteria and viruses.  Only take over-the-counter or prescription medicines for pain, discomfort, or fever as directed by your caregiver.  Ask your caregiver if you should continue all prescribed and over-the-counter medicines.  Keep all follow-up appointments with your caregiver. SEEK MEDICAL CARE IF:  You have abdominal pain and it increases or stays in one area (localizes).  You have a rash, stiff neck, or severe headache.  You are irritable, sleepy, or difficult to awaken.  You are weak, dizzy, or extremely thirsty. SEEK IMMEDIATE MEDICAL CARE IF:   You are unable to keep fluids down or you get worse despite treatment.  You have frequent episodes of vomiting or diarrhea.  You have blood or green matter (bile) in your vomit.  You have blood in your stool or your stool looks black and tarry.  You have not urinated in 6 to 8 hours, or you have only urinated a small amount of very dark urine.  You have a fever.  You faint. MAKE SURE YOU:   Understand these instructions.  Will watch your condition.  Will get help right away if you are not doing well or get worse. Document Released: 01/29/2005 Document Revised: 04/23/2011 Document Reviewed: 09/18/2010 ExitCare Patient Information 2015 [employer]. This information is not intended to replace advice given to you by your health care provider. Make sure you discuss any questions you have with your health care   provider.  

## 2013-10-17 NOTE — Progress Notes (Signed)
Order for IVF from [facility] stated fluid could be run over an hour.  Rate of fluids will run over approx 1.5 hours.  Okay per [facility].  SLJ

## 2013-10-20 ENCOUNTER — Telehealth: Payer: Self-pay | Admitting: Internal Medicine

## 2013-10-20 ENCOUNTER — Other Ambulatory Visit: Payer: Self-pay | Admitting: Medical Oncology

## 2013-10-20 ENCOUNTER — Telehealth: Payer: Self-pay | Admitting: *Deleted

## 2013-10-20 DIAGNOSIS — D473 Essential (hemorrhagic) thrombocythemia: Secondary | ICD-10-CM

## 2013-10-20 DIAGNOSIS — Z9481 Bone marrow transplant status: Secondary | ICD-10-CM

## 2013-10-20 NOTE — Progress Notes (Signed)
Recent labs faxed to [facility]

## 2013-10-20 NOTE — Telephone Encounter (Signed)
Per staff message and POF I have scheduled appts. Advised scheduler of appts. JMW  

## 2013-10-20 NOTE — Telephone Encounter (Signed)
lvm for pt regarding to 9.9. appt.Marland KitchenMarland Kitchen

## 2013-10-21 ENCOUNTER — Ambulatory Visit (HOSPITAL_BASED_OUTPATIENT_CLINIC_OR_DEPARTMENT_OTHER): Payer: Medicare Other

## 2013-10-21 VITALS — BP 149/68 | HR 69 | Temp 97.6°F | Resp 18

## 2013-10-21 DIAGNOSIS — D473 Essential (hemorrhagic) thrombocythemia: Secondary | ICD-10-CM

## 2013-10-21 DIAGNOSIS — Z9481 Bone marrow transplant status: Secondary | ICD-10-CM

## 2013-10-21 DIAGNOSIS — E860 Dehydration: Secondary | ICD-10-CM

## 2013-10-21 MED ORDER — SODIUM CHLORIDE 0.9 % IJ SOLN
10.0000 mL | INTRAMUSCULAR | Status: AC | PRN
  Administered 2013-10-21: 10 mL
  Filled 2013-10-21: qty 10

## 2013-10-21 MED ORDER — SODIUM CHLORIDE 0.9 % IV SOLN
Freq: Once | INTRAVENOUS | Status: AC
  Administered 2013-10-21: 15:00:00 via INTRAVENOUS

## 2013-10-21 MED ORDER — HEPARIN SOD (PORK) LOCK FLUSH 100 UNIT/ML IV SOLN
250.0000 [IU] | INTRAVENOUS | Status: AC | PRN
  Administered 2013-10-21: 250 [IU]
  Filled 2013-10-21: qty 5

## 2013-10-21 NOTE — Patient Instructions (Signed)
Dehydration, Adult Dehydration is when you lose more fluids from the body than you take in. Vital organs like the kidneys, brain, and heart cannot function without a proper amount of fluids and salt. Any loss of fluids from the body can cause dehydration.  CAUSES   Vomiting.  Diarrhea.  Excessive sweating.  Excessive urine output.  Fever. SYMPTOMS  Mild dehydration  Thirst.  Dry lips.  Slightly dry mouth. Moderate dehydration  Very dry mouth.  Sunken eyes.  Skin does not bounce back quickly when lightly pinched and released.  Dark urine and decreased urine production.  Decreased tear production.  Headache. Severe dehydration  Very dry mouth.  Extreme thirst.  Rapid, weak pulse (more than 100 beats per minute at rest).  Cold hands and feet.  Not able to sweat in spite of heat and temperature.  Rapid breathing.  Blue lips.  Confusion and lethargy.  Difficulty being awakened.  Minimal urine production.  No tears. DIAGNOSIS  Your caregiver will diagnose dehydration based on your symptoms and your exam. Blood and urine tests will help confirm the diagnosis. The diagnostic evaluation should also identify the cause of dehydration. TREATMENT  Treatment of mild or moderate dehydration can often be done at home by increasing the amount of fluids that you drink. It is best to drink small amounts of fluid more often. Drinking too much at one time can make vomiting worse. Refer to the home care instructions below. Severe dehydration needs to be treated at the hospital where you will probably be given intravenous (IV) fluids that contain water and electrolytes. HOME CARE INSTRUCTIONS   Ask your caregiver about specific rehydration instructions.  Drink enough fluids to keep your urine clear or pale yellow.  Drink small amounts frequently if you have nausea and vomiting.  Eat as you normally do.  Avoid:  Foods or drinks high in sugar.  Carbonated  drinks.  Juice.  Extremely hot or cold fluids.  Drinks with caffeine.  Fatty, greasy foods.  Alcohol.  Tobacco.  Overeating.  Gelatin desserts.  Wash your hands well to avoid spreading bacteria and viruses.  Only take over-the-counter or prescription medicines for pain, discomfort, or fever as directed by your caregiver.  Ask your caregiver if you should continue all prescribed and over-the-counter medicines.  Keep all follow-up appointments with your caregiver. SEEK MEDICAL CARE IF:  You have abdominal pain and it increases or stays in one area (localizes).  You have a rash, stiff neck, or severe headache.  You are irritable, sleepy, or difficult to awaken.  You are weak, dizzy, or extremely thirsty. SEEK IMMEDIATE MEDICAL CARE IF:   You are unable to keep fluids down or you get worse despite treatment.  You have frequent episodes of vomiting or diarrhea.  You have blood or green matter (bile) in your vomit.  You have blood in your stool or your stool looks black and tarry.  You have not urinated in 6 to 8 hours, or you have only urinated a small amount of very dark urine.  You have a fever.  You faint. MAKE SURE YOU:   Understand these instructions.  Will watch your condition.  Will get help right away if you are not doing well or get worse. Document Released: 01/29/2005 Document Revised: 04/23/2011 Document Reviewed: 09/18/2010 ExitCare Patient Information 2015 [employer]. This information is not intended to replace advice given to you by your health care provider. Make sure you discuss any questions you have with your health care   provider.  

## 2013-10-21 NOTE — Progress Notes (Signed)
Per patient.  He is post transplant July 22.2015 and not allowed to have flu vaccine at this time

## 2013-10-23 ENCOUNTER — Telehealth: Payer: Self-pay | Admitting: *Deleted

## 2013-10-23 ENCOUNTER — Ambulatory Visit (HOSPITAL_BASED_OUTPATIENT_CLINIC_OR_DEPARTMENT_OTHER): Payer: Medicare Other

## 2013-10-23 ENCOUNTER — Other Ambulatory Visit: Payer: Self-pay | Admitting: *Deleted

## 2013-10-23 VITALS — BP 138/71 | HR 80 | Temp 98.2°F

## 2013-10-23 DIAGNOSIS — N289 Disorder of kidney and ureter, unspecified: Secondary | ICD-10-CM

## 2013-10-23 DIAGNOSIS — E860 Dehydration: Secondary | ICD-10-CM

## 2013-10-23 MED ORDER — SODIUM CHLORIDE 0.9 % IV SOLN
Freq: Once | INTRAVENOUS | Status: AC
  Administered 2013-10-23: 11:00:00 via INTRAVENOUS

## 2013-10-23 MED ORDER — SODIUM CHLORIDE 0.9 % IJ SOLN
10.0000 mL | INTRAMUSCULAR | Status: AC | PRN
  Administered 2013-10-23: 10 mL
  Filled 2013-10-23: qty 10

## 2013-10-23 MED ORDER — HEPARIN SOD (PORK) LOCK FLUSH 100 UNIT/ML IV SOLN
250.0000 [IU] | INTRAVENOUS | Status: AC | PRN
  Administered 2013-10-23: 250 [IU]
  Filled 2013-10-23: qty 5

## 2013-10-23 NOTE — Patient Instructions (Signed)

## 2013-10-23 NOTE — Telephone Encounter (Signed)
Per charge and desk RN I have schedueld appts for today and tomorrow

## 2013-10-23 NOTE — Telephone Encounter (Signed)
Per [facility], pt needs IVF on 9/10 and 9/11.  Pt is aware of appts.  SLJ

## 2013-10-24 ENCOUNTER — Ambulatory Visit (HOSPITAL_BASED_OUTPATIENT_CLINIC_OR_DEPARTMENT_OTHER): Payer: Medicare Other

## 2013-10-24 VITALS — BP 176/69 | HR 74 | Temp 97.5°F

## 2013-10-24 DIAGNOSIS — N289 Disorder of kidney and ureter, unspecified: Secondary | ICD-10-CM

## 2013-10-24 DIAGNOSIS — D7581 Myelofibrosis: Secondary | ICD-10-CM

## 2013-10-24 DIAGNOSIS — E860 Dehydration: Secondary | ICD-10-CM

## 2013-10-24 MED ORDER — SODIUM CHLORIDE 0.9 % IV SOLN
Freq: Once | INTRAVENOUS | Status: AC
  Administered 2013-10-24: 09:00:00 via INTRAVENOUS

## 2013-10-24 MED ORDER — HEPARIN SOD (PORK) LOCK FLUSH 100 UNIT/ML IV SOLN
250.0000 [IU] | Freq: Once | INTRAVENOUS | Status: AC | PRN
  Administered 2013-10-24: 250 [IU]
  Filled 2013-10-24: qty 5

## 2013-10-24 MED ORDER — SODIUM CHLORIDE 0.9 % IJ SOLN
10.0000 mL | INTRAMUSCULAR | Status: DC | PRN
  Administered 2013-10-24: 10 mL
  Filled 2013-10-24: qty 10

## 2013-10-24 NOTE — Patient Instructions (Signed)
Dehydration, Adult Dehydration is when you lose more fluids from the body than you take in. Vital organs like the kidneys, brain, and heart cannot function without a proper amount of fluids and salt. Any loss of fluids from the body can cause dehydration.  CAUSES   Vomiting.  Diarrhea.  Excessive sweating.  Excessive urine output.  Fever. SYMPTOMS  Mild dehydration  Thirst.  Dry lips.  Slightly dry mouth. Moderate dehydration  Very dry mouth.  Sunken eyes.  Skin does not bounce back quickly when lightly pinched and released.  Dark urine and decreased urine production.  Decreased tear production.  Headache. Severe dehydration  Very dry mouth.  Extreme thirst.  Rapid, weak pulse (more than 100 beats per minute at rest).  Cold hands and feet.  Not able to sweat in spite of heat and temperature.  Rapid breathing.  Blue lips.  Confusion and lethargy.  Difficulty being awakened.  Minimal urine production.  No tears. DIAGNOSIS  Your caregiver will diagnose dehydration based on your symptoms and your exam. Blood and urine tests will help confirm the diagnosis. The diagnostic evaluation should also identify the cause of dehydration. TREATMENT  Treatment of mild or moderate dehydration can often be done at home by increasing the amount of fluids that you drink. It is best to drink small amounts of fluid more often. Drinking too much at one time can make vomiting worse. Refer to the home care instructions below. Severe dehydration needs to be treated at the hospital where you will probably be given intravenous (IV) fluids that contain water and electrolytes. HOME CARE INSTRUCTIONS   Ask your caregiver about specific rehydration instructions.  Drink enough fluids to keep your urine clear or pale yellow.  Drink small amounts frequently if you have nausea and vomiting.  Eat as you normally do.  Avoid:  Foods or drinks high in sugar.  Carbonated  drinks.  Juice.  Extremely hot or cold fluids.  Drinks with caffeine.  Fatty, greasy foods.  Alcohol.  Tobacco.  Overeating.  Gelatin desserts.  Wash your hands well to avoid spreading bacteria and viruses.  Only take over-the-counter or prescription medicines for pain, discomfort, or fever as directed by your caregiver.  Ask your caregiver if you should continue all prescribed and over-the-counter medicines.  Keep all follow-up appointments with your caregiver. SEEK MEDICAL CARE IF:  You have abdominal pain and it increases or stays in one area (localizes).  You have a rash, stiff neck, or severe headache.  You are irritable, sleepy, or difficult to awaken.  You are weak, dizzy, or extremely thirsty. SEEK IMMEDIATE MEDICAL CARE IF:   You are unable to keep fluids down or you get worse despite treatment.  You have frequent episodes of vomiting or diarrhea.  You have blood or green matter (bile) in your vomit.  You have blood in your stool or your stool looks black and tarry.  You have not urinated in 6 to 8 hours, or you have only urinated a small amount of very dark urine.  You have a fever.  You faint. MAKE SURE YOU:   Understand these instructions.  Will watch your condition.  Will get help right away if you are not doing well or get worse. Document Released: 01/29/2005 Document Revised: 04/23/2011 Document Reviewed: 09/18/2010 ExitCare Patient Information 2015 [employer]. This information is not intended to replace advice given to you by your health care provider. Make sure you discuss any questions you have with your health care   provider.  

## 2013-10-26 ENCOUNTER — Telehealth: Payer: Self-pay | Admitting: *Deleted

## 2013-10-26 ENCOUNTER — Other Ambulatory Visit: Payer: Self-pay | Admitting: *Deleted

## 2013-10-26 DIAGNOSIS — N289 Disorder of kidney and ureter, unspecified: Secondary | ICD-10-CM

## 2013-10-26 NOTE — Telephone Encounter (Signed)
Per [facility], pt needs IVF 9/15 and 9/16.  Called and spoke to pt.  He verbalized understanding.

## 2013-10-26 NOTE — Telephone Encounter (Signed)
Message copied by Britt Bottom on Mon Oct 26, 2013  2:12 PM ------      Message from: WHEAT, JACQUELINE M      Created: Mon Oct 26, 2013  1:45 PM      Regarding: RE: IVF 9/15 and 9/16       appts in       ----- Message -----         From: Anders Grant, RN         Sent: 10/26/2013   1:39 PM           To: Orie Rout, NT      Subject: IVF 9/15 and 9/16                                        Can we do IVF for Mr. Hangartner on 9/15 and 9/16?       ------

## 2013-10-26 NOTE — Telephone Encounter (Signed)
Per staff message from desk RN I have scheduled apapts. Desk RN to call patient

## 2013-10-27 ENCOUNTER — Ambulatory Visit (HOSPITAL_BASED_OUTPATIENT_CLINIC_OR_DEPARTMENT_OTHER): Payer: Medicare Other

## 2013-10-27 VITALS — BP 150/67 | HR 79 | Temp 98.2°F | Resp 20

## 2013-10-27 DIAGNOSIS — N289 Disorder of kidney and ureter, unspecified: Secondary | ICD-10-CM

## 2013-10-27 DIAGNOSIS — D7581 Myelofibrosis: Secondary | ICD-10-CM

## 2013-10-27 MED ORDER — HEPARIN SOD (PORK) LOCK FLUSH 100 UNIT/ML IV SOLN
250.0000 [IU] | Freq: Once | INTRAVENOUS | Status: AC | PRN
  Administered 2013-10-27: 250 [IU]
  Filled 2013-10-27: qty 5

## 2013-10-27 MED ORDER — SODIUM CHLORIDE 0.9 % IV SOLN
1000.0000 mL | Freq: Once | INTRAVENOUS | Status: AC
  Administered 2013-10-27: 13:00:00 via INTRAVENOUS

## 2013-10-27 MED ORDER — SODIUM CHLORIDE 0.9 % IJ SOLN
10.0000 mL | INTRAMUSCULAR | Status: DC | PRN
  Administered 2013-10-27: 10 mL
  Filled 2013-10-27: qty 10

## 2013-10-27 NOTE — Patient Instructions (Signed)
Dehydration, Adult Dehydration is when you lose more fluids from the body than you take in. Vital organs like the kidneys, brain, and heart cannot function without a proper amount of fluids and salt. Any loss of fluids from the body can cause dehydration.  CAUSES   Vomiting.  Diarrhea.  Excessive sweating.  Excessive urine output.  Fever. SYMPTOMS  Mild dehydration  Thirst.  Dry lips.  Slightly dry mouth. Moderate dehydration  Very dry mouth.  Sunken eyes.  Skin does not bounce back quickly when lightly pinched and released.  Dark urine and decreased urine production.  Decreased tear production.  Headache. Severe dehydration  Very dry mouth.  Extreme thirst.  Rapid, weak pulse (more than 100 beats per minute at rest).  Cold hands and feet.  Not able to sweat in spite of heat and temperature.  Rapid breathing.  Blue lips.  Confusion and lethargy.  Difficulty being awakened.  Minimal urine production.  No tears. DIAGNOSIS  Your caregiver will diagnose dehydration based on your symptoms and your exam. Blood and urine tests will help confirm the diagnosis. The diagnostic evaluation should also identify the cause of dehydration. TREATMENT  Treatment of mild or moderate dehydration can often be done at home by increasing the amount of fluids that you drink. It is best to drink small amounts of fluid more often. Drinking too much at one time can make vomiting worse. Refer to the home care instructions below. Severe dehydration needs to be treated at the hospital where you will probably be given intravenous (IV) fluids that contain water and electrolytes. HOME CARE INSTRUCTIONS   Ask your caregiver about specific rehydration instructions.  Drink enough fluids to keep your urine clear or pale yellow.  Drink small amounts frequently if you have nausea and vomiting.  Eat as you normally do.  Avoid:  Foods or drinks high in sugar.  Carbonated  drinks.  Juice.  Extremely hot or cold fluids.  Drinks with caffeine.  Fatty, greasy foods.  Alcohol.  Tobacco.  Overeating.  Gelatin desserts.  Wash your hands well to avoid spreading bacteria and viruses.  Only take over-the-counter or prescription medicines for pain, discomfort, or fever as directed by your caregiver.  Ask your caregiver if you should continue all prescribed and over-the-counter medicines.  Keep all follow-up appointments with your caregiver. SEEK MEDICAL CARE IF:  You have abdominal pain and it increases or stays in one area (localizes).  You have a rash, stiff neck, or severe headache.  You are irritable, sleepy, or difficult to awaken.  You are weak, dizzy, or extremely thirsty. SEEK IMMEDIATE MEDICAL CARE IF:   You are unable to keep fluids down or you get worse despite treatment.  You have frequent episodes of vomiting or diarrhea.  You have blood or green matter (bile) in your vomit.  You have blood in your stool or your stool looks black and tarry.  You have not urinated in 6 to 8 hours, or you have only urinated a small amount of very dark urine.  You have a fever.  You faint. MAKE SURE YOU:   Understand these instructions.  Will watch your condition.  Will get help right away if you are not doing well or get worse. Document Released: 01/29/2005 Document Revised: 04/23/2011 Document Reviewed: 09/18/2010 ExitCare Patient Information 2015 [employer]. This information is not intended to replace advice given to you by your health care provider. Make sure you discuss any questions you have with your health care   provider.  

## 2013-10-28 ENCOUNTER — Ambulatory Visit (HOSPITAL_BASED_OUTPATIENT_CLINIC_OR_DEPARTMENT_OTHER): Payer: Medicare Other

## 2013-10-28 VITALS — BP 135/63 | HR 80 | Temp 97.6°F | Resp 18

## 2013-10-28 DIAGNOSIS — R7989 Other specified abnormal findings of blood chemistry: Secondary | ICD-10-CM

## 2013-10-28 DIAGNOSIS — N289 Disorder of kidney and ureter, unspecified: Secondary | ICD-10-CM

## 2013-10-28 MED ORDER — HEPARIN SOD (PORK) LOCK FLUSH 100 UNIT/ML IV SOLN
500.0000 [IU] | Freq: Once | INTRAVENOUS | Status: AC
  Administered 2013-10-28: 500 [IU] via INTRAVENOUS
  Filled 2013-10-28: qty 5

## 2013-10-28 MED ORDER — SODIUM CHLORIDE 0.9 % IJ SOLN
10.0000 mL | INTRAMUSCULAR | Status: DC | PRN
  Administered 2013-10-28: 10 mL via INTRAVENOUS
  Filled 2013-10-28: qty 10

## 2013-10-28 MED ORDER — SODIUM CHLORIDE 0.9 % IV SOLN
INTRAVENOUS | Status: DC
  Administered 2013-10-28: 15:00:00 via INTRAVENOUS

## 2013-10-28 NOTE — Patient Instructions (Signed)
Dehydration, Adult Dehydration is when you lose more fluids from the body than you take in. Vital organs like the kidneys, brain, and heart cannot function without a proper amount of fluids and salt. Any loss of fluids from the body can cause dehydration.  CAUSES   Vomiting.  Diarrhea.  Excessive sweating.  Excessive urine output.  Fever. SYMPTOMS  Mild dehydration  Thirst.  Dry lips.  Slightly dry mouth. Moderate dehydration  Very dry mouth.  Sunken eyes.  Skin does not bounce back quickly when lightly pinched and released.  Dark urine and decreased urine production.  Decreased tear production.  Headache. Severe dehydration  Very dry mouth.  Extreme thirst.  Rapid, weak pulse (more than 100 beats per minute at rest).  Cold hands and feet.  Not able to sweat in spite of heat and temperature.  Rapid breathing.  Blue lips.  Confusion and lethargy.  Difficulty being awakened.  Minimal urine production.  No tears. DIAGNOSIS  Your caregiver will diagnose dehydration based on your symptoms and your exam. Blood and urine tests will help confirm the diagnosis. The diagnostic evaluation should also identify the cause of dehydration. TREATMENT  Treatment of mild or moderate dehydration can often be done at home by increasing the amount of fluids that you drink. It is best to drink small amounts of fluid more often. Drinking too much at one time can make vomiting worse. Refer to the home care instructions below. Severe dehydration needs to be treated at the hospital where you will probably be given intravenous (IV) fluids that contain water and electrolytes. HOME CARE INSTRUCTIONS   Ask your caregiver about specific rehydration instructions.  Drink enough fluids to keep your urine clear or pale yellow.  Drink small amounts frequently if you have nausea and vomiting.  Eat as you normally do.  Avoid:  Foods or drinks high in sugar.  Carbonated  drinks.  Juice.  Extremely hot or cold fluids.  Drinks with caffeine.  Fatty, greasy foods.  Alcohol.  Tobacco.  Overeating.  Gelatin desserts.  Wash your hands well to avoid spreading bacteria and viruses.  Only take over-the-counter or prescription medicines for pain, discomfort, or fever as directed by your caregiver.  Ask your caregiver if you should continue all prescribed and over-the-counter medicines.  Keep all follow-up appointments with your caregiver. SEEK MEDICAL CARE IF:  You have abdominal pain and it increases or stays in one area (localizes).  You have a rash, stiff neck, or severe headache.  You are irritable, sleepy, or difficult to awaken.  You are weak, dizzy, or extremely thirsty. SEEK IMMEDIATE MEDICAL CARE IF:   You are unable to keep fluids down or you get worse despite treatment.  You have frequent episodes of vomiting or diarrhea.  You have blood or green matter (bile) in your vomit.  You have blood in your stool or your stool looks black and tarry.  You have not urinated in 6 to 8 hours, or you have only urinated a small amount of very dark urine.  You have a fever.  You faint. MAKE SURE YOU:   Understand these instructions.  Will watch your condition.  Will get help right away if you are not doing well or get worse. Document Released: 01/29/2005 Document Revised: 04/23/2011 Document Reviewed: 09/18/2010 ExitCare Patient Information 2015 [employer]. This information is not intended to replace advice given to you by your health care provider. Make sure you discuss any questions you have with your health care   provider.  

## 2013-11-05 ENCOUNTER — Telehealth: Payer: Self-pay | Admitting: *Deleted

## 2013-11-05 ENCOUNTER — Other Ambulatory Visit: Payer: Self-pay | Admitting: Medical Oncology

## 2013-11-05 DIAGNOSIS — R7989 Other specified abnormal findings of blood chemistry: Secondary | ICD-10-CM

## 2013-11-05 NOTE — Progress Notes (Signed)
Orders received form [facility] for IVF . Onc Tx request sent.

## 2013-11-05 NOTE — Telephone Encounter (Signed)
Per POF and staff message I have tried to schedule appts. Left patient message to call me tomorrow

## 2013-11-06 ENCOUNTER — Other Ambulatory Visit: Payer: Self-pay | Admitting: Medical Oncology

## 2013-11-06 ENCOUNTER — Telehealth: Payer: Self-pay | Admitting: *Deleted

## 2013-11-06 NOTE — Telephone Encounter (Signed)
Talked with patient's wife and scheduled appts for the next week./

## 2013-11-07 ENCOUNTER — Other Ambulatory Visit: Payer: Self-pay | Admitting: Internal Medicine

## 2013-11-07 ENCOUNTER — Ambulatory Visit (HOSPITAL_BASED_OUTPATIENT_CLINIC_OR_DEPARTMENT_OTHER): Payer: Medicare Other

## 2013-11-07 VITALS — BP 146/66 | HR 77 | Temp 97.4°F | Resp 18

## 2013-11-07 DIAGNOSIS — E860 Dehydration: Secondary | ICD-10-CM

## 2013-11-07 DIAGNOSIS — D7581 Myelofibrosis: Secondary | ICD-10-CM

## 2013-11-07 DIAGNOSIS — R7989 Other specified abnormal findings of blood chemistry: Secondary | ICD-10-CM

## 2013-11-07 MED ORDER — SODIUM CHLORIDE 0.9 % IJ SOLN
10.0000 mL | INTRAMUSCULAR | Status: DC | PRN
  Administered 2013-11-07: 10 mL
  Filled 2013-11-07: qty 10

## 2013-11-07 MED ORDER — HEPARIN SOD (PORK) LOCK FLUSH 100 UNIT/ML IV SOLN
250.0000 [IU] | Freq: Once | INTRAVENOUS | Status: AC | PRN
  Administered 2013-11-07: 250 [IU]
  Filled 2013-11-07: qty 5

## 2013-11-07 MED ORDER — SODIUM CHLORIDE 0.9 % IV SOLN
INTRAVENOUS | Status: DC
  Administered 2013-11-07: 10:00:00 via INTRAVENOUS

## 2013-11-07 NOTE — Patient Instructions (Signed)
Dehydration, Adult Dehydration is when you lose more fluids from the body than you take in. Vital organs like the kidneys, brain, and heart cannot function without a proper amount of fluids and salt. Any loss of fluids from the body can cause dehydration.  CAUSES   Vomiting.  Diarrhea.  Excessive sweating.  Excessive urine output.  Fever. SYMPTOMS  Mild dehydration  Thirst.  Dry lips.  Slightly dry mouth. Moderate dehydration  Very dry mouth.  Sunken eyes.  Skin does not bounce back quickly when lightly pinched and released.  Dark urine and decreased urine production.  Decreased tear production.  Headache. Severe dehydration  Very dry mouth.  Extreme thirst.  Rapid, weak pulse (more than 100 beats per minute at rest).  Cold hands and feet.  Not able to sweat in spite of heat and temperature.  Rapid breathing.  Blue lips.  Confusion and lethargy.  Difficulty being awakened.  Minimal urine production.  No tears. DIAGNOSIS  Your caregiver will diagnose dehydration based on your symptoms and your exam. Blood and urine tests will help confirm the diagnosis. The diagnostic evaluation should also identify the cause of dehydration. TREATMENT  Treatment of mild or moderate dehydration can often be done at home by increasing the amount of fluids that you drink. It is best to drink small amounts of fluid more often. Drinking too much at one time can make vomiting worse. Refer to the home care instructions below. Severe dehydration needs to be treated at the hospital where you will probably be given intravenous (IV) fluids that contain water and electrolytes. HOME CARE INSTRUCTIONS   Ask your caregiver about specific rehydration instructions.  Drink enough fluids to keep your urine clear or pale yellow.  Drink small amounts frequently if you have nausea and vomiting.  Eat as you normally do.  Avoid:  Foods or drinks high in sugar.  Carbonated  drinks.  Juice.  Extremely hot or cold fluids.  Drinks with caffeine.  Fatty, greasy foods.  Alcohol.  Tobacco.  Overeating.  Gelatin desserts.  Wash your hands well to avoid spreading bacteria and viruses.  Only take over-the-counter or prescription medicines for pain, discomfort, or fever as directed by your caregiver.  Ask your caregiver if you should continue all prescribed and over-the-counter medicines.  Keep all follow-up appointments with your caregiver. SEEK MEDICAL CARE IF:  You have abdominal pain and it increases or stays in one area (localizes).  You have a rash, stiff neck, or severe headache.  You are irritable, sleepy, or difficult to awaken.  You are weak, dizzy, or extremely thirsty. SEEK IMMEDIATE MEDICAL CARE IF:   You are unable to keep fluids down or you get worse despite treatment.  You have frequent episodes of vomiting or diarrhea.  You have blood or green matter (bile) in your vomit.  You have blood in your stool or your stool looks black and tarry.  You have not urinated in 6 to 8 hours, or you have only urinated a small amount of very dark urine.  You have a fever.  You faint. MAKE SURE YOU:   Understand these instructions.  Will watch your condition.  Will get help right away if you are not doing well or get worse. Document Released: 01/29/2005 Document Revised: 04/23/2011 Document Reviewed: 09/18/2010 ExitCare Patient Information 2015 [employer]. This information is not intended to replace advice given to you by your health care provider. Make sure you discuss any questions you have with your health care   provider.  

## 2013-11-10 ENCOUNTER — Ambulatory Visit (HOSPITAL_BASED_OUTPATIENT_CLINIC_OR_DEPARTMENT_OTHER): Payer: Medicare Other

## 2013-11-10 ENCOUNTER — Other Ambulatory Visit: Payer: Self-pay | Admitting: Medical Oncology

## 2013-11-10 VITALS — BP 112/62 | HR 88 | Temp 97.6°F | Resp 18

## 2013-11-10 DIAGNOSIS — D7581 Myelofibrosis: Secondary | ICD-10-CM

## 2013-11-10 DIAGNOSIS — N289 Disorder of kidney and ureter, unspecified: Secondary | ICD-10-CM

## 2013-11-10 MED ORDER — SODIUM CHLORIDE 0.9 % IJ SOLN
10.0000 mL | INTRAMUSCULAR | Status: DC | PRN
  Administered 2013-11-10: 10 mL
  Filled 2013-11-10: qty 10

## 2013-11-10 MED ORDER — HEPARIN SOD (PORK) LOCK FLUSH 100 UNIT/ML IV SOLN
500.0000 [IU] | Freq: Once | INTRAVENOUS | Status: AC | PRN
  Administered 2013-11-10: 500 [IU]
  Filled 2013-11-10: qty 5

## 2013-11-10 MED ORDER — SODIUM CHLORIDE 0.9 % IV SOLN
Freq: Once | INTRAVENOUS | Status: AC
  Administered 2013-11-10: 14:00:00 via INTRAVENOUS

## 2013-11-10 NOTE — Patient Instructions (Signed)
Dehydration, Adult Dehydration is when you lose more fluids from the body than you take in. Vital organs like the kidneys, brain, and heart cannot function without a proper amount of fluids and salt. Any loss of fluids from the body can cause dehydration.  CAUSES   Vomiting.  Diarrhea.  Excessive sweating.  Excessive urine output.  Fever. SYMPTOMS  Mild dehydration  Thirst.  Dry lips.  Slightly dry mouth. Moderate dehydration  Very dry mouth.  Sunken eyes.  Skin does not bounce back quickly when lightly pinched and released.  Dark urine and decreased urine production.  Decreased tear production.  Headache. Severe dehydration  Very dry mouth.  Extreme thirst.  Rapid, weak pulse (more than 100 beats per minute at rest).  Cold hands and feet.  Not able to sweat in spite of heat and temperature.  Rapid breathing.  Blue lips.  Confusion and lethargy.  Difficulty being awakened.  Minimal urine production.  No tears. DIAGNOSIS  Your caregiver will diagnose dehydration based on your symptoms and your exam. Blood and urine tests will help confirm the diagnosis. The diagnostic evaluation should also identify the cause of dehydration. TREATMENT  Treatment of mild or moderate dehydration can often be done at home by increasing the amount of fluids that you drink. It is best to drink small amounts of fluid more often. Drinking too much at one time can make vomiting worse. Refer to the home care instructions below. Severe dehydration needs to be treated at the hospital where you will probably be given intravenous (IV) fluids that contain water and electrolytes. HOME CARE INSTRUCTIONS   Ask your caregiver about specific rehydration instructions.  Drink enough fluids to keep your urine clear or pale yellow.  Drink small amounts frequently if you have nausea and vomiting.  Eat as you normally do.  Avoid:  Foods or drinks high in sugar.  Carbonated  drinks.  Juice.  Extremely hot or cold fluids.  Drinks with caffeine.  Fatty, greasy foods.  Alcohol.  Tobacco.  Overeating.  Gelatin desserts.  Wash your hands well to avoid spreading bacteria and viruses.  Only take over-the-counter or prescription medicines for pain, discomfort, or fever as directed by your caregiver.  Ask your caregiver if you should continue all prescribed and over-the-counter medicines.  Keep all follow-up appointments with your caregiver. SEEK MEDICAL CARE IF:  You have abdominal pain and it increases or stays in one area (localizes).  You have a rash, stiff neck, or severe headache.  You are irritable, sleepy, or difficult to awaken.  You are weak, dizzy, or extremely thirsty. SEEK IMMEDIATE MEDICAL CARE IF:   You are unable to keep fluids down or you get worse despite treatment.  You have frequent episodes of vomiting or diarrhea.  You have blood or green matter (bile) in your vomit.  You have blood in your stool or your stool looks black and tarry.  You have not urinated in 6 to 8 hours, or you have only urinated a small amount of very dark urine.  You have a fever.  You faint. MAKE SURE YOU:   Understand these instructions.  Will watch your condition.  Will get help right away if you are not doing well or get worse. Document Released: 01/29/2005 Document Revised: 04/23/2011 Document Reviewed: 09/18/2010 ExitCare Patient Information 2015 [employer]. This information is not intended to replace advice given to you by your health care provider. Make sure you discuss any questions you have with your health care   provider.  

## 2013-11-14 ENCOUNTER — Ambulatory Visit (HOSPITAL_BASED_OUTPATIENT_CLINIC_OR_DEPARTMENT_OTHER): Payer: Medicare Other

## 2013-11-14 VITALS — BP 187/72 | HR 71 | Temp 97.9°F | Resp 16

## 2013-11-14 DIAGNOSIS — D7581 Myelofibrosis: Secondary | ICD-10-CM

## 2013-11-14 MED ORDER — SODIUM CHLORIDE 0.9 % IV SOLN
Freq: Once | INTRAVENOUS | Status: AC
  Administered 2013-11-14: 09:00:00 via INTRAVENOUS

## 2013-11-14 MED ORDER — SODIUM CHLORIDE 0.9 % IJ SOLN
10.0000 mL | INTRAMUSCULAR | Status: DC | PRN
Start: 2013-11-14 — End: 2013-11-14
  Administered 2013-11-14: 10 mL
  Filled 2013-11-14: qty 10

## 2013-11-14 MED ORDER — HEPARIN SOD (PORK) LOCK FLUSH 100 UNIT/ML IV SOLN
250.0000 [IU] | Freq: Once | INTRAVENOUS | Status: AC | PRN
  Administered 2013-11-14: 250 [IU]
  Filled 2013-11-14: qty 5

## 2013-11-14 NOTE — Patient Instructions (Signed)

## 2013-11-17 ENCOUNTER — Ambulatory Visit (HOSPITAL_BASED_OUTPATIENT_CLINIC_OR_DEPARTMENT_OTHER): Payer: Medicare Other

## 2013-11-17 VITALS — BP 107/61 | HR 94 | Temp 96.9°F

## 2013-11-17 DIAGNOSIS — E860 Dehydration: Secondary | ICD-10-CM

## 2013-11-17 DIAGNOSIS — D7581 Myelofibrosis: Secondary | ICD-10-CM

## 2013-11-17 MED ORDER — SODIUM CHLORIDE 0.9 % IJ SOLN
10.0000 mL | INTRAMUSCULAR | Status: DC | PRN
Start: 2013-11-17 — End: 2013-11-17
  Administered 2013-11-17: 10 mL
  Filled 2013-11-17: qty 10

## 2013-11-17 MED ORDER — HEPARIN SOD (PORK) LOCK FLUSH 100 UNIT/ML IV SOLN
250.0000 [IU] | Freq: Once | INTRAVENOUS | Status: AC | PRN
  Administered 2013-11-17: 500 [IU]
  Filled 2013-11-17: qty 5

## 2013-11-17 MED ORDER — SODIUM CHLORIDE 0.9 % IV SOLN
1000.0000 mL | Freq: Once | INTRAVENOUS | Status: AC
  Administered 2013-11-17: 14:00:00 via INTRAVENOUS

## 2013-11-17 NOTE — Patient Instructions (Signed)
Dehydration, Adult Dehydration is when you lose more fluids from the body than you take in. Vital organs like the kidneys, brain, and heart cannot function without a proper amount of fluids and salt. Any loss of fluids from the body can cause dehydration.  CAUSES   Vomiting.  Diarrhea.  Excessive sweating.  Excessive urine output.  Fever. SYMPTOMS  Mild dehydration  Thirst.  Dry lips.  Slightly dry mouth. Moderate dehydration  Very dry mouth.  Sunken eyes.  Skin does not bounce back quickly when lightly pinched and released.  Dark urine and decreased urine production.  Decreased tear production.  Headache. Severe dehydration  Very dry mouth.  Extreme thirst.  Rapid, weak pulse (more than 100 beats per minute at rest).  Cold hands and feet.  Not able to sweat in spite of heat and temperature.  Rapid breathing.  Blue lips.  Confusion and lethargy.  Difficulty being awakened.  Minimal urine production.  No tears. DIAGNOSIS  Your caregiver will diagnose dehydration based on your symptoms and your exam. Blood and urine tests will help confirm the diagnosis. The diagnostic evaluation should also identify the cause of dehydration. TREATMENT  Treatment of mild or moderate dehydration can often be done at home by increasing the amount of fluids that you drink. It is best to drink small amounts of fluid more often. Drinking too much at one time can make vomiting worse. Refer to the home care instructions below. Severe dehydration needs to be treated at the hospital where you will probably be given intravenous (IV) fluids that contain water and electrolytes. HOME CARE INSTRUCTIONS   Ask your caregiver about specific rehydration instructions.  Drink enough fluids to keep your urine clear or pale yellow.  Drink small amounts frequently if you have nausea and vomiting.  Eat as you normally do.  Avoid:  Foods or drinks high in sugar.  Carbonated  drinks.  Juice.  Extremely hot or cold fluids.  Drinks with caffeine.  Fatty, greasy foods.  Alcohol.  Tobacco.  Overeating.  Gelatin desserts.  Wash your hands well to avoid spreading bacteria and viruses.  Only take over-the-counter or prescription medicines for pain, discomfort, or fever as directed by your caregiver.  Ask your caregiver if you should continue all prescribed and over-the-counter medicines.  Keep all follow-up appointments with your caregiver. SEEK MEDICAL CARE IF:  You have abdominal pain and it increases or stays in one area (localizes).  You have a rash, stiff neck, or severe headache.  You are irritable, sleepy, or difficult to awaken.  You are weak, dizzy, or extremely thirsty. SEEK IMMEDIATE MEDICAL CARE IF:   You are unable to keep fluids down or you get worse despite treatment.  You have frequent episodes of vomiting or diarrhea.  You have blood or green matter (bile) in your vomit.  You have blood in your stool or your stool looks black and tarry.  You have not urinated in 6 to 8 hours, or you have only urinated a small amount of very dark urine.  You have a fever.  You faint. MAKE SURE YOU:   Understand these instructions.  Will watch your condition.  Will get help right away if you are not doing well or get worse. Document Released: 01/29/2005 Document Revised: 04/23/2011 Document Reviewed: 09/18/2010 ExitCare Patient Information 2015 [employer]. This information is not intended to replace advice given to you by your health care provider. Make sure you discuss any questions you have with your health care   provider.  

## 2013-11-21 ENCOUNTER — Ambulatory Visit (HOSPITAL_BASED_OUTPATIENT_CLINIC_OR_DEPARTMENT_OTHER): Payer: Medicare Other

## 2013-11-21 VITALS — BP 170/79 | HR 86 | Temp 97.9°F | Resp 18

## 2013-11-21 DIAGNOSIS — D7581 Myelofibrosis: Secondary | ICD-10-CM

## 2013-11-21 DIAGNOSIS — E860 Dehydration: Secondary | ICD-10-CM

## 2013-11-21 MED ORDER — SODIUM CHLORIDE 0.9 % IJ SOLN
10.0000 mL | Freq: Once | INTRAMUSCULAR | Status: AC
  Administered 2013-11-21: 10 mL
  Filled 2013-11-21: qty 10

## 2013-11-21 MED ORDER — HEPARIN SOD (PORK) LOCK FLUSH 100 UNIT/ML IV SOLN
250.0000 [IU] | Freq: Once | INTRAVENOUS | Status: AC
  Administered 2013-11-21: 250 [IU] via INTRAVENOUS
  Filled 2013-11-21: qty 5

## 2013-11-21 MED ORDER — SODIUM CHLORIDE 0.9 % IV SOLN
Freq: Once | INTRAVENOUS | Status: AC
  Administered 2013-11-21: 09:00:00 via INTRAVENOUS

## 2013-11-21 MED ORDER — HEPARIN SOD (PORK) LOCK FLUSH 100 UNIT/ML IV SOLN
250.0000 [IU] | Freq: Once | INTRAVENOUS | Status: AC
  Administered 2013-11-21: 250 [IU]
  Filled 2013-11-21: qty 5

## 2013-11-21 MED ORDER — HEPARIN SOD (PORK) LOCK FLUSH 100 UNIT/ML IV SOLN
250.0000 [IU] | Freq: Once | INTRAVENOUS | Status: AC | PRN
  Administered 2013-11-21: 250 [IU]
  Filled 2013-11-21: qty 5

## 2013-11-21 MED ORDER — SODIUM CHLORIDE 0.9 % IJ SOLN
10.0000 mL | INTRAMUSCULAR | Status: DC | PRN
  Administered 2013-11-21: 10 mL
  Filled 2013-11-21: qty 10

## 2013-11-21 NOTE — Patient Instructions (Signed)
Dehydration, Adult Dehydration is when you lose more fluids from the body than you take in. Vital organs like the kidneys, brain, and heart cannot function without a proper amount of fluids and salt. Any loss of fluids from the body can cause dehydration.  CAUSES   Vomiting.  Diarrhea.  Excessive sweating.  Excessive urine output.  Fever. SYMPTOMS  Mild dehydration  Thirst.  Dry lips.  Slightly dry mouth. Moderate dehydration  Very dry mouth.  Sunken eyes.  Skin does not bounce back quickly when lightly pinched and released.  Dark urine and decreased urine production.  Decreased tear production.  Headache. Severe dehydration  Very dry mouth.  Extreme thirst.  Rapid, weak pulse (more than 100 beats per minute at rest).  Cold hands and feet.  Not able to sweat in spite of heat and temperature.  Rapid breathing.  Blue lips.  Confusion and lethargy.  Difficulty being awakened.  Minimal urine production.  No tears. DIAGNOSIS  Your caregiver will diagnose dehydration based on your symptoms and your exam. Blood and urine tests will help confirm the diagnosis. The diagnostic evaluation should also identify the cause of dehydration. TREATMENT  Treatment of mild or moderate dehydration can often be done at home by increasing the amount of fluids that you drink. It is best to drink small amounts of fluid more often. Drinking too much at one time can make vomiting worse. Refer to the home care instructions below. Severe dehydration needs to be treated at the hospital where you will probably be given intravenous (IV) fluids that contain water and electrolytes. HOME CARE INSTRUCTIONS   Ask your caregiver about specific rehydration instructions.  Drink enough fluids to keep your urine clear or pale yellow.  Drink small amounts frequently if you have nausea and vomiting.  Eat as you normally do.  Avoid:  Foods or drinks high in sugar.  Carbonated  drinks.  Juice.  Extremely hot or cold fluids.  Drinks with caffeine.  Fatty, greasy foods.  Alcohol.  Tobacco.  Overeating.  Gelatin desserts.  Wash your hands well to avoid spreading bacteria and viruses.  Only take over-the-counter or prescription medicines for pain, discomfort, or fever as directed by your caregiver.  Ask your caregiver if you should continue all prescribed and over-the-counter medicines.  Keep all follow-up appointments with your caregiver. SEEK MEDICAL CARE IF:  You have abdominal pain and it increases or stays in one area (localizes).  You have a rash, stiff neck, or severe headache.  You are irritable, sleepy, or difficult to awaken.  You are weak, dizzy, or extremely thirsty. SEEK IMMEDIATE MEDICAL CARE IF:   You are unable to keep fluids down or you get worse despite treatment.  You have frequent episodes of vomiting or diarrhea.  You have blood or green matter (bile) in your vomit.  You have blood in your stool or your stool looks black and tarry.  You have not urinated in 6 to 8 hours, or you have only urinated a small amount of very dark urine.  You have a fever.  You faint. MAKE SURE YOU:   Understand these instructions.  Will watch your condition.  Will get help right away if you are not doing well or get worse. Document Released: 01/29/2005 Document Revised: 04/23/2011 Document Reviewed: 09/18/2010 ExitCare Patient Information 2015 [employer]. This information is not intended to replace advice given to you by your health care provider. Make sure you discuss any questions you have with your health care   provider.  

## 2013-12-04 ENCOUNTER — Telehealth: Payer: Self-pay | Admitting: *Deleted

## 2013-12-04 NOTE — Telephone Encounter (Signed)
Per [facility], pt needs lab work done 10/27.  CBC,CMET,Magnesium.  Dr Vista Mink does not need lab results.  Orders received from [facility], given to Detar North to place in the lab binder to be used on 10/27.  Pt is aware of appt.

## 2013-12-08 ENCOUNTER — Other Ambulatory Visit: Payer: Medicare Other

## 2013-12-09 ENCOUNTER — Other Ambulatory Visit: Payer: Self-pay | Admitting: Dermatology

## 2013-12-11 ENCOUNTER — Other Ambulatory Visit: Payer: Self-pay | Admitting: Medical Oncology

## 2013-12-11 DIAGNOSIS — Z9481 Bone marrow transplant status: Secondary | ICD-10-CM

## 2013-12-11 DIAGNOSIS — D473 Essential (hemorrhagic) thrombocythemia: Secondary | ICD-10-CM

## 2013-12-16 ENCOUNTER — Other Ambulatory Visit (HOSPITAL_BASED_OUTPATIENT_CLINIC_OR_DEPARTMENT_OTHER): Payer: Medicare Other

## 2013-12-16 DIAGNOSIS — D473 Essential (hemorrhagic) thrombocythemia: Secondary | ICD-10-CM

## 2013-12-16 DIAGNOSIS — Z9481 Bone marrow transplant status: Secondary | ICD-10-CM

## 2013-12-16 DIAGNOSIS — D7581 Myelofibrosis: Secondary | ICD-10-CM

## 2013-12-16 LAB — CBC WITH DIFFERENTIAL/PLATELET
BASO%: 0.4 % (ref 0.0–2.0)
Basophils Absolute: 0 10*3/uL (ref 0.0–0.1)
EOS%: 3.2 % (ref 0.0–7.0)
Eosinophils Absolute: 0.1 10*3/uL (ref 0.0–0.5)
HEMATOCRIT: 26.2 % — AB (ref 38.4–49.9)
HEMOGLOBIN: 8.7 g/dL — AB (ref 13.0–17.1)
LYMPH%: 10.3 % — ABNORMAL LOW (ref 14.0–49.0)
MCH: 34.4 pg — ABNORMAL HIGH (ref 27.2–33.4)
MCHC: 33 g/dL (ref 32.0–36.0)
MCV: 104.2 fL — ABNORMAL HIGH (ref 79.3–98.0)
MONO#: 0.3 10*3/uL (ref 0.1–0.9)
MONO%: 8.9 % (ref 0.0–14.0)
NEUT#: 2.3 10*3/uL (ref 1.5–6.5)
NEUT%: 77.2 % — AB (ref 39.0–75.0)
Platelets: 104 10*3/uL — ABNORMAL LOW (ref 140–400)
RBC: 2.52 10*6/uL — ABNORMAL LOW (ref 4.20–5.82)
RDW: 18.6 % — ABNORMAL HIGH (ref 11.0–14.6)
WBC: 2.9 10*3/uL — AB (ref 4.0–10.3)
lymph#: 0.3 10*3/uL — ABNORMAL LOW (ref 0.9–3.3)

## 2013-12-16 LAB — COMPREHENSIVE METABOLIC PANEL (CC13)
ALT: 8 U/L (ref 0–55)
ANION GAP: 8 meq/L (ref 3–11)
AST: 14 U/L (ref 5–34)
Albumin: 3.6 g/dL (ref 3.5–5.0)
Alkaline Phosphatase: 85 U/L (ref 40–150)
BUN: 28 mg/dL — ABNORMAL HIGH (ref 7.0–26.0)
CO2: 27 mEq/L (ref 22–29)
CREATININE: 1.6 mg/dL — AB (ref 0.7–1.3)
Calcium: 9.1 mg/dL (ref 8.4–10.4)
Chloride: 109 mEq/L (ref 98–109)
Glucose: 101 mg/dl (ref 70–140)
Potassium: 4.2 mEq/L (ref 3.5–5.1)
Sodium: 144 mEq/L (ref 136–145)
TOTAL PROTEIN: 5.8 g/dL — AB (ref 6.4–8.3)
Total Bilirubin: 0.61 mg/dL (ref 0.20–1.20)

## 2013-12-16 LAB — ALKALINE PHOSPHATASE: Alkaline Phosphatase: 84 U/L (ref 39–117)

## 2013-12-16 LAB — MAGNESIUM (CC13): Magnesium: 2.4 mg/dl (ref 1.5–2.5)

## 2014-02-02 ENCOUNTER — Telehealth: Payer: Self-pay

## 2014-02-02 NOTE — Telephone Encounter (Signed)
Pt stated that he has not had a flu shot this year. 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

## 2014-06-03 ENCOUNTER — Telehealth: Payer: Self-pay | Admitting: Endocrinology

## 2014-06-03 NOTE — Telephone Encounter (Signed)
Received medical records from [facility]. Sent to Dr. Loanne Drilling. 06/03/14/ss

## 2014-10-08 ENCOUNTER — Emergency Department (HOSPITAL_COMMUNITY)
Admission: EM | Admit: 2014-10-08 | Discharge: 2014-10-09 | Disposition: A | Discharge status: Other Acute Inpatient Hospital | Payer: Medicare Other | Attending: Emergency Medicine | Admitting: Emergency Medicine

## 2014-10-08 ENCOUNTER — Encounter (HOSPITAL_COMMUNITY): Payer: Self-pay | Admitting: Emergency Medicine

## 2014-10-08 ENCOUNTER — Emergency Department (HOSPITAL_COMMUNITY): Payer: Medicare Other

## 2014-10-08 DIAGNOSIS — R011 Cardiac murmur, unspecified: Secondary | ICD-10-CM | POA: Diagnosis not present

## 2014-10-08 DIAGNOSIS — K219 Gastro-esophageal reflux disease without esophagitis: Secondary | ICD-10-CM | POA: Diagnosis not present

## 2014-10-08 DIAGNOSIS — I10 Essential (primary) hypertension: Secondary | ICD-10-CM | POA: Insufficient documentation

## 2014-10-08 DIAGNOSIS — Z9481 Bone marrow transplant status: Secondary | ICD-10-CM | POA: Diagnosis not present

## 2014-10-08 DIAGNOSIS — R509 Fever, unspecified: Secondary | ICD-10-CM | POA: Insufficient documentation

## 2014-10-08 DIAGNOSIS — Z79899 Other long term (current) drug therapy: Secondary | ICD-10-CM | POA: Insufficient documentation

## 2014-10-08 DIAGNOSIS — Z792 Long term (current) use of antibiotics: Secondary | ICD-10-CM | POA: Diagnosis not present

## 2014-10-08 DIAGNOSIS — Z862 Personal history of diseases of the blood and blood-forming organs and certain disorders involving the immune mechanism: Secondary | ICD-10-CM | POA: Insufficient documentation

## 2014-10-08 DIAGNOSIS — R110 Nausea: Secondary | ICD-10-CM | POA: Insufficient documentation

## 2014-10-08 DIAGNOSIS — R231 Pallor: Secondary | ICD-10-CM | POA: Insufficient documentation

## 2014-10-08 DIAGNOSIS — R21 Rash and other nonspecific skin eruption: Secondary | ICD-10-CM | POA: Diagnosis not present

## 2014-10-08 NOTE — ED Notes (Signed)
Pt from  Home c/o fever and rash since today. 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 Pt reports bilateral hip aching. Fever was 100.8 at home.

## 2014-10-09 DIAGNOSIS — R509 Fever, unspecified: Secondary | ICD-10-CM | POA: Diagnosis present

## 2014-10-09 DIAGNOSIS — I10 Essential (primary) hypertension: Secondary | ICD-10-CM | POA: Diagnosis not present

## 2014-10-09 DIAGNOSIS — Z862 Personal history of diseases of the blood and blood-forming organs and certain disorders involving the immune mechanism: Secondary | ICD-10-CM | POA: Diagnosis not present

## 2014-10-09 DIAGNOSIS — R110 Nausea: Secondary | ICD-10-CM | POA: Diagnosis not present

## 2014-10-09 DIAGNOSIS — R231 Pallor: Secondary | ICD-10-CM | POA: Diagnosis not present

## 2014-10-09 DIAGNOSIS — Z792 Long term (current) use of antibiotics: Secondary | ICD-10-CM | POA: Diagnosis not present

## 2014-10-09 DIAGNOSIS — Z79899 Other long term (current) drug therapy: Secondary | ICD-10-CM | POA: Diagnosis not present

## 2014-10-09 DIAGNOSIS — R011 Cardiac murmur, unspecified: Secondary | ICD-10-CM | POA: Diagnosis not present

## 2014-10-09 DIAGNOSIS — R21 Rash and other nonspecific skin eruption: Secondary | ICD-10-CM | POA: Diagnosis not present

## 2014-10-09 DIAGNOSIS — Z9481 Bone marrow transplant status: Secondary | ICD-10-CM | POA: Diagnosis not present

## 2014-10-09 DIAGNOSIS — K219 Gastro-esophageal reflux disease without esophagitis: Secondary | ICD-10-CM | POA: Diagnosis not present

## 2014-10-09 LAB — CBC WITH DIFFERENTIAL/PLATELET
BASOS ABS: 0 10*3/uL (ref 0.0–0.1)
Basophils Relative: 1 % (ref 0–1)
EOS ABS: 0.1 10*3/uL (ref 0.0–0.7)
Eosinophils Relative: 3 % (ref 0–5)
HCT: 30.2 % — ABNORMAL LOW (ref 39.0–52.0)
Hemoglobin: 9.7 g/dL — ABNORMAL LOW (ref 13.0–17.0)
LYMPHS PCT: 32 % (ref 12–46)
Lymphs Abs: 0.9 10*3/uL (ref 0.7–4.0)
MCH: 28.4 pg (ref 26.0–34.0)
MCHC: 32.1 g/dL (ref 30.0–36.0)
MCV: 88.6 fL (ref 78.0–100.0)
Monocytes Absolute: 0.5 10*3/uL (ref 0.1–1.0)
Monocytes Relative: 17 % — ABNORMAL HIGH (ref 3–12)
NEUTROS PCT: 47 % (ref 43–77)
Neutro Abs: 1.2 10*3/uL — ABNORMAL LOW (ref 1.7–7.7)
PLATELETS: 170 10*3/uL (ref 150–400)
RBC: 3.41 MIL/uL — AB (ref 4.22–5.81)
RDW: 15.6 % — ABNORMAL HIGH (ref 11.5–15.5)
WBC: 2.7 10*3/uL — ABNORMAL LOW (ref 4.0–10.5)

## 2014-10-09 LAB — COMPREHENSIVE METABOLIC PANEL
ALBUMIN: 3.6 g/dL (ref 3.5–5.0)
ALT: 103 U/L — ABNORMAL HIGH (ref 17–63)
ANION GAP: 5 (ref 5–15)
AST: 93 U/L — ABNORMAL HIGH (ref 15–41)
Alkaline Phosphatase: 94 U/L (ref 38–126)
BILIRUBIN TOTAL: 0.4 mg/dL (ref 0.3–1.2)
BUN: 33 mg/dL — ABNORMAL HIGH (ref 6–20)
CHLORIDE: 105 mmol/L (ref 101–111)
CO2: 25 mmol/L (ref 22–32)
Calcium: 8.3 mg/dL — ABNORMAL LOW (ref 8.9–10.3)
Creatinine, Ser: 1.89 mg/dL — ABNORMAL HIGH (ref 0.61–1.24)
GFR calc Af Amer: 40 mL/min — ABNORMAL LOW (ref >60)
GFR calc non Af Amer: 34 mL/min — ABNORMAL LOW (ref >60)
GLUCOSE: 122 mg/dL — AB (ref 65–99)
POTASSIUM: 4.6 mmol/L (ref 3.5–5.1)
Sodium: 135 mmol/L (ref 135–145)
TOTAL PROTEIN: 6 g/dL — AB (ref 6.5–8.1)

## 2014-10-09 LAB — URINALYSIS, ROUTINE W REFLEX MICROSCOPIC
Bilirubin Urine: NEGATIVE
Glucose, UA: NEGATIVE mg/dL
Hgb urine dipstick: NEGATIVE
KETONES UR: NEGATIVE mg/dL
LEUKOCYTES UA: NEGATIVE
NITRITE: NEGATIVE
PROTEIN: NEGATIVE mg/dL
Specific Gravity, Urine: 1.017 (ref 1.005–1.030)
Urobilinogen, UA: 0.2 mg/dL (ref 0.0–1.0)
pH: 7 (ref 5.0–8.0)

## 2014-10-09 LAB — I-STAT CG4 LACTIC ACID, ED: LACTIC ACID, VENOUS: 0.82 mmol/L (ref 0.5–2.0)

## 2014-10-09 MED ORDER — DEXTROSE 5 % IV SOLN
2.0000 g | Freq: Once | INTRAVENOUS | Status: AC
  Administered 2014-10-09: 2 g via INTRAVENOUS
  Filled 2014-10-09: qty 2

## 2014-10-09 MED ORDER — IBUPROFEN 200 MG PO TABS
600.0000 mg | ORAL_TABLET | Freq: Once | ORAL | Status: AC
  Administered 2014-10-09: 600 mg via ORAL
  Filled 2014-10-09: qty 3

## 2014-10-09 NOTE — ED Provider Notes (Signed)
CSN: [id number]     Arrival date & time 10/08/14  2251 History  This chart was scribed for Thomas Flemings, MD by Julien Nordmann, ED Scribe. This patient was seen in room WA24/WA24 and the patient's care was started at 12:07 AM.    Chief Complaint  Patient presents with  . Fever  . Rash      The history is provided by the patient. No language interpreter was used.   HPI Comments: Thomas Cherry is a 69 y.o. male who presents to the Emergency Department complaining of a sudden onset, gradual worsening fever onset today. Pt has an associated nausea and rash on his arms and back. Pt had a bone marrow transplant a few months ago and reports having a DLI. He was told by his doctor to come to the ED if his temperature was higher than 100. Pt denies cough, diarrhea, vomiting, abdominal pain, chest pain, and shortness of breath.  Past Medical History  Diagnosis Date  . GERD (gastroesophageal reflux disease)   . Allergy     heyfever  . Hypertension   . Palpitation   . Elevated platelet count     on hydroxyurea  . Heart murmur    Past Surgical History  Procedure Laterality Date  . Hernia repair     Family History  Problem Relation Age of Onset  . Ovarian cancer    . Uterine cancer    . Colitis      crohns  . Cancer Mother   . Cancer Father   . Transient ischemic attack Sister     Aortic valve replacement  . Colon cancer Neg Hx    Social History  Substance Use Topics  . Smoking status: Never Smoker   . Smokeless tobacco: Never Used  . Alcohol Use: No    Review of Systems  Constitutional: Positive for fever.  Respiratory: Negative for cough and shortness of breath.   Cardiovascular: Negative for chest pain.  Gastrointestinal: Positive for nausea. Negative for vomiting, abdominal pain and diarrhea.  Skin: Positive for pallor.  All other systems reviewed and are negative.     Allergies  Review of patient's allergies indicates no known allergies.  Home Medications    Prior to Admission medications   Medication Sig Start Date End Date Taking? Authorizing Provider  clobetasol cream (TEMOVATE) 0.93 % Apply 1 application topically 2 (two) times daily as needed (rash).   Yes Historical Provider, MD  esomeprazole (NEXIUM) 20 MG capsule Take 20 mg by mouth daily at 12 noon.   Yes Historical Provider, MD  ibuprofen (ADVIL,MOTRIN) 200 MG tablet Take 200 mg by mouth every 6 (six) hours as needed for moderate pain.   Yes Historical Provider, MD  levofloxacin (LEVAQUIN) 500 MG tablet Take 500 mg by mouth daily. 09/01/14  Yes Historical Provider, MD  solifenacin (VESICARE) 5 MG tablet Take 10 mg by mouth daily.   Yes Historical Provider, MD  tamsulosin (FLOMAX) 0.4 MG CAPS capsule Take 0.4 mg by mouth daily.   Yes Historical Provider, MD  valACYclovir (VALTREX) 500 MG tablet Take 500 mg by mouth 2 (two) times daily.   Yes Historical Provider, MD  diltiazem (CARDIZEM CD) 180 MG 24 hr capsule TAKE (1) CAPSULE DAILY BY MOUTH Patient not taking: Reported on 10/08/2014 01/26/13   Renato Shin, MD  folic acid (FOLVITE) 1 MG tablet Take 1 mg by mouth daily.    Historical Provider, MD  losartan-hydrochlorothiazide (HYZAAR) 100-25 MG per tablet TAKE (1/2) TABLET DAILY  Patient not taking: Reported on 10/08/2014 06/05/13   Renato Shin, MD  losartan-hydrochlorothiazide [facility]) 50-12.5 MG per tablet TAKE 1/2 TABLET ONCE DAILY Patient not taking: Reported on 10/08/2014 01/12/13   Renato Shin, MD   Triage vitals: BP 124/83 mmHg  Pulse 102  Temp(Src) 98.1 F (36.7 C) (Oral)  Resp 18  Wt 137 lb (62.143 kg)  SpO2 99% Physical Exam  Constitutional: He is oriented to person, place, and time. He appears well-developed and well-nourished.  HENT:  Head: Normocephalic and atraumatic.  Nose: Nose normal.  Mouth/Throat: Oropharynx is clear and moist.  Eyes: Conjunctivae and EOM are normal. Pupils are equal, round, and reactive to light.  Neck: Normal range of motion. Neck supple. No  JVD present. No tracheal deviation present. No thyromegaly present.  Cardiovascular: Normal rate, regular rhythm, normal heart sounds and intact distal pulses.  Exam reveals no gallop and no friction rub.   No murmur heard. Pulmonary/Chest: Effort normal and breath sounds normal. No stridor. No respiratory distress. He has no wheezes. He has no rales. He exhibits no tenderness.  Abdominal: Soft. Bowel sounds are normal. He exhibits no distension and no mass. There is no tenderness. There is no rebound and no guarding.  Musculoskeletal: Normal range of motion. He exhibits no edema or tenderness.  Lymphadenopathy:    He has no cervical adenopathy.  Neurological: He is alert and oriented to person, place, and time. He displays normal reflexes. He exhibits normal muscle tone. Coordination normal.  Skin: Skin is warm and dry. Rash (fine reticular rash, blanching over arms, legs, trunk) noted. No erythema. No pallor.  Psychiatric: He has a normal mood and affect. His behavior is normal. Judgment and thought content normal.  Nursing note and vitals reviewed.   ED Course  Procedures  DIAGNOSTIC STUDIES: Oxygen Saturation is 99% on RA, normal by my interpretation.  COORDINATION OF CARE:  12:11 AM Discussed treatment plan which includes lab work with pt at bedside and pt agreed to plan.  Labs Review Labs Reviewed  COMPREHENSIVE METABOLIC PANEL - Abnormal; Notable for the following:    Glucose, Bld 122 (*)    BUN 33 (*)    Creatinine, Ser 1.89 (*)    Calcium 8.3 (*)    Total Protein 6.0 (*)    AST 93 (*)    ALT 103 (*)    GFR calc non Af Amer 34 (*)    GFR calc Af Amer 40 (*)    All other components within normal limits  CBC WITH DIFFERENTIAL/PLATELET - Abnormal; Notable for the following:    WBC 2.7 (*)    RBC 3.41 (*)    Hemoglobin 9.7 (*)    HCT 30.2 (*)    RDW 15.6 (*)    Monocytes Relative 17 (*)    Neutro Abs 1.2 (*)    All other components within normal limits  URINE CULTURE   CULTURE, BLOOD (ROUTINE X 2)  CULTURE, BLOOD (ROUTINE X 2)  URINALYSIS, ROUTINE W REFLEX MICROSCOPIC (NOT AT [facility])  I-STAT CG4 LACTIC ACID, ED  I-STAT CG4 LACTIC ACID, ED    Imaging Review Dg Chest 2 View  10/08/2014   CLINICAL DATA:  Fever and rash today, 400 days post bone marrow transplant, history hypertension, GERD  EXAM: CHEST  2 VIEW  COMPARISON:  None  FINDINGS: Upper normal heart size.  Mediastinal contours and pulmonary vascularity normal.  Bronchitic changes without infiltrate, pleural effusion or pneumothorax.  Minimal elevation of LEFT diaphragm.  Subtle area  of sclerosis at the inferior margin of the posterior LEFT fifth rib, uncertain acuity.  No additional focal osseous abnormalities.  IMPRESSION: Mild bronchitic changes.  Subtle area of sclerosis at posterior LEFT fifth rib, uncertain acuity; recommend comparison to prior outside chest radiographs to determine stability.   Electronically Signed   By: Lavonia Dana M.D.   On: 10/08/2014 23:34   I have personally reviewed and evaluated these images and lab results as part of my medical decision-making.   EKG Interpretation None      MDM   Final diagnoses:  Fever, unspecified fever cause  History of bone marrow transplant   I personally performed the services described in this documentation, which was scribed in my presence. The recorded information has been reviewed and is accurate.  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  Temp at home 100.8, afebrile here.  Patient with lacy fine reticular rash.  Plan for labs, will discuss with his bone marrow transplant team at [facility].  Case d/w Dr Chelsea Primus who will discuss case with BMT physician.  Dr. Chelsea Primus has called back, recommends two grams of cefepime and transfer to [facility].  Case was discussed with EDP at [facility], Dr. Barbara Cower.  Family updated on plan.   Thomas Flemings, MD 10/09/14 [phone]

## 2014-10-09 NOTE — ED Notes (Signed)
Pt and wife are still requesting an update from MD.  Notified next shift nurse.

## 2014-10-09 NOTE — ED Notes (Signed)
Pt and wife are requesting status update; MD [facility] notified.  She is currently on the phone with [facility] gathering more information about pt history. Pt is in no acute distress at this time.

## 2014-10-10 LAB — URINE CULTURE: Culture: NO GROWTH

## 2014-10-14 LAB — CULTURE, BLOOD (ROUTINE X 2)
CULTURE: NO GROWTH
Culture: NO GROWTH

## 2014-10-15 ENCOUNTER — Telehealth: Payer: Self-pay | Admitting: *Deleted

## 2014-10-15 NOTE — Telephone Encounter (Signed)
Bone marrow transplant completed 09/02/13 at [facility]. Pt Follows up with [facility] for all transplant visits. Pt last seen  Pt's creatine is elevated; Shirlee Limerick, RN at [facility] is requesting pt have fluids on Saturday 10/16/14. Discussed with Shirlee Limerick, MD out of office today. Shirlee Limerick advised pt was not willing to stay at [facility] today for extra fluids and unable to come back Saturday as their clinics are full. Informed Shirlee Limerick we do not have any current records on patients from [facility] with updates on pt care nor labs. Will page MD for further instructions.

## 2014-10-29 ENCOUNTER — Telehealth: Payer: Self-pay | Admitting: *Deleted

## 2014-10-29 ENCOUNTER — Ambulatory Visit: Payer: Medicare Other

## 2014-10-29 ENCOUNTER — Telehealth: Payer: Self-pay | Admitting: Medical Oncology

## 2014-10-29 NOTE — Telephone Encounter (Signed)
FYI-I called pt and we de-accessed his port.

## 2014-10-29 NOTE — Progress Notes (Addendum)
Pt in for port needle to be deaccessed, was accessed at different facility today. Pt port was covered with 2x2 gauze and paper tape. Port doesn't appear red or edematous at this time. Pt denies any pain and states it was only accessed for lab this morning and was flushed with saline and heparin. Heparin flush confirmed with [facility] by Abelina Bachelor, RN. Pt port needle deaccessed at this time. Pt educated on port safety.

## 2014-10-29 NOTE — Telephone Encounter (Signed)
Voicemail from patient.  "I was seen in [facility] today.  They left line in me from port.  Can I come to [facility] for someone to de-access me?"  Return number [phone].  Seen last at  [facility] 07-27-2013.

## 2014-10-29 NOTE — Telephone Encounter (Signed)
Huber needle left in inadvertently at [facility] today and Pt called and requested that he come in and have the needle removed. "They put heparin in it already"-Appt given for today. I spoke to Katie at [facility] BMT and she said the port was heparinized and  to please deaccess port.

## 2014-12-30 ENCOUNTER — Telehealth: Payer: Self-pay | Admitting: *Deleted

## 2014-12-30 NOTE — Telephone Encounter (Signed)
Message off VM from [facility] at [facility] unit. Pt was recently hospitalized for hip fx and surgery. [facility] requesting Labs every Tuesday while pt recovers at home. Message will be routed to MD for review.

## 2014-12-30 NOTE — Telephone Encounter (Signed)
Ok to order the lab requested

## 2014-12-31 ENCOUNTER — Other Ambulatory Visit: Payer: Self-pay | Admitting: Medical Oncology

## 2014-12-31 ENCOUNTER — Telehealth: Payer: Self-pay | Admitting: Internal Medicine

## 2014-12-31 DIAGNOSIS — D473 Essential (hemorrhagic) thrombocythemia: Secondary | ICD-10-CM

## 2014-12-31 NOTE — Telephone Encounter (Signed)
s.w. pt wife and advised on weekly labs

## 2015-01-03 NOTE — Telephone Encounter (Signed)
LMOVM for Suanne Marker, RN at [facility] ok for labs to be drawn here on tuesdays. Requested lab order be faxed.

## 2015-01-04 ENCOUNTER — Other Ambulatory Visit: Payer: Medicare Other

## 2015-01-11 ENCOUNTER — Other Ambulatory Visit: Payer: Medicare Other

## 2015-01-14 ENCOUNTER — Telehealth: Payer: Self-pay | Admitting: Internal Medicine

## 2015-01-14 NOTE — Telephone Encounter (Signed)
returned call and confirmed that i cx all labsdue to pt having all labs done at [facility]

## 2015-01-18 ENCOUNTER — Other Ambulatory Visit: Payer: Medicare Other

## 2015-01-23 ENCOUNTER — Encounter (HOSPITAL_COMMUNITY): Payer: Self-pay | Admitting: Emergency Medicine

## 2015-01-23 ENCOUNTER — Emergency Department (HOSPITAL_COMMUNITY): Payer: Medicare Other

## 2015-01-23 ENCOUNTER — Observation Stay (HOSPITAL_COMMUNITY)
Admission: EM | Admit: 2015-01-23 | Discharge: 2015-01-24 | Disposition: A | Discharge status: Home / Self Care | Payer: Medicare Other | Attending: Internal Medicine | Admitting: Internal Medicine

## 2015-01-23 DIAGNOSIS — Z952 Presence of prosthetic heart valve: Secondary | ICD-10-CM | POA: Insufficient documentation

## 2015-01-23 DIAGNOSIS — Z79899 Other long term (current) drug therapy: Secondary | ICD-10-CM | POA: Insufficient documentation

## 2015-01-23 DIAGNOSIS — D473 Essential (hemorrhagic) thrombocythemia: Secondary | ICD-10-CM | POA: Diagnosis not present

## 2015-01-23 DIAGNOSIS — Z792 Long term (current) use of antibiotics: Secondary | ICD-10-CM | POA: Insufficient documentation

## 2015-01-23 DIAGNOSIS — C946 Myelodysplastic disease, not classified: Secondary | ICD-10-CM | POA: Insufficient documentation

## 2015-01-23 DIAGNOSIS — K219 Gastro-esophageal reflux disease without esophagitis: Secondary | ICD-10-CM | POA: Diagnosis not present

## 2015-01-23 DIAGNOSIS — Z96642 Presence of left artificial hip joint: Secondary | ICD-10-CM | POA: Insufficient documentation

## 2015-01-23 DIAGNOSIS — S73005S Unspecified dislocation of left hip, sequela: Secondary | ICD-10-CM

## 2015-01-23 DIAGNOSIS — I10 Essential (primary) hypertension: Secondary | ICD-10-CM | POA: Diagnosis present

## 2015-01-23 DIAGNOSIS — D7581 Myelofibrosis: Secondary | ICD-10-CM | POA: Diagnosis present

## 2015-01-23 DIAGNOSIS — W19XXXA Unspecified fall, initial encounter: Secondary | ICD-10-CM | POA: Diagnosis not present

## 2015-01-23 DIAGNOSIS — N400 Enlarged prostate without lower urinary tract symptoms: Secondary | ICD-10-CM | POA: Insufficient documentation

## 2015-01-23 DIAGNOSIS — S73005A Unspecified dislocation of left hip, initial encounter: Secondary | ICD-10-CM | POA: Diagnosis not present

## 2015-01-23 DIAGNOSIS — Z9481 Bone marrow transplant status: Secondary | ICD-10-CM | POA: Insufficient documentation

## 2015-01-23 DIAGNOSIS — Z79891 Long term (current) use of opiate analgesic: Secondary | ICD-10-CM | POA: Diagnosis not present

## 2015-01-23 DIAGNOSIS — D471 Chronic myeloproliferative disease: Secondary | ICD-10-CM | POA: Diagnosis present

## 2015-01-23 DIAGNOSIS — M25552 Pain in left hip: Secondary | ICD-10-CM | POA: Diagnosis present

## 2015-01-23 LAB — CBC WITH DIFFERENTIAL/PLATELET
Basophils Absolute: 0 10*3/uL (ref 0.0–0.1)
Basophils Relative: 0 %
EOS PCT: 0 %
Eosinophils Absolute: 0 10*3/uL (ref 0.0–0.7)
HEMATOCRIT: 29.2 % — AB (ref 39.0–52.0)
Hemoglobin: 9.4 g/dL — ABNORMAL LOW (ref 13.0–17.0)
LYMPHS ABS: 0.4 10*3/uL — AB (ref 0.7–4.0)
Lymphocytes Relative: 8 %
MCH: 32.9 pg (ref 26.0–34.0)
MCHC: 32.2 g/dL (ref 30.0–36.0)
MCV: 102.1 fL — AB (ref 78.0–100.0)
MONO ABS: 0.5 10*3/uL (ref 0.1–1.0)
MONOS PCT: 9 %
NEUTROS ABS: 4.7 10*3/uL (ref 1.7–7.7)
Neutrophils Relative %: 83 %
Platelets: 62 10*3/uL — ABNORMAL LOW (ref 150–400)
RBC: 2.86 MIL/uL — AB (ref 4.22–5.81)
RDW: 16.9 % — ABNORMAL HIGH (ref 11.5–15.5)
WBC: 5.6 10*3/uL (ref 4.0–10.5)

## 2015-01-23 LAB — COMPREHENSIVE METABOLIC PANEL
ALBUMIN: 2.9 g/dL — AB (ref 3.5–5.0)
ALK PHOS: 75 U/L (ref 38–126)
ALT: 22 U/L (ref 17–63)
AST: 28 U/L (ref 15–41)
Anion gap: 7 (ref 5–15)
BILIRUBIN TOTAL: 0.4 mg/dL (ref 0.3–1.2)
BUN: 36 mg/dL — AB (ref 6–20)
CO2: 24 mmol/L (ref 22–32)
CREATININE: 1.96 mg/dL — AB (ref 0.61–1.24)
Calcium: 8.2 mg/dL — ABNORMAL LOW (ref 8.9–10.3)
Chloride: 111 mmol/L (ref 101–111)
GFR calc Af Amer: 38 mL/min — ABNORMAL LOW (ref >60)
GFR calc non Af Amer: 33 mL/min — ABNORMAL LOW (ref >60)
GLUCOSE: 165 mg/dL — AB (ref 65–99)
POTASSIUM: 4.4 mmol/L (ref 3.5–5.1)
Sodium: 142 mmol/L (ref 135–145)
TOTAL PROTEIN: 4.7 g/dL — AB (ref 6.5–8.1)

## 2015-01-23 MED ORDER — TACROLIMUS 0.5 MG PO CAPS
0.5000 mg | ORAL_CAPSULE | Freq: Every day | ORAL | Status: DC
  Filled 2015-01-23: qty 1

## 2015-01-23 MED ORDER — TACROLIMUS 1 MG PO CAPS
1.0000 mg | ORAL_CAPSULE | Freq: Every day | ORAL | Status: DC
  Filled 2015-01-23 (×2): qty 1

## 2015-01-23 MED ORDER — SODIUM CHLORIDE 0.9 % IV BOLUS (SEPSIS)
1000.0000 mL | Freq: Once | INTRAVENOUS | Status: AC
  Administered 2015-01-23: 1000 mL via INTRAVENOUS

## 2015-01-23 MED ORDER — DARIFENACIN HYDROBROMIDE ER 15 MG PO TB24
15.0000 mg | ORAL_TABLET | Freq: Every day | ORAL | Status: DC
  Filled 2015-01-23 (×2): qty 1

## 2015-01-23 MED ORDER — VALACYCLOVIR HCL 500 MG PO TABS
500.0000 mg | ORAL_TABLET | Freq: Every day | ORAL | Status: DC
  Filled 2015-01-23: qty 1

## 2015-01-23 MED ORDER — SODIUM CHLORIDE 0.9 % IV SOLN: INTRAVENOUS | Status: DC

## 2015-01-23 MED ORDER — PREDNISONE 5 MG PO TABS
15.0000 mg | ORAL_TABLET | Freq: Every day | ORAL | Status: DC
  Administered 2015-01-24: 15 mg via ORAL
  Filled 2015-01-23 (×2): qty 1

## 2015-01-23 MED ORDER — ONDANSETRON HCL 4 MG PO TABS: 4.0000 mg | ORAL_TABLET | Freq: Four times a day (QID) | ORAL | Status: DC | PRN

## 2015-01-23 MED ORDER — TACROLIMUS 0.5 MG PO CAPS
1.0000 mg | ORAL_CAPSULE | Freq: Every day | ORAL | Status: DC
  Filled 2015-01-23: qty 2

## 2015-01-23 MED ORDER — ONDANSETRON HCL 4 MG/2ML IJ SOLN: 4.0000 mg | Freq: Four times a day (QID) | INTRAMUSCULAR | Status: DC | PRN

## 2015-01-23 MED ORDER — HYDROMORPHONE HCL 1 MG/ML IJ SOLN
1.0000 mg | Freq: Once | INTRAMUSCULAR | Status: AC
Start: 2015-01-23 — End: 2015-01-23
  Administered 2015-01-23: 1 mg via INTRAVENOUS
  Filled 2015-01-23: qty 1

## 2015-01-23 MED ORDER — PROPOFOL 10 MG/ML IV BOLUS
1.0000 mg/kg | Freq: Once | INTRAVENOUS | Status: AC
  Administered 2015-01-23: 64.9 mg via INTRAVENOUS
  Filled 2015-01-23: qty 1

## 2015-01-23 MED ORDER — PANTOPRAZOLE SODIUM 40 MG PO TBEC
40.0000 mg | DELAYED_RELEASE_TABLET | Freq: Every day | ORAL | Status: DC
Start: 2015-01-24 — End: 2015-01-24
  Administered 2015-01-24: 40 mg via ORAL
  Filled 2015-01-23 (×2): qty 1

## 2015-01-23 MED ORDER — DAPSONE 100 MG PO TABS
100.0000 mg | ORAL_TABLET | Freq: Every day | ORAL | Status: DC
  Filled 2015-01-23 (×2): qty 1

## 2015-01-23 MED ORDER — POSACONAZOLE 100 MG PO TBEC
400.0000 mg | DELAYED_RELEASE_TABLET | Freq: Every day | ORAL | Status: DC
  Filled 2015-01-23 (×2): qty 4

## 2015-01-23 MED ORDER — OXYCODONE HCL 5 MG PO TABS: 5.0000 mg | ORAL_TABLET | Freq: Every day | ORAL | Status: DC | PRN

## 2015-01-23 MED ORDER — PROPOFOL 10 MG/ML IV BOLUS: 1.0000 mg/kg | Freq: Once | INTRAVENOUS | Status: DC

## 2015-01-23 MED ORDER — LEVOFLOXACIN 500 MG PO TABS
500.0000 mg | ORAL_TABLET | Freq: Every day | ORAL | Status: DC
  Filled 2015-01-23: qty 1

## 2015-01-23 MED ORDER — TAMSULOSIN HCL 0.4 MG PO CAPS
0.4000 mg | ORAL_CAPSULE | Freq: Every day | ORAL | Status: DC
  Filled 2015-01-23 (×2): qty 1

## 2015-01-23 NOTE — ED Notes (Signed)
Per EMS: Pt has recent hip replacement of lt side 1 month ago.  Pt was trying to get on the toilet, going from standing to sitting and felt his lt hip pop out of place.  Pt has shortening and rotation of lt side.  Was given total of 250 mcg fentanyl en route.  A&O x 4.

## 2015-01-23 NOTE — ED Notes (Signed)
ETCO2, pulse ox, monitor placed on pt.  Ambu bag, suction,informed consent at bedside.  Awaiting Dr. Percell Miller and ortho tech for conscious sedation.

## 2015-01-23 NOTE — Consult Note (Signed)
ORTHOPAEDIC CONSULTATION  REQUESTING PHYSICIAN: Gareth Morgan, MD  Chief Complaint: left hip dislocation  HPI: Thomas Cherry is a 69 y.o. male who complains of a mechanical fall while in the bathroom. He is status post a left hip hemiarthroplasty for fracture. This surgery was done at [facility] roughly a month ago by Dr. Lyndel Pleasure. He also has a relatively recent history of a bone marrow transplant and was taking prednisone for immune suppression prior to his femoral neck fracture. He was in his bathroom earlier this evening when he pivoted to turn and sit down he is used to a elevated toilet seat was lower than he expected in the process of this he fell to the ground and has had pain about his hip with shortening of his leg ever since.  Past Medical History  Diagnosis Date  . GERD (gastroesophageal reflux disease)   . Allergy     heyfever  . Hypertension   . Palpitation   . Elevated platelet count (HCC)     on hydroxyurea  . Heart murmur    Past Surgical History  Procedure Laterality Date  . Hernia repair    . Total hip arthroplasty     Social History   Social History  . Marital Status: Married    Spouse Name: N/A  . Number of Children: 3  . Years of Education: N/A   Occupational History  . court reporter    Social History Main Topics  . Smoking status: Never Smoker   . Smokeless tobacco: Never Used  . Alcohol Use: No  . Drug Use: No  . Sexual Activity: Not Asked   Other Topics Concern  . None   Social History Narrative   Family History  Problem Relation Age of Onset  . Ovarian cancer    . Uterine cancer    . Colitis      crohns  . Cancer Mother   . Cancer Father   . Transient ischemic attack Sister     Aortic valve replacement  . Colon cancer Neg Hx    No Known Allergies Prior to Admission medications   Medication Sig Start Date End Date Taking? Authorizing Provider  amLODipine (NORVASC) 5 MG tablet Take 5 mg by mouth. 12/29/14 12/29/15 Yes  Historical Provider, MD  calcium-vitamin D (CALCIUM 500/D) 500-200 MG-UNIT tablet Take 2 tablets by mouth 2 (two) times daily. 01/04/15 2016-01-07 Yes Historical Provider, MD  clobetasol cream (TEMOVATE) 0.25 % Apply 1 application topically 2 (two) times daily as needed (rash).   Yes Historical Provider, MD  dapsone 100 MG tablet Take 100 mg by mouth. Take ONE tablet by mouth once daily on Mon,Tue,Wed,Thur and Fridays 10/26/14 01/24/15 Yes Historical Provider, MD  desonide (DESOWEN) 0.05 % cream Apply topically. 11/02/14  Yes Historical Provider, MD  docusate sodium (COLACE) 50 MG capsule Take 100 mg by mouth.   Yes Historical Provider, MD  levofloxacin (LEVAQUIN) 500 MG tablet Take 500 mg by mouth daily. 09/01/14  Yes Historical Provider, MD  oxyCODONE (OXY IR/ROXICODONE) 5 MG immediate release tablet Take 5 mg by mouth daily as needed for moderate pain (Patient takes max once daily now).  01/11/15  Yes Historical Provider, MD  pantoprazole (PROTONIX) 40 MG tablet Take 40 mg by mouth daily.   Yes Historical Provider, MD  posaconazole (NOXAFIL) 100 MG TBEC delayed-release tablet Take 400 mg by mouth. 01/20/15  Yes Historical Provider, MD  predniSONE (DELTASONE) 10 MG tablet 30m daily 01/11/15  Yes Historical Provider,  MD  PRESCRIPTION MEDICATION Take 1 application by mouth 3 (three) times daily. Saliva Substitute Combo (Biotene Dry Mouth Rinse). 12/10/14  Yes Historical Provider, MD  sodium chloride 0.9 % infusion Inject into the vein. 01/16/15  Yes Historical Provider, MD  solifenacin (VESICARE) 5 MG tablet Take 10 mg by mouth daily.   Yes Historical Provider, MD  tacrolimus (PROGRAF) 0.5 MG capsule 0.5-1 mg 2 (two) times daily. Take ONE capsule my mouth in the morning and take TWO capsules by mouth at night 01/04/15  Yes Historical Provider, MD  tamsulosin (FLOMAX) 0.4 MG CAPS capsule Take 0.4 mg by mouth daily.   Yes Historical Provider, MD  valACYclovir (VALTREX) 500 MG tablet Take 500 mg by mouth  daily. 01/11/15  Yes Historical Provider, MD  diltiazem (CARDIZEM CD) 180 MG 24 hr capsule TAKE (1) CAPSULE DAILY BY MOUTH Patient not taking: Reported on 10/08/2014 01/26/13   Renato Shin, MD  furosemide (LASIX) 20 MG tablet  01/11/15   Historical Provider, MD  losartan-hydrochlorothiazide (HYZAAR) 100-25 MG per tablet TAKE (1/2) TABLET DAILY Patient not taking: Reported on 10/08/2014 06/05/13   Renato Shin, MD  losartan-hydrochlorothiazide [facility]) 50-12.5 MG per tablet TAKE 1/2 TABLET ONCE DAILY Patient not taking: Reported on 10/08/2014 01/12/13   Renato Shin, MD   Dg Hip Unilat With Pelvis 2-3 Views Left  01/23/2015  CLINICAL DATA:  Left hip pain. Prior hemiarthroplasty. Clinical concern for dislocation. EXAM: DG HIP (WITH OR WITHOUT PELVIS) 2-3V LEFT COMPARISON:  None. FINDINGS: The left hip hemiarthroplasty has dislocated cephalad about 7.8 cm with respect to the acetabulum. No fracture or complication along the stem is identified. Bowel density projects over the scrotum, concerning for groin hernia unless the patient has a large pannus overhanging the lower pelvis. IMPRESSION: 1. Left hemi arthroplasty is dislocated about 7.8 cm proximally with respect to the acetabulum. 2. Bowel density projects over the scrotum, query groin hernia. Electronically Signed   By: Van Clines M.D.   On: 01/23/2015 17:02    Positive ROS: All other systems have been reviewed and were otherwise negative with the exception of those mentioned in the HPI and as above.  Labs cbc No results for input(s): WBC, HGB, HCT, PLT in the last 72 hours.  Labs inflam No results for input(s): CRP in the last 72 hours.  Invalid input(s): ESR  Labs coag No results for input(s): INR, PTT in the last 72 hours.  Invalid input(s): PT  No results for input(s): NA, K, CL, CO2, GLUCOSE, BUN, CREATININE, CALCIUM in the last 72 hours.  Physical Exam: Filed Vitals:   01/23/15 1755 01/23/15 1802  BP: 121/67 130/70    Pulse: 96 97  Temp:    Resp: 16 23   General: Alert, no acute distress Cardiovascular: No pedal edema Respiratory: No cyanosis, no use of accessory musculature GI: No organomegaly, abdomen is soft and non-tender Skin: No lesions in the area of chief complaint other than those listed below in MSK exam.  Neurologic: Sensation intact distally Psychiatric: Patient is competent for consent with normal mood and affect Lymphatic: No axillary or cervical lymphadenopathy he has a fair amount of edema to his left lower extremity.  MUSCULOSKELETAL:  The left lower extremity shortened and externally rotated. He has a fair amount of edema he does have 1+ pulses and is neurovascularly intact in all nerve distributions. Other extremities are atraumatic with painless ROM and NVI.  Assessment: Left hip dislocation no fracture  Plan: I will perform a closed reduction.  I will send a message to Dr. Lyndel Pleasure to keep him in the loop.  I would recommend admission at least overnight for pain control and for physical therapy to work on posterior hip precautions.    Procedure:  After appropriate timeout anesthetic sedation was provided by the ER staff I then performed a closed reduction of his left hip this was a satisfying reduction. His leg lengths were equal afterwards. I took a portable x-ray which showed concentric reduction and again no fracture. He tolerated this procedure well. He was neurovascularly intact post procedure.   Renette Butters, MD Cell [phone]   01/23/2015 6:10 PM

## 2015-01-23 NOTE — ED Notes (Signed)
Bed: [id number] Expected date:  Expected time:  Means of arrival:  Comments: Hip dislocation

## 2015-01-23 NOTE — H&P (Signed)
PCP:   Renato Shin, MD   Chief Complaint:  Left hip pain  HPI: 69 year old male who   has a past medical history of GERD (gastroesophageal reflux disease); Allergy; Hypertension; Palpitation; Elevated platelet count ([place]); and Heart murmur. Today came to the ED after patient had a mechanical fall in the bathroom. Patient is status post left hip hemiarthroplasty for fracture a month ago which was done at [facility]. Patient says he has a history of bone marrow transplant for myelofibrosis and has been taking prednisone for quite some time prior to femoral neck fracture. This evening patient fell in the bathroom while trying to sit down on the toilet seat which was lower than expected and in the process he landed onto the seat with left hip hitting the seat resulting in pain in the left hip along with shortening of the leg. Patient was seen by orthopedic surgeon in the ED and left hip dislocation was noted. Closed reduction was performed in the ED. And patient was admitted for pain control as well as physical therapy in morning. Patient denies any passing out, no chest pain or shortness of breath. No nausea vomiting or diarrhea. Currently his pain is well controlled, 1/10 in intensity.  Allergies:  No Known Allergies    Past Medical History  Diagnosis Date  . GERD (gastroesophageal reflux disease)   . Allergy     heyfever  . Hypertension   . Palpitation   . Elevated platelet count (HCC)     on hydroxyurea  . Heart murmur     Past Surgical History  Procedure Laterality Date  . Hernia repair    . Total hip arthroplasty      Prior to Admission medications   Medication Sig Start Date End Date Taking? Authorizing Provider  amLODipine (NORVASC) 5 MG tablet Take 5 mg by mouth. 12/29/14 12/29/15 Yes Historical Provider, MD  calcium-vitamin D (CALCIUM 500/D) 500-200 MG-UNIT tablet Take 2 tablets by mouth 2 (two) times daily. 01/04/15 05-Jan-2016 Yes Historical Provider, MD    clobetasol cream (TEMOVATE) [id number] % Apply 1 application topically 2 (two) times daily as needed (rash).   Yes Historical Provider, MD  dapsone 100 MG tablet Take 100 mg by mouth. Take ONE tablet by mouth once daily on Mon,Tue,Wed,Thur and Fridays 10/26/14 01/24/15 Yes Historical Provider, MD  desonide (DESOWEN) 0.05 % cream Apply topically. 11/02/14  Yes Historical Provider, MD  docusate sodium (COLACE) 50 MG capsule Take 100 mg by mouth.   Yes Historical Provider, MD  levofloxacin (LEVAQUIN) 500 MG tablet Take 500 mg by mouth daily. 09/01/14  Yes Historical Provider, MD  oxyCODONE (OXY IR/ROXICODONE) 5 MG immediate release tablet Take 5 mg by mouth daily as needed for moderate pain (Patient takes max once daily now).  01/11/15  Yes Historical Provider, MD  pantoprazole (PROTONIX) 40 MG tablet Take 40 mg by mouth daily.   Yes Historical Provider, MD  posaconazole (NOXAFIL) 100 MG TBEC delayed-release tablet Take 400 mg by mouth. 01/20/15  Yes Historical Provider, MD  predniSONE (DELTASONE) 10 MG tablet 15mg  daily 01/11/15  Yes Historical Provider, MD  PRESCRIPTION MEDICATION Take 1 application by mouth 3 (three) times daily. Saliva Substitute Combo (Biotene Dry Mouth Rinse). 12/10/14  Yes Historical Provider, MD  sodium chloride 0.9 % infusion Inject into the vein. 01/16/15  Yes Historical Provider, MD  solifenacin (VESICARE) 5 MG tablet Take 10 mg by mouth daily.   Yes Historical Provider, MD  tacrolimus (PROGRAF) 0.5 MG capsule 0.5-1  mg 2 (two) times daily. Take ONE capsule my mouth in the morning and take TWO capsules by mouth at night 01/04/15  Yes Historical Provider, MD  tamsulosin (FLOMAX) 0.4 MG CAPS capsule Take 0.4 mg by mouth daily.   Yes Historical Provider, MD  valACYclovir (VALTREX) 500 MG tablet Take 500 mg by mouth daily. 01/11/15  Yes Historical Provider, MD  diltiazem (CARDIZEM CD) 180 MG 24 hr capsule TAKE (1) CAPSULE DAILY BY MOUTH Patient not taking: Reported on 10/08/2014 01/26/13    Renato Shin, MD  furosemide (LASIX) 20 MG tablet  01/11/15   Historical Provider, MD  losartan-hydrochlorothiazide (HYZAAR) 100-25 MG per tablet TAKE (1/2) TABLET DAILY Patient not taking: Reported on 10/08/2014 06/05/13   Renato Shin, MD  losartan-hydrochlorothiazide [facility]) 50-12.5 MG per tablet TAKE 1/2 TABLET ONCE DAILY Patient not taking: Reported on 10/08/2014 01/12/13   Renato Shin, MD    Social History:  reports that he has never smoked. He has never used smokeless tobacco. He reports that he does not drink alcohol or use illicit drugs.  Family History  Problem Relation Age of Onset  . Ovarian cancer    . Uterine cancer    . Colitis      crohns  . Cancer Mother   . Cancer Father   . Transient ischemic attack Sister     Aortic valve replacement  . Colon cancer Neg Hx     Filed Weights   01/23/15 1728  Weight: 64.864 kg (143 lb)    All the positives are listed in BOLD  Review of Systems:  HEENT: Headache, blurred vision, runny nose, sore throat Neck: Hypothyroidism, hyperthyroidism,,lymphadenopathy Chest : Shortness of breath, history of COPD, Asthma Heart : Chest pain, history of coronary arterey disease GI:  Nausea, vomiting, diarrhea, constipation, GERD GU: Dysuria, urgency, frequency of urination, hematuria Neuro: Stroke, seizures, syncope Psych: Depression, anxiety, hallucinations   Physical Exam: Blood pressure 133/74, pulse 102, temperature 98.5 F (36.9 C), temperature source Oral, resp. rate 20, weight 64.864 kg (143 lb), SpO2 98 %. Constitutional:   Patient is a well-developed and well-nourished * in no acute distress and cooperative with exam. Head: Normocephalic and atraumatic Mouth: Mucus membranes moist Eyes: PERRL, EOMI, conjunctivae normal Neck: Supple, No Thyromegaly Cardiovascular: RRR, S1 normal, S2 normal Pulmonary/Chest: CTAB, no wheezes, rales, or rhonchi Abdominal: Soft. Non-tender, non-distended, bowel sounds are normal, no masses,  organomegaly, or guarding present.  Neurological: A&O x3, Strength is normal and symmetric bilaterally, cranial nerve II-XII are grossly intact, no focal motor deficit, sensory intact to light touch bilaterally.  Extremities : Bilateral 2+ pitting edema of the lower extremities  Labs on Admission:  Basic Metabolic Panel:  Recent Labs Lab 01/23/15 1843  NA 142  K 4.4  CL 111  CO2 24  GLUCOSE 165*  BUN 36*  CREATININE 1.96*  CALCIUM 8.2*   Liver Function Tests:  Recent Labs Lab 01/23/15 1843  AST 28  ALT 22  ALKPHOS 75  BILITOT 0.4  PROT 4.7*  ALBUMIN 2.9*   No results for input(s): LIPASE, AMYLASE in the last 168 hours. No results for input(s): AMMONIA in the last 168 hours. CBC:  Recent Labs Lab 01/23/15 1843  WBC 5.6  NEUTROABS 4.7  HGB 9.4*  HCT 29.2*  MCV 102.1*  PLT 62*    Radiological Exams on Admission: Dg Pelvis Portable  01/23/2015  CLINICAL DATA:  Status post left hip reduction. EXAM: PORTABLE PELVIS 1-2 VIEWS COMPARISON:  Same day. FINDINGS: Dislocation of left  hip hemiarthroplasty noted on prior exam has been successfully reduced. No fracture is noted. Probable large left groin hernia is again noted. IMPRESSION: Successful reduction of previously described dislocation of left hip hemiarthroplasty. No fracture is noted. Probable large left groin hernia is noted. Electronically Signed   By: Marijo Conception, M.D.   On: 01/23/2015 18:33   Dg Hip Unilat With Pelvis 2-3 Views Left  01/23/2015  CLINICAL DATA:  Left hip pain. Prior hemiarthroplasty. Clinical concern for dislocation. EXAM: DG HIP (WITH OR WITHOUT PELVIS) 2-3V LEFT COMPARISON:  None. FINDINGS: The left hip hemiarthroplasty has dislocated cephalad about 7.8 cm with respect to the acetabulum. No fracture or complication along the stem is identified. Bowel density projects over the scrotum, concerning for groin hernia unless the patient has a large pannus overhanging the lower pelvis. IMPRESSION: 1.  Left hemi arthroplasty is dislocated about 7.8 cm proximally with respect to the acetabulum. 2. Bowel density projects over the scrotum, query groin hernia. Electronically Signed   By: Van Clines M.D.   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 Would admit the patient for pain control. At this time pain is well controlled. Continue Percocet when necessary for pain. Will get physical therapy in a.m.  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 Currently patient is on prednisone 15 mg by mouth daily along with Levaquin which will be continued.  Hypertension Continue amlodipine.  Thrombocytopenia Chronic, patient has history of myelofibrosis  DVT prophylaxis SCDs, patient has thrombus cytopenia  Code status: Full code  Family discussion: Admission, patients condition and plan of care including tests being ordered have been discussed with the patient and his wife at bedside* who indicate understanding and agree with the plan and Code Status.   Time Spent on Admission: 60 min  [facility] Pager: [phone] 01/23/2015, 9:02 PM  If 7PM-7AM, please contact night-coverage  [email or web address]  Password [email or web address]

## 2015-01-23 NOTE — ED Provider Notes (Signed)
CSN: [id number]     Arrival date & time 01/23/15  1606 History   First MD Initiated Contact with Patient 01/23/15 1614     Chief Complaint  Patient presents with  . Hip Pain     (Consider location/radiation/quality/duration/timing/severity/associated sxs/prior Treatment) Patient is a 69 y.o. male presenting with hip pain.  Hip Pain This is a new problem. The current episode started less than 1 hour ago. The problem occurs constantly. The problem has not changed since onset.Pertinent negatives include no chest pain, no abdominal pain, no headaches and no shortness of breath. Exacerbated by: movement. Nothing relieves the symptoms. Treatments tried: fentnayl in route. The treatment provided no relief.    Past Medical History  Diagnosis Date  . GERD (gastroesophageal reflux disease)   . Allergy     heyfever  . Hypertension   . Palpitation   . Elevated platelet count (HCC)     on hydroxyurea  . Heart murmur    Past Surgical History  Procedure Laterality Date  . Hernia repair    . Total hip arthroplasty     Family History  Problem Relation Age of Onset  . Ovarian cancer    . Uterine cancer    . Colitis      crohns  . Cancer Mother   . Cancer Father   . Transient ischemic attack Sister     Aortic valve replacement  . Colon cancer Neg Hx    Social History  Substance Use Topics  . Smoking status: Never Smoker   . Smokeless tobacco: Never Used  . Alcohol Use: No    Review of Systems  Constitutional: Negative for fever.  HENT: Negative for sore throat.   Eyes: Negative for visual disturbance.  Respiratory: Negative for shortness of breath.   Cardiovascular: Negative for chest pain.  Gastrointestinal: Negative for abdominal pain.  Genitourinary: Negative for difficulty urinating.  Musculoskeletal: Positive for arthralgias. Negative for back pain and neck stiffness.  Skin: Negative for rash.  Neurological: Negative for syncope and headaches.      Allergies   Review of patient's allergies indicates no known allergies.  Home Medications   Prior to Admission medications   Medication Sig Start Date End Date Taking? Authorizing Provider  amLODipine (NORVASC) 5 MG tablet Take 5 mg by mouth. 12/29/14 12/29/15 Yes Historical Provider, MD  calcium-vitamin D (CALCIUM 500/D) 500-200 MG-UNIT tablet Take 2 tablets by mouth 2 (two) times daily. 01/04/15 01/17/2016 Yes Historical Provider, MD  clobetasol cream (TEMOVATE) [id number] % Apply 1 application topically 2 (two) times daily as needed (rash).   Yes Historical Provider, MD  dapsone 100 MG tablet Take 100 mg by mouth. Take ONE tablet by mouth once daily on Mon,Tue,Wed,Thur and Fridays 10/26/14 01/24/15 Yes Historical Provider, MD  desonide (DESOWEN) 0.05 % cream Apply topically. 11/02/14  Yes Historical Provider, MD  docusate sodium (COLACE) 50 MG capsule Take 100 mg by mouth.   Yes Historical Provider, MD  levofloxacin (LEVAQUIN) 500 MG tablet Take 500 mg by mouth daily. 09/01/14  Yes Historical Provider, MD  oxyCODONE (OXY IR/ROXICODONE) 5 MG immediate release tablet Take 5 mg by mouth daily as needed for moderate pain (Patient takes max once daily now).  01/11/15  Yes Historical Provider, MD  pantoprazole (PROTONIX) 40 MG tablet Take 40 mg by mouth daily.   Yes Historical Provider, MD  posaconazole (NOXAFIL) 100 MG TBEC delayed-release tablet Take 400 mg by mouth. 01/20/15  Yes Historical Provider, MD  predniSONE (DELTASONE) 10 MG tablet  15mg  daily 01/11/15  Yes Historical Provider, MD  PRESCRIPTION MEDICATION Take 1 application by mouth 3 (three) times daily. Saliva Substitute Combo (Biotene Dry Mouth Rinse). 12/10/14  Yes Historical Provider, MD  sodium chloride 0.9 % infusion Inject into the vein. 01/16/15  Yes Historical Provider, MD  solifenacin (VESICARE) 5 MG tablet Take 10 mg by mouth daily.   Yes Historical Provider, MD  tacrolimus (PROGRAF) 0.5 MG capsule 0.5-1 mg 2 (two) times daily. Take ONE capsule my  mouth in the morning and take TWO capsules by mouth at night 01/04/15  Yes Historical Provider, MD  tamsulosin (FLOMAX) 0.4 MG CAPS capsule Take 0.4 mg by mouth daily.   Yes Historical Provider, MD  valACYclovir (VALTREX) 500 MG tablet Take 500 mg by mouth daily. 01/11/15  Yes Historical Provider, MD  diltiazem (CARDIZEM CD) 180 MG 24 hr capsule TAKE (1) CAPSULE DAILY BY MOUTH Patient not taking: Reported on 10/08/2014 01/26/13   Renato Shin, MD  furosemide (LASIX) 20 MG tablet  01/11/15   Historical Provider, MD  losartan-hydrochlorothiazide (HYZAAR) 100-25 MG per tablet TAKE (1/2) TABLET DAILY Patient not taking: Reported on 10/08/2014 06/05/13   Renato Shin, MD  losartan-hydrochlorothiazide (HYZAAR) 50-12.5 MG per tablet TAKE 1/2 TABLET ONCE DAILY Patient not taking: Reported on 10/08/2014 01/12/13   Renato Shin, MD   BP 133/74 mmHg  Pulse 102  Temp(Src) 98.5 F (36.9 C) (Oral)  Resp 20  Wt 143 lb (64.864 kg)  SpO2 98% Physical Exam  Constitutional: He is oriented to person, place, and time. He appears well-developed and well-nourished. No distress.  HENT:  Head: Normocephalic and atraumatic.  Eyes: Conjunctivae and EOM are normal.  Neck: Normal range of motion.  Cardiovascular: Normal rate, regular rhythm, normal heart sounds and intact distal pulses.  Exam reveals no gallop and no friction rub.   No murmur heard. Pulmonary/Chest: Effort normal and breath sounds normal. No respiratory distress. He has no wheezes. He has no rales.  Abdominal: Soft. He exhibits no distension. There is no tenderness. There is no guarding.  Musculoskeletal: He exhibits no edema.       Left hip: He exhibits decreased range of motion, decreased strength, tenderness and deformity. He exhibits no laceration (incision, C/D/I).       Left ankle: He exhibits decreased range of motion. He exhibits normal pulse.  Neurological: He is alert and oriented to person, place, and time.  Skin: Skin is warm and dry. He  is not diaphoretic.  Nursing note and vitals reviewed.   ED Course  .Sedation Date/Time: 01/24/2015 2:11 AM Performed by: Gareth Morgan Authorized by: Gareth Morgan  Consent:    Consent obtained:  Verbal and written   Consent given by:  Patient   Risks discussed:  Allergic reaction, prolonged hypoxia resulting in organ damage, dysrhythmia and respiratory compromise necessitating ventilatory assistance and intubation   Alternatives discussed:  Analgesia without sedation Indications:    Sedation purpose:  Dislocation reduction   Procedure necessitating sedation performed by:  Physician performing sedation   Intended level of sedation:  Moderate (conscious sedation) Pre-sedation assessment:    ASA classification: class 3 - patient with severe systemic disease     Neck mobility: normal     Mouth opening:  3 or more finger widths   Thyromental distance:  3 finger widths   Mallampati score:  II - soft palate, uvula, fauces visible   Pre-sedation assessments completed and reviewed: airway patency, cardiovascular function, mental status, nausea/vomiting, pain level and respiratory function  Immediate pre-procedure details:    Reassessment: Patient reassessed immediately prior to procedure     Reviewed: vital signs and relevant labs/tests     Verified: bag valve mask available, emergency equipment available, intubation equipment available, IV patency confirmed, oxygen available and suction available   Procedure details (see MAR for exact dosages):    Preoxygenation:  Nasal cannula   Sedation:  Propofol   Intra-procedure monitoring:  Blood pressure monitoring, cardiac monitor, continuous capnometry, continuous pulse oximetry, frequent vital sign checks and frequent LOC assessments   Intra-procedure events: none     Intra-procedure management:  Airway repositioning   Sedation end time:  01/23/2015 5:53 PM   Total sedation time (minutes):  20 Post-procedure details:    Post-sedation  assessments completed and reviewed: mental status and respiratory function     Patient is stable for discharge or admission: Yes     Patient tolerance:  Tolerated well, no immediate complications  (including critical care time) Labs Review Labs Reviewed  CBC WITH DIFFERENTIAL/PLATELET - Abnormal; Notable for the following:    RBC 2.86 (*)    Hemoglobin 9.4 (*)    HCT 29.2 (*)    MCV 102.1 (*)    RDW 16.9 (*)    Platelets 62 (*)    Lymphs Abs 0.4 (*)    All other components within normal limits  COMPREHENSIVE METABOLIC PANEL - Abnormal; Notable for the following:    Glucose, Bld 165 (*)    BUN 36 (*)    Creatinine, Ser 1.96 (*)    Calcium 8.2 (*)    Total Protein 4.7 (*)    Albumin 2.9 (*)    GFR calc non Af Amer 33 (*)    GFR calc Af Amer 38 (*)    All other components within normal limits  CBC  COMPREHENSIVE METABOLIC PANEL    Imaging Review Dg Pelvis Portable  01/23/2015  CLINICAL DATA:  Status post left hip reduction. EXAM: PORTABLE PELVIS 1-2 VIEWS COMPARISON:  Same day. FINDINGS: Dislocation of left hip hemiarthroplasty noted on prior exam has been successfully reduced. No fracture is noted. Probable large left groin hernia is again noted. IMPRESSION: Successful reduction of previously described dislocation of left hip hemiarthroplasty. No fracture is noted. Probable large left groin hernia is noted. Electronically Signed   By: Marijo Conception, M.D.   On: 01/23/2015 18:33   Dg Hip Unilat With Pelvis 2-3 Views Left  01/23/2015  CLINICAL DATA:  Left hip pain. Prior hemiarthroplasty. Clinical concern for dislocation. EXAM: DG HIP (WITH OR WITHOUT PELVIS) 2-3V LEFT COMPARISON:  None. FINDINGS: The left hip hemiarthroplasty has dislocated cephalad about 7.8 cm with respect to the acetabulum. No fracture or complication along the stem is identified. Bowel density projects over the scrotum, concerning for groin hernia unless the patient has a large pannus overhanging the lower  pelvis. IMPRESSION: 1. Left hemi arthroplasty is dislocated about 7.8 cm proximally with respect to the acetabulum. 2. Bowel density projects over the scrotum, query groin hernia. Electronically Signed   By: Van Clines M.D.   On: 01/23/2015 17:02   I have personally reviewed and evaluated these images and lab results as part of my medical decision-making.   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  History and physical exam concerning for hip dislocation. Orthopedics was consulted and x-ray was ordered which confirmed this. Patient was given propofol conscious sedation, and hip was reduced by Dr. Percell Miller. Dr. Percell Miller discussed possibility of admission with the family for posterior hip precautions and pain control.  We'll admit to hospitalist for further care, PT, and pain control.     Gareth Morgan, MD 01/24/15 [phone]

## 2015-01-24 ENCOUNTER — Encounter (HOSPITAL_COMMUNITY): Payer: Self-pay | Admitting: *Deleted

## 2015-01-24 DIAGNOSIS — D7581 Myelofibrosis: Secondary | ICD-10-CM | POA: Diagnosis not present

## 2015-01-24 DIAGNOSIS — S73005S Unspecified dislocation of left hip, sequela: Secondary | ICD-10-CM | POA: Diagnosis not present

## 2015-01-24 DIAGNOSIS — D473 Essential (hemorrhagic) thrombocythemia: Secondary | ICD-10-CM | POA: Diagnosis not present

## 2015-01-24 DIAGNOSIS — S73005A Unspecified dislocation of left hip, initial encounter: Secondary | ICD-10-CM | POA: Diagnosis not present

## 2015-01-24 DIAGNOSIS — I10 Essential (primary) hypertension: Secondary | ICD-10-CM

## 2015-01-24 LAB — CBC
HCT: 27 % — ABNORMAL LOW (ref 39.0–52.0)
Hemoglobin: 8.7 g/dL — ABNORMAL LOW (ref 13.0–17.0)
MCH: 32.5 pg (ref 26.0–34.0)
MCHC: 32.2 g/dL (ref 30.0–36.0)
MCV: 100.7 fL — ABNORMAL HIGH (ref 78.0–100.0)
PLATELETS: 52 10*3/uL — AB (ref 150–400)
RBC: 2.68 MIL/uL — ABNORMAL LOW (ref 4.22–5.81)
RDW: 17 % — AB (ref 11.5–15.5)
WBC: 5.8 10*3/uL (ref 4.0–10.5)

## 2015-01-24 LAB — COMPREHENSIVE METABOLIC PANEL
ALBUMIN: 2.7 g/dL — AB (ref 3.5–5.0)
ALT: 20 U/L (ref 17–63)
ANION GAP: 4 — AB (ref 5–15)
AST: 26 U/L (ref 15–41)
Alkaline Phosphatase: 69 U/L (ref 38–126)
BUN: 36 mg/dL — ABNORMAL HIGH (ref 6–20)
CHLORIDE: 113 mmol/L — AB (ref 101–111)
CO2: 25 mmol/L (ref 22–32)
Calcium: 8.5 mg/dL — ABNORMAL LOW (ref 8.9–10.3)
Creatinine, Ser: 2.01 mg/dL — ABNORMAL HIGH (ref 0.61–1.24)
GFR calc Af Amer: 37 mL/min — ABNORMAL LOW (ref >60)
GFR calc non Af Amer: 32 mL/min — ABNORMAL LOW (ref >60)
GLUCOSE: 113 mg/dL — AB (ref 65–99)
POTASSIUM: 3.9 mmol/L (ref 3.5–5.1)
SODIUM: 142 mmol/L (ref 135–145)
TOTAL PROTEIN: 4.5 g/dL — AB (ref 6.5–8.1)
Total Bilirubin: 0.7 mg/dL (ref 0.3–1.2)

## 2015-01-24 NOTE — Discharge Instructions (Signed)
Furosemide tablets What is this medicine? FUROSEMIDE (fyoor [place] se mide) is a diuretic. It helps you make more urine and to lose salt and excess water from your body. This medicine is used to treat high blood pressure, and edema or swelling from heart, kidney, or liver disease. This medicine may be used for other purposes; ask your health care provider or pharmacist if you have questions. What should I tell my health care provider before I take this medicine? They need to know if you have any of these conditions: -abnormal blood electrolytes -diarrhea or vomiting -gout -heart disease -kidney disease, small amounts of urine, or difficulty passing urine -liver disease -thyroid disease -an unusual or allergic reaction to furosemide, sulfa drugs, other medicines, foods, dyes, or preservatives -pregnant or trying to get pregnant -breast-feeding How should I use this medicine? Take this medicine by mouth with a glass of water. Follow the directions on the prescription label. You may take this medicine with or without food. If it upsets your stomach, take it with food or milk. Do not take your medicine more often than directed. Remember that you will need to pass more urine after taking this medicine. Do not take your medicine at a time of day that will cause you problems. Do not take at bedtime. Talk to your pediatrician regarding the use of this medicine in children. While this drug may be prescribed for selected conditions, precautions do apply. Overdosage: If you think you have taken too much of this medicine contact a poison control center or emergency room at once. NOTE: This medicine is only for you. Do not share this medicine with others. What if I miss a dose? If you miss a dose, take it as soon as you can. If it is almost time for your next dose, take only that dose. Do not take double or extra doses. What may interact with this medicine? -aspirin and aspirin-like medicines -certain  antibiotics -chloral hydrate -cisplatin -cyclosporine -digoxin -diuretics -laxatives -lithium -medicines for blood pressure -medicines that relax muscles for surgery -methotrexate -NSAIDs, medicines for pain and inflammation like ibuprofen, naproxen, or indomethacin -phenytoin -steroid medicines like prednisone or cortisone -sucralfate -thyroid hormones This list may not describe all possible interactions. Give your health care provider a list of all the medicines, herbs, non-prescription drugs, or dietary supplements you use. Also tell them if you smoke, drink alcohol, or use illegal drugs. Some items may interact with your medicine. What should I watch for while using this medicine? Visit your doctor or health care professional for regular checks on your progress. Check your blood pressure regularly. Ask your doctor or health care professional what your blood pressure should be, and when you should contact him or her. If you are a diabetic, check your blood sugar as directed. You may need to be on a special diet while taking this medicine. Check with your doctor. Also, ask how many glasses of fluid you need to drink a day. You must not get dehydrated. You may get drowsy or dizzy. Do not drive, use machinery, or do anything that needs mental alertness until you know how this drug affects you. Do not stand or sit up quickly, especially if you are an older patient. This reduces the risk of dizzy or fainting spells. Alcohol can make you more drowsy and dizzy. Avoid alcoholic drinks. This medicine can make you more sensitive to the sun. Keep out of the sun. If you cannot avoid being in the sun, wear protective clothing and  use sunscreen. Do not use sun lamps or tanning beds/booths. °What side effects may I notice from receiving this medicine? °Side effects that you should report to your doctor or health care professional as soon as possible: °-blood in urine or stools °-dry mouth °-fever or  chills °-hearing loss or ringing in the ears °-irregular heartbeat °-muscle pain or weakness, cramps °-skin rash °-stomach upset, pain, or nausea °-tingling or numbness in the hands or feet °-unusually weak or tired °-vomiting or diarrhea °-yellowing of the eyes or skin °Side effects that usually do not require medical attention (report to your doctor or health care professional if they continue or are bothersome): °-headache °-loss of appetite °-unusual bleeding or bruising °This list may not describe all possible side effects. Call your doctor for medical advice about side effects. You may report side effects to FDA at 1-800-FDA-1088. °Where should I keep my medicine? °Keep out of the reach of children. °Store at room temperature between 15 and 30 degrees C (59 and 86 degrees F). Protect from light. Throw away any unused medicine after the expiration date. °NOTE: This sheet is a summary. It may not cover all possible information. If you have questions about this medicine, talk to your doctor, pharmacist, or health care provider. °  °© 2016, [employer]. (2014-04-21 13:49:50) ° °

## 2015-01-24 NOTE — Discharge Summary (Signed)
Physician Discharge Summary  Thomas Cherry [id number] DOB: March 22, 1945 DOA: 01/23/2015  PCP: Renato Shin, MD  Admit date: 01/23/2015 Discharge date: 01/24/2015  Recommendations for Outpatient Follow-up:  1. 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 Darrick Meigs HPI "70 year old male who  has a past medical history of GERD (gastroesophageal reflux disease); Allergy; Hypertension; Palpitation; Elevated platelet count ([place]); and Heart murmur. Today came to the ED after patient had a mechanical fall in the bathroom. Patient is status post left hip hemiarthroplasty for fracture a month ago which was done at [facility]. Patient says he has a history of bone marrow transplant for myelofibrosis and has been taking prednisone for quite some time prior to femoral neck fracture. This evening patient fell in the bathroom while trying to sit down on the toilet seat which was lower than expected and in the process he landed onto the seat with left hip hitting the seat resulting in pain in the left hip along with shortening of the leg."  Pt had a closed reduction of his left hip by ortho in ED.   Hospital Course:   Active Problems: Hip dislocation, left (HCC) - S/p closed reduction - Leg lengths equal afterwards. 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)  Essential hypertension - Continue Norvasc, Lasix   BPH - Continue flomax  Signed:  Leisa Lenz, MD  [facility] 01/24/2015, 9:57 AM  Pager #:  [phone]  Time spent in minutes: more than 30 minutes   Discharge Exam: Filed Vitals:   01/24/15 0410 01/24/15 0656  BP: 138/74   Pulse: 97 82  Temp: 98.8 F (37.1 C) 99.1 F (37.3 C)  Resp: 18 18   Filed Vitals:   01/23/15 2045 01/23/15 2300 01/24/15 0410 01/24/15 0656  BP: 133/74 136/72 138/74   Pulse: 102 99 97 82  Temp:  98.2 F (36.8 C) 98.8 F (37.1 C) 99.1 F (37.3 C)  TempSrc:  Oral Oral Oral  Resp: 20 20 18 18   Height:  5\' 9"  (1.753 m)    Weight:  67.4 kg (148 lb 9.4 oz)    SpO2: 98% 98% 99% 98%    General: Pt is alert, follows commands appropriately, not in acute distress Cardiovascular: Regular rate and rhythm, S1/S2 +, no murmurs Respiratory: Clear to auscultation bilaterally, no wheezing, no crackles, no rhonchi Abdominal: Soft, non tender, non distended, bowel sounds +, no guarding Extremities: no edema, no cyanosis, pulses palpable bilaterally DP and PT Neuro: Grossly nonfocal  Discharge Instructions  Discharge Instructions    Call MD for:  difficulty breathing, headache or visual disturbances    Complete by:  As directed      Call MD for:  persistant dizziness or light-headedness    Complete by:  As directed      Call MD for:  persistant nausea and vomiting    Complete by:  As directed      Call MD for:  severe uncontrolled pain    Complete by:  As directed  Diet - low sodium heart healthy    Complete by:  As directed      Discharge instructions    Complete by:  As directed   1. Creatinine prior to discharge is 2.01, slightly above baseline of 1.9. In you may resume Lasix but please follow-up with primary care physician for recheck kidney function and determine if Lasix needs to be on hold.     Increase activity slowly    Complete by:  As directed             Medication List    STOP taking these medications        diltiazem 180 MG 24 hr capsule  Commonly known as:  CARDIZEM CD     losartan-hydrochlorothiazide 100-25 MG tablet   Commonly known as:  HYZAAR     losartan-hydrochlorothiazide 50-12.5 MG tablet  Commonly known as:  HYZAAR      TAKE these medications        amLODipine 5 MG tablet  Commonly known as:  NORVASC  Take 5 mg by mouth.     CALCIUM 500/D 500-200 MG-UNIT tablet  Generic drug:  calcium-vitamin D  Take 2 tablets by mouth 2 (two) times daily.     clobetasol cream 0.05 %  Commonly known as:  TEMOVATE  Apply 1 application topically 2 (two) times daily as needed (rash).     dapsone 100 MG tablet  Take 100 mg by mouth. Take ONE tablet by mouth once daily on Mon,Tue,Wed,Thur and Fridays     desonide 0.05 % cream  Commonly known as:  DESOWEN  Apply topically.     docusate sodium 50 MG capsule  Commonly known as:  COLACE  Take 100 mg by mouth.     furosemide 20 MG tablet  Commonly known as:  LASIX     levofloxacin 500 MG tablet  Commonly known as:  LEVAQUIN  Take 500 mg by mouth daily.     oxyCODONE 5 MG immediate release tablet  Commonly known as:  Oxy IR/ROXICODONE  Take 5 mg by mouth daily as needed for moderate pain (Patient takes max once daily now).     pantoprazole 40 MG tablet  Commonly known as:  PROTONIX  Take 40 mg by mouth daily.     posaconazole 100 MG Tbec delayed-release tablet  Commonly known as:  NOXAFIL  Take 400 mg by mouth.     predniSONE 10 MG tablet  Commonly known as:  DELTASONE  15mg  daily     PRESCRIPTION MEDICATION  Take 1 application by mouth 3 (three) times daily. Saliva Substitute Combo (Biotene Dry Mouth Rinse).     sodium chloride 0.9 % infusion  Inject into the vein.     solifenacin 5 MG tablet  Commonly known as:  VESICARE  Take 10 mg by mouth daily.     tacrolimus 0.5 MG capsule  Commonly known as:  PROGRAF  0.5-1 mg 2 (two) times daily. Take ONE capsule my mouth in the morning and take TWO capsules by mouth at night     tamsulosin 0.4 MG Caps capsule  Commonly known as:  FLOMAX  Take 0.4 mg by mouth daily.     valACYclovir  500 MG tablet  Commonly known as:  VALTREX  Take 500 mg by mouth daily.           Follow-up Information    Follow up with Renato Shin, MD. Schedule an appointment as soon as possible for a visit in 1  week.   Specialty:  Endocrinology   Why:  Follow up appt after recent hospitalization   Contact information:   [address] [place] [address] [phone]        The results of significant diagnostics from this hospitalization (including imaging, microbiology, ancillary and laboratory) are listed below for reference.    Significant Diagnostic Studies: Dg Pelvis Portable  01/23/2015  CLINICAL DATA:  Status post left hip reduction. EXAM: PORTABLE PELVIS 1-2 VIEWS COMPARISON:  Same day. FINDINGS: Dislocation of left hip hemiarthroplasty noted on prior exam has been successfully reduced. No fracture is noted. Probable large left groin hernia is again noted. IMPRESSION: Successful reduction of previously described dislocation of left hip hemiarthroplasty. No fracture is noted. Probable large left groin hernia is noted. Electronically Signed   By: Marijo Conception, M.D.   On: 01/23/2015 18:33   Dg Hip Unilat With Pelvis 2-3 Views Left  01/23/2015  CLINICAL DATA:  Left hip pain. Prior hemiarthroplasty. Clinical concern for dislocation. EXAM: DG HIP (WITH OR WITHOUT PELVIS) 2-3V LEFT COMPARISON:  None. FINDINGS: The left hip hemiarthroplasty has dislocated cephalad about 7.8 cm with respect to the acetabulum. No fracture or complication along the stem is identified. Bowel density projects over the scrotum, concerning for groin hernia unless the patient has a large pannus overhanging the lower pelvis. IMPRESSION: 1. Left hemi arthroplasty is dislocated about 7.8 cm proximally with respect to the acetabulum. 2. Bowel density projects over the scrotum, query groin hernia. Electronically Signed   By: Van Clines M.D.   On: 01/23/2015 17:02    Microbiology: No results  found for this or any previous visit (from the past 240 hour(s)).   Labs: Basic Metabolic Panel:  Recent Labs Lab 01/23/15 1843 01/24/15 0418  NA 142 142  K 4.4 3.9  CL 111 113*  CO2 24 25  GLUCOSE 165* 113*  BUN 36* 36*  CREATININE 1.96* 2.01*  CALCIUM 8.2* 8.5*   Liver Function Tests:  Recent Labs Lab 01/23/15 1843 01/24/15 0418  AST 28 26  ALT 22 20  ALKPHOS 75 69  BILITOT 0.4 0.7  PROT 4.7* 4.5*  ALBUMIN 2.9* 2.7*   No results for input(s): LIPASE, AMYLASE in the last 168 hours. No results for input(s): AMMONIA in the last 168 hours. CBC:  Recent Labs Lab 01/23/15 1843 01/24/15 0418  WBC 5.6 5.8  NEUTROABS 4.7  --   HGB 9.4* 8.7*  HCT 29.2* 27.0*  MCV 102.1* 100.7*  PLT 62* 52*   Cardiac Enzymes: No results for input(s): CKTOTAL, CKMB, CKMBINDEX, TROPONINI in the last 168 hours. BNP: BNP (last 3 results) No results for input(s): BNP in the last 8760 hours.  ProBNP (last 3 results) No results for input(s): PROBNP in the last 8760 hours.  CBG: No results for input(s): GLUCAP in the last 168 hours.

## 2015-01-25 ENCOUNTER — Other Ambulatory Visit: Payer: Medicare Other

## 2015-01-31 ENCOUNTER — Other Ambulatory Visit: Payer: Self-pay | Admitting: *Deleted

## 2015-01-31 ENCOUNTER — Telehealth: Payer: Self-pay | Admitting: Internal Medicine

## 2015-01-31 DIAGNOSIS — D473 Essential (hemorrhagic) thrombocythemia: Secondary | ICD-10-CM

## 2015-01-31 NOTE — Telephone Encounter (Signed)
per pof to sch pt appt-per pof pt aware °

## 2015-02-01 ENCOUNTER — Other Ambulatory Visit: Payer: Medicare Other

## 2015-02-03 ENCOUNTER — Ambulatory Visit (HOSPITAL_BASED_OUTPATIENT_CLINIC_OR_DEPARTMENT_OTHER): Payer: Medicare Other | Admitting: Lab

## 2015-02-03 ENCOUNTER — Ambulatory Visit (HOSPITAL_BASED_OUTPATIENT_CLINIC_OR_DEPARTMENT_OTHER): Payer: Medicare Other | Admitting: Nurse Practitioner

## 2015-02-03 VITALS — BP 123/74 | HR 96 | Temp 97.8°F | Resp 18 | Ht 69.0 in | Wt 153.1 lb

## 2015-02-03 DIAGNOSIS — D473 Essential (hemorrhagic) thrombocythemia: Secondary | ICD-10-CM | POA: Diagnosis present

## 2015-02-03 DIAGNOSIS — S51011A Laceration without foreign body of right elbow, initial encounter: Secondary | ICD-10-CM

## 2015-02-03 LAB — COMPREHENSIVE METABOLIC PANEL
ALBUMIN: 2.8 g/dL — AB (ref 3.5–5.0)
ALK PHOS: 87 U/L (ref 40–150)
ALT: 13 U/L (ref 0–55)
ANION GAP: 7 meq/L (ref 3–11)
AST: 16 U/L (ref 5–34)
BILIRUBIN TOTAL: 0.43 mg/dL (ref 0.20–1.20)
BUN: 39.5 mg/dL — ABNORMAL HIGH (ref 7.0–26.0)
CALCIUM: 8.8 mg/dL (ref 8.4–10.4)
CO2: 26 mEq/L (ref 22–29)
Chloride: 110 mEq/L — ABNORMAL HIGH (ref 98–109)
Creatinine: 1.5 mg/dL — ABNORMAL HIGH (ref 0.7–1.3)
EGFR: 47 mL/min/{1.73_m2} — AB (ref >90)
Glucose: 101 mg/dl (ref 70–140)
POTASSIUM: 4 meq/L (ref 3.5–5.1)
Sodium: 144 mEq/L (ref 136–145)
TOTAL PROTEIN: 5.1 g/dL — AB (ref 6.4–8.3)

## 2015-02-03 LAB — CBC WITH DIFFERENTIAL/PLATELET
BASO%: 0.1 % (ref 0.0–2.0)
BASOS ABS: 0 10*3/uL (ref 0.0–0.1)
EOS ABS: 0.4 10*3/uL (ref 0.0–0.5)
EOS%: 6 % (ref 0.0–7.0)
HCT: 31.7 % — ABNORMAL LOW (ref 38.4–49.9)
HEMOGLOBIN: 10 g/dL — AB (ref 13.0–17.1)
LYMPH#: 1.2 10*3/uL (ref 0.9–3.3)
LYMPH%: 16.8 % (ref 14.0–49.0)
MCH: 31.4 pg (ref 27.2–33.4)
MCHC: 31.5 g/dL — ABNORMAL LOW (ref 32.0–36.0)
MCV: 99.7 fL — AB (ref 79.3–98.0)
MONO#: 0.7 10*3/uL (ref 0.1–0.9)
MONO%: 10 % (ref 0.0–14.0)
NEUT#: 4.9 10*3/uL (ref 1.5–6.5)
NEUT%: 67.1 % (ref 39.0–75.0)
NRBC: 0 % (ref 0–0)
PLATELETS: 67 10*3/uL — AB (ref 140–400)
RBC: 3.18 10*6/uL — ABNORMAL LOW (ref 4.20–5.82)
RDW: 16.5 % — AB (ref 11.0–14.6)
WBC: 7.3 10*3/uL (ref 4.0–10.3)

## 2015-02-03 NOTE — Progress Notes (Signed)
Pt in for lab only visit today. Pt requested a nurse to look at his arm where he had an IV recently in the hospital. Removed bandage patient had placed at home. Observed approx 2 inch wound with purulent drainage on bandage and wound, some swelling around wound and continuous bleeding after about 2-3 minutes with it open to air. Spoke with Clarise Cruz, LPN with Selena Lesser, NP after assessment and pt to have it observed by [facility]. Covered with new bandage and sent to lobby to wait for appt with [facility].

## 2015-02-05 ENCOUNTER — Encounter: Payer: Self-pay | Admitting: Nurse Practitioner

## 2015-02-05 DIAGNOSIS — S51019A Laceration without foreign body of unspecified elbow, initial encounter: Secondary | ICD-10-CM | POA: Insufficient documentation

## 2015-02-05 NOTE — Assessment & Plan Note (Signed)
Pt continues to take Anagrelide oral therapy as directed per [facility].  Blood counts obtained today revealed a WBC of 7.3, ANC 4.9, hemoglobin 10.0, platelet count remains low at 67-which is patient's baseline.  Patient has no follow-up appointment scheduled here the [place].  Will review all findings with Dr. Glendell Docker then follow up with patient regarding any future scheduled appointments.

## 2015-02-05 NOTE — Progress Notes (Signed)
SYMPTOM MANAGEMENT CLINIC   HPI: Thomas Cherry 69 y.o. male diagnosed with thrombocythemia.  Currently undergoing Anegrelide oral therapy.     Patient continues to recover from a left hip fracture post surgery her [facility].  Patient presented to [facility] emergency department following a fall which dislocated the same left hip on 01/23/2015.  He was admitted overnight for pain control and physical therapy.  Patient states that he left the hospital with a Band-Aid over his IV insertion site to his right antecubital region.  Patient states that he left the Band-Aid on his arm for approximately one week prior to removing it; and when he did pull the Band-Aid off.  He pulled a large amount of skin off in the right antecubital region.  Since he continues on chronic prednisone therapy.-Patient has very fragile/thin skin.  Patient has been wrapping the site with a dressing and Coban to cover this.  Exam today revealed an approximately 2 cm in diameter skin tear to the right antecubital region.  He also had an extensive amount of bruising to the surrounding region.  There is no erythema, edema, warmth, or tenderness to the site.  Patient remained afebrile.  Area was cleaned; and aspirin cause applied to the site.  Then applied a Kerlix dressing to the site for protection.  Patient was given additional dressing supplies to use over the holiday weekend as well.  Patient was advised that he should remove any future Band-Aids or dressings with adhesive within 1-2 hours to avoid any future skin tears.  Also, patient was advised to call/return of her directly to the emergency department for any worsening symptoms whatsoever.   HPI  ROS  Past Medical History  Diagnosis Date  . GERD (gastroesophageal reflux disease)   . Allergy     heyfever  . Hypertension   . Palpitation   . Elevated platelet count (HCC)     on hydroxyurea  . Heart murmur     Past Surgical History  Procedure  Laterality Date  . Hernia repair    . 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    has No Known Allergies.    Medication List       This list is accurate as of: 02/03/15 11:59 PM.  Always use your most recent med list.               amLODipine 5 MG tablet  Commonly known as:  NORVASC  Take 5 mg by mouth.     CALCIUM 500/D 500-200 MG-UNIT tablet  Generic drug:  calcium-vitamin D  Take 2 tablets by mouth 2 (two) times daily.     clobetasol cream 0.05 %  Commonly known as:  TEMOVATE  Apply 1 application topically 2 (two) times daily as needed (rash).     desonide 0.05 % cream  Commonly known as:  DESOWEN  Apply topically.     docusate sodium 50 MG capsule  Commonly known as:  COLACE  Take 100 mg by mouth.     furosemide 20 MG tablet  Commonly known as:  LASIX  levofloxacin 500 MG tablet  Commonly known as:  LEVAQUIN  Take 500 mg by mouth daily.     oxyCODONE 5 MG immediate release tablet  Commonly known as:  Oxy IR/ROXICODONE  Take 5 mg by mouth daily as needed for moderate pain (Patient takes max once daily now).     pantoprazole 40 MG tablet  Commonly known as:  PROTONIX  Take 40 mg by mouth daily.     posaconazole 100 MG Tbec delayed-release tablet  Commonly known as:  NOXAFIL  Take 400 mg by mouth.     predniSONE 10 MG tablet  Commonly known as:  DELTASONE  '15mg'$  daily     PRESCRIPTION MEDICATION  Take 1 application by mouth 3 (three) times daily.  Saliva Substitute Combo (Biotene Dry Mouth Rinse).     sodium chloride 0.9 % infusion  Inject into the vein.     solifenacin 5 MG tablet  Commonly known as:  VESICARE  Take 10 mg by mouth daily.     tacrolimus 0.5 MG capsule  Commonly known as:  PROGRAF  0.5-1 mg 2 (two) times daily. Take ONE capsule my mouth in the morning and take TWO capsules by mouth at night     tamsulosin 0.4 MG Caps capsule  Commonly known as:  FLOMAX  Take 0.4 mg by mouth daily.     valACYclovir 500 MG tablet  Commonly known as:  VALTREX  Take 500 mg by mouth daily.         PHYSICAL EXAMINATION  Oncology Vitals 02/03/2015 01/24/2015  Height 175 cm -  Weight 69.446 kg -  Weight (lbs) 153 lbs 2 oz -  BMI (kg/m2) 22.61 kg/m2 -  Temp 97.8 99.1  Pulse 96 82  Resp 18 18  Resp (Historical as of 09/13/11) - -  SpO2 97 98  BSA (m2) 1.84 m2 -   BP Readings from Last 2 Encounters:  02/03/15 123/74  01/24/15 138/74    Physical Exam  Constitutional: He is oriented to person, place, and time. Vital signs are normal. He appears malnourished. He appears unhealthy. He appears cachectic.  HENT:  Head: Normocephalic and atraumatic.  Eyes: Conjunctivae and EOM are normal. Pupils are equal, round, and reactive to light. Right eye exhibits no discharge. Left eye exhibits no discharge. No scleral icterus.  Neck: Normal range of motion.  Pulmonary/Chest: Effort normal. No respiratory distress.  Musculoskeletal: Normal range of motion. He exhibits no edema or tenderness.  Neurological: He is alert and oriented to person, place, and time. Gait normal.  Skin: Skin is warm and dry. No rash noted. No erythema. There is pallor.  Exam today revealed an approximately 2 cm in diameter skin tear to the right antecubital region.  He also had an extensive amount of bruising to the surrounding region.  There is no erythema, edema, warmth, or tenderness to the site.  Patient remained afebrile.      Psychiatric: Affect  normal.  Nursing note and vitals reviewed.   LABORATORY DATA:. Clinical Support on 02/03/2015  Component Date Value Ref Range Status  . WBC 02/03/2015 7.3  4.0 - 10.3 10e3/uL Final  . NEUT# 02/03/2015 4.9  1.5 - 6.5 10e3/uL Final  . HGB 02/03/2015 10.0* 13.0 - 17.1 g/dL Final  . HCT 02/03/2015 31.7* 38.4 - 49.9 % Final  . Platelets 02/03/2015 67* 140 - 400 10e3/uL Final  . MCV 02/03/2015 99.7* 79.3 - 98.0 fL Final  . MCH 02/03/2015 31.4  27.2 - 33.4 pg Final  .  MCHC 02/03/2015 31.5* 32.0 - 36.0 g/dL Final  . RBC 02/03/2015 3.18* 4.20 - 5.82 10e6/uL Final  . RDW 02/03/2015 16.5* 11.0 - 14.6 % Final  . lymph# 02/03/2015 1.2  0.9 - 3.3 10e3/uL Final  . MONO# 02/03/2015 0.7  0.1 - 0.9 10e3/uL Final  . Eosinophils Absolute 02/03/2015 0.4  0.0 - 0.5 10e3/uL Final  . Basophils Absolute 02/03/2015 0.0  0.0 - 0.1 10e3/uL Final  . NEUT% 02/03/2015 67.1  39.0 - 75.0 % Final  . LYMPH% 02/03/2015 16.8  14.0 - 49.0 % Final  . MONO% 02/03/2015 10.0  0.0 - 14.0 % Final  . EOS% 02/03/2015 6.0  0.0 - 7.0 % Final  . BASO% 02/03/2015 0.1  0.0 - 2.0 % Final  . nRBC 02/03/2015 0  0 - 0 % Final  . Sodium 02/03/2015 144  136 - 145 mEq/L Final  . Potassium 02/03/2015 4.0  3.5 - 5.1 mEq/L Final  . Chloride 02/03/2015 110* 98 - 109 mEq/L Final  . CO2 02/03/2015 26  22 - 29 mEq/L Final  . Glucose 02/03/2015 101  70 - 140 mg/dl Final   Glucose reference range is for nonfasting patients. Fasting glucose reference range is 70- 100.  Marland Kitchen BUN 02/03/2015 39.5* 7.0 - 26.0 mg/dL Final  . Creatinine 02/03/2015 1.5* 0.7 - 1.3 mg/dL Final  . Total Bilirubin 02/03/2015 0.43  0.20 - 1.20 mg/dL Final  . Alkaline Phosphatase 02/03/2015 87  40 - 150 U/L Final  . AST 02/03/2015 16  5 - 34 U/L Final  . ALT 02/03/2015 13  0 - 55 U/L Final  . Total Protein 02/03/2015 5.1* 6.4 - 8.3 g/dL Final  . Albumin 02/03/2015 2.8* 3.5 - 5.0 g/dL Final  . Calcium 02/03/2015 8.8  8.4 - 10.4 mg/dL Final  . Anion Gap 02/03/2015 7  3 - 11  mEq/L Final  . EGFR 02/03/2015 47* >90 ml/min/1.73 m2 Final   eGFR is calculated using the CKD-EPI Creatinine Equation (2009)     RADIOGRAPHIC STUDIES: No results found.  ASSESSMENT/PLAN:    Skin tear of elbow without complication Patient continues to recover from a left hip fracture post surgery her [facility].  Patient presented to [facility] emergency department following a fall which dislocated the same left hip on 01/23/2015.  He was admitted overnight for pain control and physical therapy.  Patient states that he left the hospital with a Band-Aid over his IV insertion site to his right antecubital region.  Patient states that he left the Band-Aid on his arm for approximately one week prior to removing it; and when he did pull the Band-Aid off.  He pulled a large amount of skin off in the right antecubital region.  Since he continues on chronic prednisone therapy.-Patient has very fragile/thin skin.  Patient has been wrapping the site with a dressing and Coban to cover this.  Exam today revealed an approximately 2 cm in diameter skin tear to the right antecubital region.  He also had an extensive amount of bruising to the surrounding region.  There is no erythema, edema, warmth, or tenderness to the site.  Patient remained afebrile.  Area was cleaned; and aspirin cause applied to the site.  Then applied a Kerlix dressing to the site for protection.  Patient was given additional dressing supplies to use over the holiday weekend as well.  Patient was advised that he should remove any future Band-Aids or dressings with adhesive within 1-2 hours to avoid any future  skin tears.  Also, patient was advised to call/return of her directly to the emergency department for any worsening symptoms whatsoever.   Essential thrombocythemia ([place]) Pt continues to take Anagrelide oral therapy as directed per [facility].  Blood counts obtained today revealed a WBC of 7.3, ANC 4.9, hemoglobin 10.0,  platelet count remains low at 67-which is patient's baseline.  Patient has no follow-up appointment scheduled here the [place].  Will review all findings with Dr. Glendell Docker then follow up with patient regarding any future scheduled appointments.   Patient stated understanding of all instructions; and was in agreement with this plan of care. The patient knows to call the clinic with any problems, questions or concerns.   Review/collaboration with Dr. Julien Nordmann  regarding all aspects of patient's visit today.   Total time spent with patient was 25  minutes;  with greater than 75  percent of that time spent in face to face counseling regarding patient's symptoms,  and coordination of care and follow up.  Disclaimer:This dictation was prepared with Dragon/digital dictation along with [employer]. Any transcriptional errors that result from this process are unintentional.  Drue Second, NP 02/05/2015

## 2015-02-05 NOTE — Assessment & Plan Note (Signed)
Patient continues to recover from a left hip fracture post surgery her [facility].  Patient presented to [facility] emergency department following a fall which dislocated the same left hip on 01/23/2015.  He was admitted overnight for pain control and physical therapy.  Patient states that he left the hospital with a Band-Aid over his IV insertion site to his right antecubital region.  Patient states that he left the Band-Aid on his arm for approximately one week prior to removing it; and when he did pull the Band-Aid off.  He pulled a large amount of skin off in the right antecubital region.  Since he continues on chronic prednisone therapy.-Patient has very fragile/thin skin.  Patient has been wrapping the site with a dressing and Coban to cover this.  Exam today revealed an approximately 2 cm in diameter skin tear to the right antecubital region.  He also had an extensive amount of bruising to the surrounding region.  There is no erythema, edema, warmth, or tenderness to the site.  Patient remained afebrile.  Area was cleaned; and aspirin cause applied to the site.  Then applied a Kerlix dressing to the site for protection.  Patient was given additional dressing supplies to use over the holiday weekend as well.  Patient was advised that he should remove any future Band-Aids or dressings with adhesive within 1-2 hours to avoid any future skin tears.  Also, patient was advised to call/return of her directly to the emergency department for any worsening symptoms whatsoever.

## 2015-02-10 ENCOUNTER — Encounter: Payer: Self-pay | Admitting: *Deleted

## 2015-03-18 ENCOUNTER — Inpatient Hospital Stay (HOSPITAL_COMMUNITY)
Admission: RE | Admit: 2015-03-18 | Discharge: 2015-03-26 | DRG: 948 | Disposition: A | Discharge status: Home Health | Payer: Medicare Other | Source: Intra-hospital | Attending: Physical Medicine & Rehabilitation | Admitting: Physical Medicine & Rehabilitation

## 2015-03-18 ENCOUNTER — Encounter: Payer: Self-pay | Admitting: *Deleted

## 2015-03-18 ENCOUNTER — Other Ambulatory Visit (HOSPITAL_COMMUNITY): Payer: Self-pay | Admitting: Physician Assistant

## 2015-03-18 DIAGNOSIS — G622 Polyneuropathy due to other toxic agents: Secondary | ICD-10-CM

## 2015-03-18 DIAGNOSIS — D89813 Graft-versus-host disease, unspecified: Secondary | ICD-10-CM | POA: Diagnosis present

## 2015-03-18 DIAGNOSIS — Z96642 Presence of left artificial hip joint: Secondary | ICD-10-CM | POA: Diagnosis present

## 2015-03-18 DIAGNOSIS — Z79899 Other long term (current) drug therapy: Secondary | ICD-10-CM | POA: Diagnosis not present

## 2015-03-18 DIAGNOSIS — Z8781 Personal history of (healed) traumatic fracture: Secondary | ICD-10-CM

## 2015-03-18 DIAGNOSIS — IMO0002 Reserved for concepts with insufficient information to code with codable children: Secondary | ICD-10-CM | POA: Diagnosis present

## 2015-03-18 DIAGNOSIS — G959 Disease of spinal cord, unspecified: Secondary | ICD-10-CM | POA: Diagnosis present

## 2015-03-18 DIAGNOSIS — R5381 Other malaise: Secondary | ICD-10-CM

## 2015-03-18 DIAGNOSIS — T451X5A Adverse effect of antineoplastic and immunosuppressive drugs, initial encounter: Secondary | ICD-10-CM | POA: Diagnosis present

## 2015-03-18 DIAGNOSIS — K219 Gastro-esophageal reflux disease without esophagitis: Secondary | ICD-10-CM | POA: Diagnosis present

## 2015-03-18 DIAGNOSIS — I10 Essential (primary) hypertension: Secondary | ICD-10-CM | POA: Diagnosis present

## 2015-03-18 DIAGNOSIS — R531 Weakness: Secondary | ICD-10-CM | POA: Diagnosis present

## 2015-03-18 DIAGNOSIS — D7581 Myelofibrosis: Secondary | ICD-10-CM | POA: Diagnosis present

## 2015-03-18 DIAGNOSIS — N400 Enlarged prostate without lower urinary tract symptoms: Secondary | ICD-10-CM | POA: Diagnosis present

## 2015-03-18 DIAGNOSIS — N390 Urinary tract infection, site not specified: Secondary | ICD-10-CM | POA: Insufficient documentation

## 2015-03-18 DIAGNOSIS — S73005A Unspecified dislocation of left hip, initial encounter: Secondary | ICD-10-CM | POA: Diagnosis present

## 2015-03-18 DIAGNOSIS — K5900 Constipation, unspecified: Secondary | ICD-10-CM | POA: Diagnosis present

## 2015-03-18 DIAGNOSIS — Z9481 Bone marrow transplant status: Secondary | ICD-10-CM

## 2015-03-18 DIAGNOSIS — D649 Anemia, unspecified: Secondary | ICD-10-CM | POA: Diagnosis present

## 2015-03-18 DIAGNOSIS — I951 Orthostatic hypotension: Secondary | ICD-10-CM | POA: Insufficient documentation

## 2015-03-18 DIAGNOSIS — S73005S Unspecified dislocation of left hip, sequela: Secondary | ICD-10-CM

## 2015-03-18 DIAGNOSIS — G620 Drug-induced polyneuropathy: Secondary | ICD-10-CM | POA: Diagnosis present

## 2015-03-18 MED ORDER — BISACODYL 10 MG RE SUPP: 10.0000 mg | Freq: Every day | RECTAL | Status: DC | PRN

## 2015-03-18 MED ORDER — AMLODIPINE BESYLATE 10 MG PO TABS
10.0000 mg | ORAL_TABLET | Freq: Every day | ORAL | Status: DC
  Administered 2015-03-19 – 2015-03-22 (×4): 10 mg via ORAL
  Filled 2015-03-18 (×4): qty 1

## 2015-03-18 MED ORDER — SORBITOL 70 % SOLN
30.0000 mL | Status: AC
  Administered 2015-03-18: 30 mL via ORAL
  Filled 2015-03-18: qty 30

## 2015-03-18 MED ORDER — PREDNISONE 10 MG PO TABS
10.0000 mg | ORAL_TABLET | Freq: Every day | ORAL | Status: DC
  Administered 2015-03-19 – 2015-03-26 (×8): 10 mg via ORAL
  Filled 2015-03-18 (×8): qty 1

## 2015-03-18 MED ORDER — SORBITOL 70 % SOLN: 30.0000 mL | Freq: Every day | Status: DC | PRN

## 2015-03-18 MED ORDER — ONDANSETRON HCL 4 MG/2ML IJ SOLN: 4.0000 mg | Freq: Four times a day (QID) | INTRAMUSCULAR | Status: DC | PRN

## 2015-03-18 MED ORDER — FAMOTIDINE 20 MG PO TABS
20.0000 mg | ORAL_TABLET | Freq: Every day | ORAL | Status: DC
  Administered 2015-03-19 – 2015-03-26 (×8): 20 mg via ORAL
  Filled 2015-03-18 (×9): qty 1

## 2015-03-18 MED ORDER — POLYETHYLENE GLYCOL 3350 17 G PO PACK
17.0000 g | PACK | Freq: Every day | ORAL | Status: DC
  Administered 2015-03-18 – 2015-03-26 (×8): 17 g via ORAL
  Filled 2015-03-18 (×9): qty 1

## 2015-03-18 MED ORDER — ACETAMINOPHEN 325 MG PO TABS
325.0000 mg | ORAL_TABLET | ORAL | Status: DC | PRN
  Administered 2015-03-21: 650 mg via ORAL
  Filled 2015-03-18 (×4): qty 2

## 2015-03-18 MED ORDER — ALBUTEROL SULFATE (2.5 MG/3ML) 0.083% IN NEBU: 2.5000 mg | INHALATION_SOLUTION | Freq: Four times a day (QID) | RESPIRATORY_TRACT | Status: DC | PRN

## 2015-03-18 MED ORDER — VALACYCLOVIR HCL 500 MG PO TABS
500.0000 mg | ORAL_TABLET | Freq: Every day | ORAL | Status: DC
  Administered 2015-03-19 – 2015-03-26 (×8): 500 mg via ORAL
  Filled 2015-03-18 (×10): qty 1

## 2015-03-18 MED ORDER — SENNOSIDES-DOCUSATE SODIUM 8.6-50 MG PO TABS
2.0000 | ORAL_TABLET | Freq: Every day | ORAL | Status: DC
  Administered 2015-03-19 – 2015-03-25 (×7): 2 via ORAL
  Filled 2015-03-18 (×7): qty 2

## 2015-03-18 MED ORDER — DARIFENACIN HYDROBROMIDE ER 7.5 MG PO TB24
7.5000 mg | ORAL_TABLET | Freq: Every day | ORAL | Status: DC
  Administered 2015-03-18 – 2015-03-19 (×2): 7.5 mg via ORAL
  Filled 2015-03-18 (×4): qty 1

## 2015-03-18 MED ORDER — ONDANSETRON HCL 4 MG PO TABS: 4.0000 mg | ORAL_TABLET | Freq: Four times a day (QID) | ORAL | Status: DC | PRN

## 2015-03-18 MED ORDER — FLUCONAZOLE 200 MG PO TABS
400.0000 mg | ORAL_TABLET | Freq: Every day | ORAL | Status: DC
Start: 2015-03-18 — End: 2015-03-26
  Administered 2015-03-19 – 2015-03-26 (×8): 400 mg via ORAL
  Filled 2015-03-18 (×10): qty 2

## 2015-03-18 MED ORDER — DESONIDE 0.05 % EX OINT
TOPICAL_OINTMENT | Freq: Two times a day (BID) | CUTANEOUS | Status: DC
  Filled 2015-03-18: qty 15

## 2015-03-18 MED ORDER — TACROLIMUS 1 MG PO CAPS
1.0000 mg | ORAL_CAPSULE | Freq: Two times a day (BID) | ORAL | Status: AC
  Administered 2015-03-18 – 2015-03-25 (×15): 1 mg via ORAL
  Filled 2015-03-18 (×15): qty 1

## 2015-03-18 MED ORDER — CHOLECALCIFEROL 10 MCG (400 UNIT) PO TABS
400.0000 [IU] | ORAL_TABLET | Freq: Three times a day (TID) | ORAL | Status: DC
  Administered 2015-03-18: 400 [IU] via ORAL
  Filled 2015-03-18 (×6): qty 1

## 2015-03-18 MED ORDER — CLOBETASOL PROPIONATE 0.05 % EX CREA
TOPICAL_CREAM | Freq: Two times a day (BID) | CUTANEOUS | Status: DC
  Administered 2015-03-18: 1 via TOPICAL
  Administered 2015-03-21 – 2015-03-25 (×3): via TOPICAL
  Filled 2015-03-18: qty 15

## 2015-03-18 NOTE — Progress Notes (Signed)
[facility] PMR Admission Coordinator Pre-Admission Assessment  Patient: Thomas Cherry is an 70 y.o., male MRN: [id number] DOB: Mar 23, 1945 Height:   Weight:     Insurance Information HMO: PPO: PCP: IPA: 80/20: yes OTHER: no HMO PRIMARY: Medicare a and b Policy#: [id number] Subscriber: pt Benefits: Phone #: passport one Name: 03/18/15 Eff. Date: 06/12/2009 Deduct: $1316 Out of Pocket Max: none Life Max: none CIR: 100% SNF: 20 full days Outpatient: 80% Co-Pay: 20% Home Health: 100% Co-Pay: none DME: 80% Co-Pay: 20% Providers: pt choice  SECONDARY: [employer] [place] Policy#: [id number] Subscriber: pt No auth with medicare primary  Medicaid Application Date: Case Manager:  Disability Application Date: Case Worker:   Emergency Contact Information Contact Information    Name Relation Home Work Mobile   Kylenn, Dozier Spouse [phone]  [phone]      Current Medical History  Patient Admitting Diagnosis: debility  History of Present Illness: 70 year old man with 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 Patient admitted 03/14/2015 with complaints of acute difficulty standing and falling on Saturday 03/12/15. Admitted with acute worsening of debilitation and weakness.  Neurology consulted 03/14/15 with workup revealed MRI brain and cervical spine- brain MRI normal, C-spine with degenerative disease C4-5, no indication that could be causing weakness. CK normal. Zoloft held as this was the most recent added medication prior to weakness, antibiotic completed for pneumonia. EMG shows severe polyneuropathy with dapsone and tacrolimus being likely culprits. Stopped dapsone 03/16/15 and gave inhaled pentamidine. Starting to taper tacrolimus.  Remains anemic, thrombocytopenic. Has not been transfusion dependent/ Possibility related to GvHD. Iron  panel 03/04/15 shows low iron, transferritin and TIBC with elevated ferritin consistent with anemia of chronic disease.  Valtrex daily. Dapsone stopped 03/16/15. Gave inhaled pentamidine 03/16/15. Next due 04/12/15. CD4 low of 156 so to continue on PJP prophy despite being only on low dose steroids. ON 2/3 plans for ID to continue nystatin and fluconazole for 10 day course.  RUL Pneumonia received levaquin 12/31 until 03/02/15. Recent previous admit 03/04/15 for fever and RUL PNA. Treated with IV ertapenem and d/c'd home at that time on IV artapenem and oral Azithromysin and completed 10 day course 03/13/15.   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 Plan to taper another 1-2 weeks if no further issue with GVHD. Continued on prednisone 10 mg daily. On 03/18/15 d/c to continue tacrolimus at 1 mg BID with planned stop date of 03/25/15 given potential to contribute to neuropathy. To continue on prednisone 10 mg daily with topical steroids availalble prn rash.  History of acute skin GVHD diagnosed 10/09/14 with prolonged steroid taper since. Current dose 10 mg daily. Have had difficulty tapering due to multiple flares of skin rash and orthostatic hypotension. Plans to continue prednisone and clobetasol prn.  For his symptomatic orthostatic hypotension on Florinef 0.1 mg for two weeks when admitted 03/14/15. Held on admission given borderline high BP. On 03/15/15 added hydralazine prn for SBP > 180 due to HTN. 03/16/15 added norvasc 5 mg daily with hydralzine prn. Cosyntropin stim test normal. 03/18/15 Increased norvasc to 10 mg daily.   Previously reported left side inguinal hernia pt has had since 2013. He has to reduce it at least every other day. Pt has been referred to GI surgery pta.  Recent hip fracture likely osteoporosis secondary to chronic steroid use. S/p left hemi 12/23/14. Now in leg brace secondary to dsilocation and will remain in brace for  four weeks.      Patient's medical record from [facility] hospital has been reviewed by the rehabilitation admission coordinator and physician. NIH Stroke scale: Glascow Coma Scale:  Past Medical History  Past Medical History  Diagnosis Date  . GERD (gastroesophageal reflux disease)   . Allergy     heyfever  . Hypertension   . Palpitation   . Elevated platelet count (HCC)     on hydroxyurea  . Heart murmur     Family History  family history includes Cancer in his father and mother; Transient ischemic attack in his sister. There is no history of Colon cancer.  Prior Rehab/Hospitalizations Has the patient had major surgery during 100 days prior to admission? Yes HIp fx 11/16 with partial hip replacement 12/23/14 at [facility]  Current Medications Amlodipine Desonide Famotidine Heparin Pentamidine Prednisone Tacrolimus valacyclovit  Patients Current Diet: Regular with thin liquids  Precautions / Restrictions Precautions Precautions: Other (comment), Fall (neutropenic precautions) Restrictions Weight Bearing Restrictions: No Other Position/Activity Restrictions: LLE brace at all times to prevent hip dislocation   Has the patient had 2 or more falls or a fall with injury in the past year?Yes Hip fx related to steroid use not fall Has fallen twice in past two months  Prior Activity Level Limited Community (1-2x/wk): limited since hip fx 12/2014. 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 His wife has recently had to assist with supervision for transfers and some assistnce with bathing and dressing due to hip dislocation and LLE brace. Pt does particiapate in family business with wife and daughter, Dian Situ for court stenographer for depositions  Prior Functional Level Self Care: Did the patient need help bathing, dressing, using the toilet or eating? Needed some help  Indoor  Mobility: Did the patient need assistance with walking from room to room (with or without device)? Needed some help  Stairs: Did the patient need assistance with internal or external stairs (with or without device)? Needed some help  Functional Cognition: Did the patient need help planning regular tasks such as shopping or remembering to take medications? Independent  Home Assistive Devices / Equipment Home Assistive Devices/Equipment: Bedside commode/3-in-1, Environmental consultant (specify type), Cane (specify quad or straight)  Prior Device Use: Indicate devices/aids used by the patient prior to current illness, exacerbation or injury? Walker   Prior Functional Level Current Functional Level  Bed Mobility  Mod Independent  Min assist. Tremors in B hands. Needs extra time for supine to sit EOB.   Transfers  Mod Independent. Prior to hip fx 11/16  Min assist (sits unsupported EOB with min due to posterior lean)   Mobility - Walk/Wheelchair  Mod Independent  Min assist (ambulated 15 ft using RW with min assist initially and progr)resses to mod assist as he fatigues. Forward flexed posture, slow cadence, decreased step length and narrow BOS. Seated rest break between ambulation trials due to pt's report of fatigue and LE weakness.  Upper Body Dressing  Min assistdoes own adls but progressively needed more assist for transfers and close guarding with ambulation.   Min assist   Lower Body Dressing  Min assist due to LLE brace  Mod assist   Grooming  Independent  Min assist   Eating/Drinking  Independent  Mod Independent   Toilet Transfer  Mod Independent  Min assist   Bladder Continence   continent  continent/uses urinal   Bowel Management  continent  No BM since admit 03/14/15  Stair Climbing      (not attempted)   Communication  independent  independent   Memory  intact  intact   Cooking/Meal Prep  wife      Housework  wife    Money Management       Driving         Special needs/care consideration Skin wife reports some abrasions to arms due to recent falls. Very dry skin and history of rashes Bowel mgmt: no BM since admit 03/14/15. Bladder mgmt: continent/urinal  Previous Home Environment Living Arrangements: Spouse/significant other Lives With: Spouse Available Help at Discharge: Family, Available 24 hours/day Type of Home: House Home Layout: One level Home Access: Ramped entrance Bathroom Shower/Tub: Multimedia programmer: Handicapped height Bathroom Accessibility: Yes How Accessible: Accessible via walker Waggoner: No Additional Comments: decline in function since 11/16 Hip fx 11/16 due to steroids not fall or injury per wife  Discharge Living Setting Plans for Discharge Living Setting: Patient's home, Lives with (comment) (wife) Type of Home at Discharge: House Discharge Home Layout: One level Discharge Home Access: [place] entrance Discharge Bathroom Shower/Tub: Walk-in shower Discharge Bathroom Toilet: Handicapped height Discharge Bathroom Accessibility: Yes How Accessible: Accessible via walker Does the patient have any problems obtaining your medications?: No  Social/Family/Support Systems Patient Roles: Spouse, Parent, Other (Comment) (self employed home business for court depostions) Contact Information: Merlene Laughter, wife Anticipated Caregiver: wife, and daughter, Dian Situ and wife's sister Anticipated Ambulance person Information: see above Ability/Limitations of Caregiver: wife and dtr, Dian Situ work in family business but can coordinate care to provide 24/7 supervision Caregiver Availability: 24/7 Discharge Plan Discussed with Primary Caregiver: Yes Is Caregiver In Agreement with Plan?: Yes Does Caregiver/Family have Issues with Lodging/Transportation while Pt is in Rehab?: No  Goals/Additional Needs Patient/Family Goal for  Rehab: Mod I to supervision with PT, supervision to min assist with OT Expected length of stay: ELOS 10-12 days Pt/Family Agrees to Admission and willing to participate: Yes Program Orientation Provided & Reviewed with Pt/Caregiver Including Roles & Responsibilities: Yes  Patient Condition: I have reviewed all medical records provided from [facility] in [place], [place] and spoke with social worker and wife by phone. Patient will benefit from ongoing PT and OT, Rehabilitative Nursing and Rehabilitative Medicaine. Also benefit from the coordinated team approach as he receives 3 hours of therapy a day with MD and Nursing interventions. He is currently min to mod assist with all adls and mobility. We will admit pt to acute inpatient rehab today.  Preadmission Screen Completed By: Cleatrice Burke, 03/18/2015 1:02 PM ______________________________________________________________________  Discussed status with Dr. Naaman Plummer on 03/18/2015 at 1302 and received telephone approval for admission today.  Admission Coordinator: Cleatrice Burke, time 1302 Date 03/18/2015.   Assessment/Plan: Diagnosis: debility related to multiple medical  1. Does the need for close, 24 hr/day Medical supervision in concert with the patient's rehab needs make it unreasonable for this patient to be served in a less intensive setting? Yes 2. Co-Morbidities requiring supervision/potential complications: MF, peripheral neuropathy, left hip fx/hemi 3. Due to bladder management, bowel management, safety, skin/wound care, disease management, medication administration, pain management and patient education, does the patient require 24 hr/day rehab nursing? Yes 4. Does the patient require coordinated care of a physician, rehab nurse, PT (1-2 hrs/day, 5 days/week) and OT (1-2 hrs/day, 5 days/week) to address physical and functional deficits in the context of the above medical diagnosis(es)? Yes Addressing deficits in the  following areas: balance, endurance, locomotion, strength,  transferring, bowel/bladder control, bathing, dressing, feeding, grooming, toileting and psychosocial support 5. Can the patient actively participate in an intensive therapy program of at least 3 hrs of therapy 5 days a week? Yes 6. The potential for patient to make measurable gains while on inpatient rehab is excellent 7. Anticipated functional outcomes upon discharge from inpatients are: modified independent PT, modified independent OT, n/a SLP 8. Estimated rehab length of stay to reach the above functional goals is: 10 days 9. Does the patient have adequate social supports to accommodate these discharge functional goals? Yes 10. Anticipated D/C setting: Home 11. Anticipated post D/C treatments: [place] therapy 12. Overall Rehab/Functional Prognosis: excellent    RECOMMENDATIONS: This patient's condition is appropriate for continued rehabilitative care in the following setting: CIR Patient has agreed to participate in recommended program. Yes Note that insurance prior authorization may be required for reimbursement for recommended care.  Comment: Admit to inpatient rehab today   Meredith Staggers, MD, [facility] 03/18/2015  Cleatrice Burke 03/18/2015      Cosigned by: Meredith Staggers, MD at 03/18/2015 1:18 PM  Revision History     Date/Time User Provider Type Action   03/18/2015 1:18 PM Meredith Staggers, MD Physician Cosign   03/18/2015 1:18 PM Meredith Staggers, MD Physician Sign   03/18/2015 1:02 PM Cristina Gong, RN Rehab Admission Coordinator Share   View Details Report

## 2015-03-18 NOTE — Interval H&P Note (Signed)
Thomas Cherry was admitted today to Inpatient Rehabilitation with the diagnosis of debility.  The patient's history has been reviewed, patient examined, and there is no change in status.  Patient continues to be appropriate for intensive inpatient rehabilitation.  I have reviewed the patient's chart and labs.  Questions were answered to the patient's satisfaction. The PAPE has been reviewed and assessment remains appropriate.  Thomas Cherry T 03/18/2015, 3:27 PM

## 2015-03-18 NOTE — H&P (Signed)
Physical Medicine and Rehabilitation Admission H&P    Chief complaint: Weakness  HPI: 70 year old right-handed male with history of hypertension, recent hip fracture November 2016 secondary to osteoporosis complicated by dislocation with brace applied and posterior hip precautions, severe polyneuropathy felt to be secondary dapsone and tacrolimus that were adjusted and recent EMG completed, myelofibrosis status post bone marrow transplant 2015 followed by Dr. Virgina Norfolk [phone] oncology services at [facility]. Patient lives with his wife in [place]. Independent with rolling walker since hip fracture in November 2016. Wife and daughter can assist. Presented 03/14/2015 to [facility] with acute weakness and inability to walk or stand. He recently completed course of antibiotics for pneumonia. Noted recent fall sustaining abrasion to right forearm. Admission labs unremarkable except mild hypokalemia 3.4. Acute on chronic anemia 8.5. MRI brain cervical spine unremarkable. C-spine films. Degenerative disease C4-C5. Patient with bouts of orthostasis he did require Florinef and Norvasc recently discontinued and later resumed with Florinef discontinued. Patient requiring min mod assist for functional mobility. Admitted for comprehensive rehabilitation program  Review of Systems  Constitutional: Positive for malaise/fatigue. Negative for fever and chills.  HENT: Negative for hearing loss.   Eyes: Negative for blurred vision and double vision.  Respiratory: Negative for cough.        Shortness of breath with exertion  Cardiovascular: Positive for palpitations and leg swelling. Negative for chest pain.  Gastrointestinal: Positive for constipation. Negative for nausea and vomiting.       GERD  Genitourinary: Positive for urgency. Negative for hematuria.  Musculoskeletal: Positive for myalgias, joint pain and falls.  Skin: Negative for rash.  Neurological: Positive for  weakness. Negative for seizures, loss of consciousness and headaches.  Psychiatric/Behavioral: Positive for depression.  All other systems reviewed and are negative.  Past Medical History  Diagnosis Date  . GERD (gastroesophageal reflux disease)   . Allergy     heyfever  . Hypertension   . Palpitation   . Elevated platelet count (HCC)     on hydroxyurea  . Heart murmur    Past Surgical History  Procedure Laterality Date  . Hernia repair    . Total hip arthroplasty     Family History  Problem Relation Age of Onset  . Ovarian cancer    . Uterine cancer    . Colitis      crohns  . Cancer Mother   . Cancer Father   . Transient ischemic attack Sister     Aortic valve replacement  . Colon cancer Neg Hx    Social History:  reports that he has never smoked. He has never used smokeless tobacco. He reports that he does not drink alcohol or use illicit drugs. Allergies: No Known Allergies  Medications prior to admission. Norvasc 5 mg daily, Pepcid 20 mg daily, prednisone 10 mg daily,Tacrolimus 1 mg twice a day, dicyclomine 500 mg daily.  Home: Patient lives with his wife. One level home with ramp. Wife local daughter can assist.     Functional History:   independent with rolling walker prior to admission as well as hip brace after recent hip dislocation with posterior hip precautions.  Functional Status:  Mobility: Min/ moderate assist for functional mobility          ADL: Min mod assist activities daily living needing assistance for lower body dressing    Cognition:wnl      Physical Exam: 102/60 P74 RR18 T98 Physical Exam  Constitutional: He is oriented to person,  place, and time. No distress.  Frail appearing  HENT:  Head: Normocephalic and atraumatic.  Eyes: Conjunctivae and EOM are normal. Pupils are equal, round, and reactive to light.  Neck: Normal range of motion. Neck supple. No JVD present. No tracheal deviation present. No thyromegaly present.    Cardiovascular: Normal rate and regular rhythm.   No murmur heard. Systolic click  Respiratory: Effort normal and breath sounds normal. No respiratory distress. He has no wheezes. He has no rales.  GI: Soft. Bowel sounds are normal. He exhibits no distension. There is no tenderness. There is no rebound.  Musculoskeletal: He exhibits no edema or tenderness.  Left hip brace in place---appears to be fitting appropriately.   Neurological: He is alert and oriented to person, place, and time. No cranial nerve deficit.  UE strength 4+/5 prox to distal. LLE: HF 3+, KE 4, ADF/PF 4/5. RLE: HF 4, KE 4+, ADF/PF 5/5. Decreased LT/PP below the mid shin on both legs. DTR's 1+. Cognitively appropriate.   Skin: Skin is warm and dry. He is not diaphoretic.  Dry flaking skin throughout both upper and lower extremities. Skin much more involved than hands. No frank breakdown.   Psychiatric: He has a normal mood and affect. His behavior is normal. Thought content normal.    No results found for this or any previous visit (from the past 48 hour(s)). No results found.     Medical Problem List and Plan: 1.  Weakness/myelopathy, debilitation secondary to myelofibrosis. Have contacted Dr. Julien Nordmann oncology services regarding patient's admission to inpatient rehab.  -no immediate issues that require his attention 2.  DVT Prophylaxis/Anticoagulation: SCDs. Monitor for any signs of DVT. OOB/ambulation 3. Pain Management: Tylenol as needed 4. Hypertension. Norvasc 10 mg daily. Monitor with increased mobility 5. Neuropsych: This patient is capable of making decisions on his own behalf. 6. Skin/Wound Care: Routine skin checks  -continue topical rx 7. Fluids/Electrolytes/Nutrition: Routine I&O with follow-up chemistries. Encourage PO 8. History of hip fracture with recurrent dislocation. Hip brace at all times with hip precautions.(has another month in brace) Weightbearing as tolerated. 9. GERD. Pepcid 10. BPH.  Thoracic care 10 mg daily. 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 Constipation: sorbitol on admit. Suppository if needed  -daily senokot-s  Post Admission Physician Evaluation: 1. Functional deficits secondary  to Weakness, gait deficits, debility due to myelofibrosis and multiple medical issues, peripheral neuropathy. 2. Patient is admitted to receive collaborative, interdisciplinary care between the physiatrist, rehab nursing staff, and therapy team. 3. Patient's level of medical complexity and substantial therapy needs in context of that medical necessity cannot be provided at a lesser intensity of care such as a SNF. 4. Patient has experienced substantial functional loss from his/her baseline which was documented above under the "Functional History" and "Functional Status" headings.  Judging by the patient's diagnosis, physical exam, and functional history, the patient has potential for functional progress which will result in measurable gains while on inpatient rehab.  These gains will be of substantial and practical use upon discharge  in facilitating mobility and self-care at the household level. 5. Physiatrist will provide 24 hour management of medical needs as well as oversight of the therapy plan/treatment and provide guidance as appropriate regarding the interaction of the two. 6. 24 hour rehab nursing will assist with bladder management, bowel management, safety, skin/wound care, disease management, medication administration, pain management and patient education  and help integrate therapy concepts, techniques,education, etc. 7. PT will assess and  treat for/with: Lower extremity strength, range of motion, stamina, balance, functional mobility, safety, adaptive techniques and equipment, NMR, visual-perceptual awareness, vestibular assessment, orthotics, ego support.   Goals are: mod I. 8. OT will assess and treat for/with: ADL's, functional mobility,  safety, upper extremity strength, adaptive techniques and equipment, NMR, visual-perceptual awareness, ego support, family education.   Goals are: mod I. Therapy may proceed with showering this patient if brace can be kept dry. 9. SLP will assess and treat for/with: n/a.  Goals are: n/a. 10. Case Management and Social Worker will assess and treat for psychological issues and discharge planning. 11. Team conference will be held weekly to assess progress toward goals and to determine barriers to discharge. 12. Patient will receive at least 3 hours of therapy per day at least 5 days per week. 13. ELOS: 8-10 days       14. Prognosis:  excellent     Meredith Staggers, MD, [facility] 03/18/2015   03/18/2015

## 2015-03-18 NOTE — PMR Pre-admission (Signed)
[facility] PMR Admission Coordinator Pre-Admission Assessment  Patient: Thomas Cherry is an 70 y.o., male MRN: [id number] DOB: 08-May-1945 Height:   Weight:     Insurance Information HMO:     PPO:      PCP:      IPA:      80/20: yes     OTHER: no HMO PRIMARY: Medicare a and b      Policy#: [id number]      Subscriber: pt Benefits:  Phone #: passport one     Name: 03/18/15 Eff. Date: 06/12/2009     Deduct: $1316      Out of Pocket Max: none      Life Max: none CIR: 100%      SNF: 20 full days Outpatient: 80%     Co-Pay: 20% Home Health: 100%      Co-Pay: none DME: 80%    Co-Pay: 20% Providers: pt choice  SECONDARY: [employer] [place]      Policy#: [id number]      Subscriber: pt No auth with medicare primary  Medicaid Application Date:       Case Manager:  Disability Application Date:       Case Worker:   Emergency Contact Information Contact Information    Name Relation Home Work Mobile   [place], [place] Spouse [phone]  [phone]      Current Medical History  Patient Admitting Diagnosis: debility  History of Present Illness: 70 year old man with 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  Patient admitted 03/14/2015 with complaints of acute difficulty standing and falling on Saturday 03/12/15. Admitted with acute worsening of debilitation and weakness.  Neurology consulted 03/14/15 with workup revealed MRI brain and cervical spine- brain MRI normal, C-spine with degenerative disease C4-5, no indication that could be causing weakness. CK normal. Zoloft held as this was the most recent added medication prior to weakness, antibiotic completed for pneumonia. EMG shows severe polyneuropathy with dapsone and tacrolimus being likely culprits. Stopped dapsone 03/16/15 and gave inhaled pentamidine. Starting to taper tacrolimus.  Remains anemic, thrombocytopenic. Has not been transfusion dependent/ Possibility related to GvHD. Iron panel 03/04/15  shows low iron, transferritin and TIBC with elevated ferritin consistent with anemia of chronic disease.  Valtrex daily. Dapsone stopped 03/16/15. Gave inhaled pentamidine 03/16/15. Next due 04/12/15. CD4 low of 156 so to continue on PJP prophy despite being only on low dose steroids. ON 2/3 plans for ID to continue nystatin and fluconazole for 10 day course.  RUL Pneumonia received levaquin 12/31 until 03/02/15. Recent previous admit 03/04/15 for fever and RUL PNA. Treated with IV ertapenem and d/c'd home at that time on IV artapenem and oral Azithromysin and completed 10 day course 03/13/15.   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 Plan to taper another 1-2 weeks if no further issue with GVHD. Continued on prednisone 10 mg daily. On 03/18/15 d/c to continue tacrolimus at 1 mg BID with planned stop date of 03/25/15 given potential to contribute to neuropathy. To continue on prednisone 10 mg daily with topical steroids availalble prn rash.  History of acute skin GVHD diagnosed 10/09/14 with prolonged steroid taper since. Current dose 10 mg daily. Have had difficulty tapering due to multiple flares of skin rash and orthostatic hypotension.  Plans to continue prednisone and clobetasol prn.  For his symptomatic orthostatic hypotension on Florinef 0.1 mg for two weeks when admitted 03/14/15. Held on admission given  borderline high BP. On 03/15/15 added hydralazine prn for SBP > 180 due to HTN. 03/16/15 added norvasc 5 mg daily with hydralzine prn. Cosyntropin stim test normal. 03/18/15 Increased norvasc to 10 mg daily.   Previously reported left side inguinal hernia pt has had since 2013. He has to reduce it at least every other day. Pt has been referred to GI surgery pta.  Recent hip fracture likely osteoporosis secondary to chronic steroid use. S/p left hemi 12/23/14. Now in leg brace secondary to dsilocation and will remain in brace for four weeks.      Patient's medical record from  [facility] hospital has been reviewed by the rehabilitation admission coordinator and physician. NIH Stroke scale: Glascow Coma Scale:  Past Medical History  Past Medical History  Diagnosis Date  . GERD (gastroesophageal reflux disease)   . Allergy     heyfever  . Hypertension   . Palpitation   . Elevated platelet count (HCC)     on hydroxyurea  . Heart murmur     Family History   family history includes Cancer in his father and mother; Transient ischemic attack in his sister. There is no history of Colon cancer.  Prior Rehab/Hospitalizations Has the patient had major surgery during 100 days prior to admission? Yes  HIp fx 11/16 with partial hip replacement 12/23/14 at [facility]  Current Medications Amlodipine Desonide Famotidine Heparin Pentamidine Prednisone Tacrolimus valacyclovit  Patients Current Diet:   Regular with thin liquids  Precautions / Restrictions Precautions Precautions: Other (comment), Fall (neutropenic precautions) Restrictions Weight Bearing Restrictions: No Other Position/Activity Restrictions: LLE brace at all times to prevent hip dislocation   Has the patient had 2 or more falls or a fall with injury in the past year?Yes Hip fx related to steroid use not fall Has fallen twice in past two months  Prior Activity Level Limited Community (1-2x/wk): limited since hip fx 12/2014. 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 His wife has recently had to assist with supervision for transfers and some assistnce with bathing and dressing due to hip dislocation and LLE brace. Pt does particiapate in family business with wife and daughter, Dian Situ for court stenographer for depositions  Prior Functional Level Self Care: Did the patient need help bathing, dressing, using the toilet or eating?  Needed some help  Indoor Mobility: Did the patient need assistance  with walking from room to room (with or without device)? Needed some help  Stairs: Did the patient need assistance with internal or external stairs (with or without device)? Needed some help  Functional Cognition: Did the patient need help planning regular tasks such as shopping or remembering to take medications? Independent  Home Assistive Devices / Equipment Home Assistive Devices/Equipment: Bedside commode/3-in-1, Environmental consultant (specify type), Cane (specify quad or straight)  Prior Device Use: Indicate devices/aids used by the patient prior to current illness, exacerbation or injury? Walker   Prior Functional Level Current Functional Level  Bed Mobility  Mod Independent  Min assist. Tremors in B hands. Needs extra time for supine to sit EOB.   Transfers  Mod Independent. Prior to hip fx 11/16  Min assist (sits unsupported EOB with min due to posterior lean)   Mobility - Walk/Wheelchair  Mod Independent  Min assist (ambulated 15 ft using RW with min assist initially and progr)resses to mod assist as he fatigues. Forward flexed posture, slow cadence, decreased step length and narrow BOS. Seated rest break between ambulation  trials due to pt's report of fatigue and LE weakness.  Upper Body Dressing  Min assistdoes own adls but progressively needed more assist for transfers and close guarding with ambulation.   Min assist   Lower Body Dressing  Min assist due to LLE brace  Mod assist   Grooming  Independent  Min assist   Eating/Drinking  Independent  Mod Independent   Toilet Transfer  Mod Independent  Min assist   Bladder Continence   continent  continent/uses urinal   Bowel Management  continent  No BM since admit 03/14/15   Stair Climbing      (not attempted)   Communication  independent  independent   Memory  intact  intact   Cooking/Meal Prep  wife      Housework  wife    Money Management       Driving         Special needs/care  consideration Skin wife reports some abrasions to arms due to recent falls. Very dry skin and history of rashes Bowel mgmt: no BM since admit 03/14/15. Bladder mgmt: continent/urinal  Previous Home Environment Living Arrangements: Spouse/significant other  Lives With: Spouse Available Help at Discharge: Family, Available 24 hours/day Type of Home: House Home Layout: One level Home Access: Ramped entrance Bathroom Shower/Tub: Multimedia programmer: Handicapped height Bathroom Accessibility: Yes How Accessible: Accessible via walker [place]: No Additional Comments: decline in function since 11/16 Hip fx 11/16 due to steroids not fall or injury per wife  Discharge Living Setting Plans for Discharge Living Setting: Patient's home, Lives with (comment) (wife) Type of Home at Discharge: House Discharge Home Layout: One level Discharge Home Access: [place] entrance Discharge Bathroom Shower/Tub: Walk-in shower Discharge Bathroom Toilet: Handicapped height Discharge Bathroom Accessibility: Yes How Accessible: Accessible via walker Does the patient have any problems obtaining your medications?: No  Social/Family/Support Systems Patient Roles: Spouse, Parent, Other (Comment) (self employed home business for court depostions) Contact Information: Merlene Laughter, wife Anticipated Caregiver: wife, and daughter, Dian Situ and wife's sister Anticipated Ambulance person Information: see above Ability/Limitations of Caregiver: wife  and dtr, Dian Situ work in family business but can coordinate care to provide 24/7 supervision Caregiver Availability: 24/7 Discharge Plan Discussed with Primary Caregiver: Yes Is Caregiver In Agreement with Plan?: Yes Does Caregiver/Family have Issues with Lodging/Transportation while Pt is in Rehab?: No  Goals/Additional Needs Patient/Family Goal for Rehab: Mod I to supervision with PT, supervision to min assist with OT Expected length of stay: ELOS  10-12 days Pt/Family Agrees to Admission and willing to participate: Yes Program Orientation Provided & Reviewed with Pt/Caregiver Including Roles  & Responsibilities: Yes  Patient Condition: I have reviewed all medical records provided from [facility] in [place], [place] and spoke with social worker and wife by phone. Patient will benefit from ongoing PT and OT, Rehabilitative Nursing and Rehabilitative Medicaine. Also benefit from the coordinated team approach as he receives 3 hours of therapy a day with MD and Nursing interventions. He is currently min to mod assist with all adls and mobility. We will admit pt to acute inpatient rehab today.  Preadmission Screen Completed By:  Cleatrice Burke, 03/18/2015 1:02 PM ______________________________________________________________________   Discussed status with Dr. Naaman Plummer  on  03/18/2015 at 1302 and received telephone approval for admission today.  Admission Coordinator:  Cleatrice Burke, time  1302 Date 03/18/2015.   Assessment/Plan: Diagnosis: debility related to multiple medical  1. Does the need for close, 24 hr/day  Medical  supervision in concert with the patient's rehab needs make it unreasonable for this patient to be served in a less intensive setting? Yes 2. Co-Morbidities requiring supervision/potential complications: MF, peripheral neuropathy, left hip fx/hemi 3. Due to bladder management, bowel management, safety, skin/wound care, disease management, medication administration, pain management and patient education, does the patient require 24 hr/day rehab nursing? Yes 4. Does the patient require coordinated care of a physician, rehab nurse, PT (1-2 hrs/day, 5 days/week) and OT (1-2 hrs/day, 5 days/week) to address physical and functional deficits in the context of the above medical diagnosis(es)? Yes Addressing deficits in the following areas: balance, endurance, locomotion, strength, transferring, bowel/bladder control,  bathing, dressing, feeding, grooming, toileting and psychosocial support 5. Can the patient actively participate in an intensive therapy program of at least 3 hrs of therapy 5 days a week? Yes 6. The potential for patient to make measurable gains while on inpatient rehab is excellent 7. Anticipated functional outcomes upon discharge from inpatients are: modified independent PT, modified independent OT, n/a SLP 8. Estimated rehab length of stay to reach the above functional goals is: 10 days 9. Does the patient have adequate social supports to accommodate these discharge functional goals? Yes 10. Anticipated D/C setting: Home 11. Anticipated post D/C treatments: [place] therapy 12. Overall Rehab/Functional Prognosis: excellent    RECOMMENDATIONS: This patient's condition is appropriate for continued rehabilitative care in the following setting: CIR Patient has agreed to participate in recommended program. Yes Note that insurance prior authorization may be required for reimbursement for recommended care.  Comment: Admit to inpatient rehab today   Meredith Staggers, MD, [place] 03/18/2015  Cleatrice Burke 03/18/2015

## 2015-03-18 NOTE — H&P (View-Only) (Signed)
Physical Medicine and Rehabilitation Admission H&P    Chief complaint: Weakness  HPI: 70 year old right-handed male with history of hypertension, recent hip fracture November 2016 secondary to osteoporosis complicated by dislocation with brace applied and posterior hip precautions, severe polyneuropathy felt to be secondary dapsone and tacrolimus that were adjusted and recent EMG completed, myelofibrosis status post bone marrow transplant 2015 followed by Dr. Virgina Norfolk [phone] oncology services at [facility]. Patient lives with his wife in [place]. Independent with rolling walker since hip fracture in November 2016. Wife and daughter can assist. Presented 03/14/2015 to [facility] with acute weakness and inability to walk or stand. He recently completed course of antibiotics for pneumonia. Noted recent fall sustaining abrasion to right forearm. Admission labs unremarkable except mild hypokalemia 3.4. Acute on chronic anemia 8.5. MRI brain cervical spine unremarkable. C-spine films. Degenerative disease C4-C5. Patient with bouts of orthostasis he did require Florinef and Norvasc recently discontinued and later resumed with Florinef discontinued. Patient requiring min mod assist for functional mobility. Admitted for comprehensive rehabilitation program  Review of Systems  Constitutional: Positive for malaise/fatigue. Negative for fever and chills.  HENT: Negative for hearing loss.   Eyes: Negative for blurred vision and double vision.  Respiratory: Negative for cough.        Shortness of breath with exertion  Cardiovascular: Positive for palpitations and leg swelling. Negative for chest pain.  Gastrointestinal: Positive for constipation. Negative for nausea and vomiting.       GERD  Genitourinary: Positive for urgency. Negative for hematuria.  Musculoskeletal: Positive for myalgias, joint pain and falls.  Skin: Negative for rash.  Neurological: Positive for  weakness. Negative for seizures, loss of consciousness and headaches.  Psychiatric/Behavioral: Positive for depression.  All other systems reviewed and are negative.  Past Medical History  Diagnosis Date  . GERD (gastroesophageal reflux disease)   . Allergy     heyfever  . Hypertension   . Palpitation   . Elevated platelet count (HCC)     on hydroxyurea  . Heart murmur    Past Surgical History  Procedure Laterality Date  . Hernia repair    . Total hip arthroplasty     Family History  Problem Relation Age of Onset  . Ovarian cancer    . Uterine cancer    . Colitis      crohns  . Cancer Mother   . Cancer Father   . Transient ischemic attack Sister     Aortic valve replacement  . Colon cancer Neg Hx    Social History:  reports that he has never smoked. He has never used smokeless tobacco. He reports that he does not drink alcohol or use illicit drugs. Allergies: No Known Allergies  Medications prior to admission. Norvasc 5 mg daily, Pepcid 20 mg daily, prednisone 10 mg daily,Tacrolimus 1 mg twice a day, dicyclomine 500 mg daily.  Home: Patient lives with his wife. One level home with ramp. Wife local daughter can assist.     Functional History:   independent with rolling walker prior to admission as well as hip brace after recent hip dislocation with posterior hip precautions.  Functional Status:  Mobility: Min/ moderate assist for functional mobility          ADL: Min mod assist activities daily living needing assistance for lower body dressing    Cognition:wnl      Physical Exam: 102/60 P74 RR18 T98 Physical Exam  Constitutional: He is oriented to person,  place, and time. No distress.  Frail appearing  HENT:  Head: Normocephalic and atraumatic.  Eyes: Conjunctivae and EOM are normal. Pupils are equal, round, and reactive to light.  Neck: Normal range of motion. Neck supple. No JVD present. No tracheal deviation present. No thyromegaly present.    Cardiovascular: Normal rate and regular rhythm.   No murmur heard. Systolic click  Respiratory: Effort normal and breath sounds normal. No respiratory distress. He has no wheezes. He has no rales.  GI: Soft. Bowel sounds are normal. He exhibits no distension. There is no tenderness. There is no rebound.  Musculoskeletal: He exhibits no edema or tenderness.  Left hip brace in place---appears to be fitting appropriately.   Neurological: He is alert and oriented to person, place, and time. No cranial nerve deficit.  UE strength 4+/5 prox to distal. LLE: HF 3+, KE 4, ADF/PF 4/5. RLE: HF 4, KE 4+, ADF/PF 5/5. Decreased LT/PP below the mid shin on both legs. DTR's 1+. Cognitively appropriate.   Skin: Skin is warm and dry. He is not diaphoretic.  Dry flaking skin throughout both upper and lower extremities. Skin much more involved than hands. No frank breakdown.   Psychiatric: He has a normal mood and affect. His behavior is normal. Thought content normal.    No results found for this or any previous visit (from the past 48 hour(s)). No results found.     Medical Problem List and Plan: 1.  Weakness/myelopathy, debilitation secondary to myelofibrosis. Have contacted Dr. Julien Nordmann oncology services regarding patient's admission to inpatient rehab.  -no immediate issues that require his attention 2.  DVT Prophylaxis/Anticoagulation: SCDs. Monitor for any signs of DVT. OOB/ambulation 3. Pain Management: Tylenol as needed 4. Hypertension. Norvasc 10 mg daily. Monitor with increased mobility 5. Neuropsych: This patient is capable of making decisions on his own behalf. 6. Skin/Wound Care: Routine skin checks  -continue topical rx 7. Fluids/Electrolytes/Nutrition: Routine I&O with follow-up chemistries. Encourage PO 8. History of hip fracture with recurrent dislocation. Hip brace at all times with hip precautions.(has another month in brace) Weightbearing as tolerated. 9. GERD. Pepcid 10. BPH.  Thoracic care 10 mg daily. 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 Constipation: sorbitol on admit. Suppository if needed  -daily senokot-s  Post Admission Physician Evaluation: 1. Functional deficits secondary  to Weakness, gait deficits, debility due to myelofibrosis and multiple medical issues, peripheral neuropathy. 2. Patient is admitted to receive collaborative, interdisciplinary care between the physiatrist, rehab nursing staff, and therapy team. 3. Patient's level of medical complexity and substantial therapy needs in context of that medical necessity cannot be provided at a lesser intensity of care such as a SNF. 4. Patient has experienced substantial functional loss from his/her baseline which was documented above under the "Functional History" and "Functional Status" headings.  Judging by the patient's diagnosis, physical exam, and functional history, the patient has potential for functional progress which will result in measurable gains while on inpatient rehab.  These gains will be of substantial and practical use upon discharge  in facilitating mobility and self-care at the household level. 5. Physiatrist will provide 24 hour management of medical needs as well as oversight of the therapy plan/treatment and provide guidance as appropriate regarding the interaction of the two. 6. 24 hour rehab nursing will assist with bladder management, bowel management, safety, skin/wound care, disease management, medication administration, pain management and patient education  and help integrate therapy concepts, techniques,education, etc. 7. PT will assess and  treat for/with: Lower extremity strength, range of motion, stamina, balance, functional mobility, safety, adaptive techniques and equipment, NMR, visual-perceptual awareness, vestibular assessment, orthotics, ego support.   Goals are: mod I. 8. OT will assess and treat for/with: ADL's, functional mobility,  safety, upper extremity strength, adaptive techniques and equipment, NMR, visual-perceptual awareness, ego support, family education.   Goals are: mod I. Therapy may proceed with showering this patient if brace can be kept dry. 9. SLP will assess and treat for/with: n/a.  Goals are: n/a. 10. Case Management and Social Worker will assess and treat for psychological issues and discharge planning. 11. Team conference will be held weekly to assess progress toward goals and to determine barriers to discharge. 12. Patient will receive at least 3 hours of therapy per day at least 5 days per week. 13. ELOS: 8-10 days       14. Prognosis:  excellent     Meredith Staggers, MD, [place] [facility] 03/18/2015   03/18/2015

## 2015-03-19 ENCOUNTER — Inpatient Hospital Stay (HOSPITAL_COMMUNITY): Payer: Medicare Other | Admitting: Occupational Therapy

## 2015-03-19 ENCOUNTER — Inpatient Hospital Stay (HOSPITAL_COMMUNITY): Payer: Medicare Other | Admitting: Physical Therapy

## 2015-03-19 DIAGNOSIS — S73005S Unspecified dislocation of left hip, sequela: Secondary | ICD-10-CM

## 2015-03-19 DIAGNOSIS — G622 Polyneuropathy due to other toxic agents: Secondary | ICD-10-CM

## 2015-03-19 DIAGNOSIS — R5381 Other malaise: Secondary | ICD-10-CM

## 2015-03-19 DIAGNOSIS — D7581 Myelofibrosis: Secondary | ICD-10-CM

## 2015-03-19 LAB — URINE MICROSCOPIC-ADD ON
BACTERIA UA: NONE SEEN
RBC / HPF: NONE SEEN RBC/hpf (ref 0–5)
SQUAMOUS EPITHELIAL / LPF: NONE SEEN

## 2015-03-19 LAB — URINALYSIS, ROUTINE W REFLEX MICROSCOPIC
BILIRUBIN URINE: NEGATIVE
Glucose, UA: 100 mg/dL — AB
HGB URINE DIPSTICK: NEGATIVE
Ketones, ur: NEGATIVE mg/dL
Leukocytes, UA: NEGATIVE
Nitrite: NEGATIVE
PH: 7 (ref 5.0–8.0)
Protein, ur: 30 mg/dL — AB
SPECIFIC GRAVITY, URINE: 1.016 (ref 1.005–1.030)

## 2015-03-19 MED ORDER — VITAMIN D 1000 UNITS PO TABS
1000.0000 [IU] | ORAL_TABLET | Freq: Every day | ORAL | Status: DC
  Administered 2015-03-19 – 2015-03-26 (×8): 1000 [IU] via ORAL
  Filled 2015-03-19 (×8): qty 1

## 2015-03-19 NOTE — Evaluation (Signed)
Physical Therapy Assessment and Plan  Patient Details  Name: Thomas Cherry MRN: [id number] Date of Birth: 1945-03-12  PT Diagnosis: Difficulty walking, Impaired sensation and Muscle weakness Rehab Potential: Good ELOS: 7-9 days   Today's Date: 03/19/2015 PT Individual Time: 7035-0093 PT Individual Time Calculation (min): 80 min    Problem List:  Patient Active Problem List   Diagnosis Date Noted  . Debility 03/18/2015  . 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 Skin tear of elbow without complication 81/82/9937  . Hip dislocation, left ([place]) 01/23/2015  . Myelofibrosis ([place]) 06/17/2013  . Nonspecific abnormal electrocardiogram (ECG) (EKG) 03/26/2013  . Seborrhea 03/24/2012  . Palpitation   . Essential hypertension   . 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 Essential thrombocythemia ([place]) 12/28/2010  . Rash 11/24/2010  . Toe pain, left 11/16/2010  . Thrombocytosis ([place]) 11/16/2010  . Personal history of colonic polyps 06/14/2010  . Screening for prostate cancer 06/02/2010  . Pure hypercholesterolemia 06/02/2010  . Inguinal hernia 06/02/2010  . Encounter for long-term (current) use of other medications 06/02/2010  . CERUMEN IMPACTION, RIGHT 03/26/2008  . GERD 10/11/2006    Past Medical History:  Past Medical History  Diagnosis Date  . GERD (gastroesophageal reflux disease)   . Allergy     heyfever  . Hypertension   . Palpitation   . Elevated platelet count (HCC)     on hydroxyurea  . Heart murmur    Past Surgical History:  Past Surgical History  Procedure Laterality Date  . Hernia repair    . Total hip arthroplasty      Assessment & Plan Clinical Impression: 70 year old right-handed male with history of hypertension, recent hip fracture November 2016 secondary to osteoporosis complicated by dislocation with brace applied and posterior hip precautions, severe polyneuropathy felt to be secondary dapsone and tacrolimus that  were adjusted and recent EMG completed, myelofibrosis status post bone marrow transplant 2015 followed by Dr. Virgina Norfolk [phone] oncology services at [facility]. Patient lives with his wife in [place]. Independent with rolling walker since hip fracture in November 2016. Wife and daughter can assist. Presented 03/14/2015 to [facility] with acute weakness and inability to walk or stand. He recently completed course of antibiotics for pneumonia. Noted recent fall sustaining abrasion to right forearm. Admission labs unremarkable except mild hypokalemia 3.4. Acute on chronic anemia 8.5. MRI brain cervical spine unremarkable. C-spine films. Degenerative disease C4-C5. Patient with bouts of orthostasis he did require Florinef and Norvasc recently discontinued and later resumed with Florinef discontinued. Patient requiring min mod assist for functional mobility.  Patient transferred to CIR on 03/18/2015.   Patient currently requires min with mobility secondary to muscle weakness and muscle joint tightness, decreased cardiorespiratoy endurance and decreased standing balance.  Prior to hospitalization, patient was independent  with mobility and lived with Spouse in a House home.  Home access is  Ramped entrance (back entrance).  Patient will benefit from skilled PT intervention to maximize safe functional mobility, minimize fall risk and decrease caregiver burden for planned discharge home with intermittent assist.  Anticipate patient will benefit from follow up [facility] at discharge.  PT - End of Session Activity Tolerance: Decreased this session;Tolerates 30+ min activity with multiple rests Endurance Deficit: Yes Endurance Deficit Description: fatigued quickly with mobility requiring seated rest breaks PT Assessment Rehab Potential (ACUTE/IP ONLY): Good PT Patient demonstrates impairments in the following area(s): Balance;Behavior;Endurance;Motor;Nutrition;Pain;Safety PT Transfers  Functional Problem(s): Bed Mobility;Bed to Chair;Car;Furniture PT Locomotion Functional  Problem(s): Ambulation;Wheelchair Mobility;Stairs PT Plan PT Intensity: Minimum of 1-2 x/day ,45 to 90 minutes PT Frequency: 5 out of 7 days PT Duration Estimated Length of Stay: 7-9 days PT Treatment/Interventions: Ambulation/gait training;Balance/vestibular training;Community reintegration;Discharge planning;Disease management/prevention;DME/adaptive equipment instruction;Functional mobility training;Neuromuscular re-education;Pain management;Patient/family education;Psychosocial support;Stair training;Therapeutic Activities;Therapeutic Exercise;UE/LE Coordination activities;UE/LE Strength taining/ROM;Wheelchair propulsion/positioning PT Transfers Anticipated Outcome(s): mod I PT Locomotion Anticipated Outcome(s): supervision PT Recommendation Follow Up Recommendations: Home health PT Patient destination: Home Equipment Recommended: None recommended by PT Equipment Details: patient owns RW  Skilled Therapeutic Intervention Skilled therapeutic intervention initiated after completion of evaluation. Discussed with patient falls risk, safety within room, and focus of therapy during stay. Discussed possible length of stay, goals, and follow-up therapy. Patient currently requires min A overall using RW up to 80 ft and for 12 stairs with 2 rails. Switched out wheelchair and cushion for improved postural control and sitting tolerance. Patient somewhat impulsive with functional mobility, education provided on safety precautions and decreasing speed of movement especially with transitional movements due to orthostatic hypotension. Seated BP 101/56, standing BP 86/41, patient asymptomatic and RN notified. Patient required frequent rest breaks due to fatigue/SOB. Patient left sitting in recliner with BLE elevated and all needs within reach.   PT Evaluation Precautions/Restrictions Precautions Precautions: Posterior  Hip;Fall Required Braces or Orthoses: Other Brace/Splint Other Brace/Splint: LLE brace at all times Restrictions Weight Bearing Restrictions: Yes LLE Weight Bearing: Weight bearing as tolerated General Chart Reviewed: Yes Family/Caregiver Present: No Vital SignsTherapy Vitals Patient Position (if appropriate): Orthostatic Vitals Pain Pain Assessment Pain Assessment: No/denies pain Home Living/Prior Functioning Home Living Available Help at Discharge: Family;Available 24 hours/day Type of Home: House Home Access: Ramped entrance (back entrance) Home Layout: One level Bathroom Shower/Tub: Multimedia programmer: Standard Bathroom Accessibility: Yes Additional Comments: 3 falls in last year  Lives With: Spouse Prior Function Level of Independence: Independent with basic ADLs;Independent with gait;Independent with transfers  Able to Take Stairs?: Yes Driving: Yes Leisure: Hobbies-yes (Comment) Comments: enjoys playing with grandchildren  Vision/Perception   No changes from baseline  Cognition Overall Cognitive Status: Within Functional Limits for tasks assessed Arousal/Alertness: Awake/alert Orientation Level: Oriented X4 Sensation Sensation Light Touch: Impaired Detail Light Touch Impaired Details: Impaired LLE;Impaired RLE Stereognosis: Not tested Hot/Cold: Appears Intact Proprioception: Appears Intact Additional Comments: numbness/decreased LT from knee down L > R Motor  Motor Motor: Within Functional Limits Motor - Skilled Clinical Observations: generalized weakness/deconditioning  Mobility Bed Mobility Bed Mobility: Rolling Right;Rolling Left;Sit to Supine;Supine to Sit Rolling Right: 5: Supervision Rolling Left: 5: Supervision Supine to Sit: 5: Supervision;HOB flat Sit to Supine: 5: Supervision;HOB flat Transfers Transfers: Yes Sit to Stand: 4: Min guard;With upper extremity assist Stand to Sit: 4: Min guard;With upper extremity assist Stand Pivot  Transfers: 4: Min assist;With armrests Locomotion  Ambulation Ambulation: Yes Ambulation/Gait Assistance: 4: Min assist Ambulation Distance (Feet): 80 Feet Assistive device: Rolling walker Ambulation/Gait Assistance Details: Verbal cues for safe use of DME/AE;Verbal cues for precautions/safety Gait Gait: Yes Gait Pattern: Impaired Gait Pattern: Lateral hip instability;Decreased weight shift to left;Step-through pattern Stairs / Additional Locomotion Stairs: Yes Stairs Assistance: 4: Min guard Stair Management Technique: Two rails;Alternating pattern;Forwards Number of Stairs: 12 Height of Stairs: 3 (8 3", 4 6" stairs) Ramp: Not tested (comment) Curb: Not tested (comment) Wheelchair Mobility Wheelchair Mobility: Yes Wheelchair Assistance: 4: Min assist;5: Investment banker, operational Details: Verbal cues for sequencing;Verbal cues for technique;Verbal cues for Information systems manager: Both upper extremities Wheelchair Parts Management: Needs assistance Distance: 150  Trunk/Postural  Assessment  Cervical Assessment Cervical Assessment: Within Functional Limits Thoracic Assessment Thoracic Assessment: Within Functional Limits Lumbar Assessment Lumbar Assessment: Within Functional Limits Postural Control Postural Control: Within Functional Limits  Balance Balance Balance Assessed: Yes Static Standing Balance Static Standing - Balance Support: During functional activity;No upper extremity supported Static Standing - Level of Assistance: 5: Stand by assistance Dynamic Standing Balance Dynamic Standing - Balance Support: During functional activity;Bilateral upper extremity supported Dynamic Standing - Level of Assistance: 4: Min assist Extremity Assessment  RLE Assessment RLE Assessment: Within Functional Limits LLE Assessment LLE Assessment: Exceptions to [facility] LLE Strength LLE Overall Strength: Deficits LLE Overall Strength Comments: hip flexion NT  due to precautions, knee flexion/extension and ankle DF 5/5   See Function Navigator for Current Functional Status.   Refer to Care Plan for Long Term Goals  Recommendations for other services: None  Discharge Criteria: Patient will be discharged from PT if patient refuses treatment 3 consecutive times without medical reason, if treatment goals not met, if there is a change in medical status, if patient makes no progress towards goals or if patient is discharged from hospital.  The above assessment, treatment plan, treatment alternatives and goals were discussed and mutually agreed upon: by patient  Laretta Alstrom 03/19/2015, 12:52 PM

## 2015-03-19 NOTE — Evaluation (Signed)
Occupational Therapy Assessment and Plan  Patient Details  Name: Thomas Cherry MRN: [id number] Date of Birth: 1945-08-12  OT Diagnosis: cognitive deficits and muscle weakness (generalized) Rehab Potential: Rehab Potential (ACUTE ONLY): Excellent ELOS: 7-9 days   Today's Date: 03/19/2015 OT Individual Time: 1300-1400 OT Individual Time Calculation (min): 60 min     Problem List:  Patient Active Problem List   Diagnosis Date Noted  . Debility 03/18/2015  . 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 Skin tear of elbow without complication 93/81/8299  . Hip dislocation, left ([place]) 01/23/2015  . Myelofibrosis ([place]) 06/17/2013  . Nonspecific abnormal electrocardiogram (ECG) (EKG) 03/26/2013  . Seborrhea 03/24/2012  . Palpitation   . Essential hypertension   . 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 Essential thrombocythemia ([place]) 12/28/2010  . Rash 11/24/2010  . Toe pain, left 11/16/2010  . Thrombocytosis ([place]) 11/16/2010  . Personal history of colonic polyps 06/14/2010  . Screening for prostate cancer 06/02/2010  . Pure hypercholesterolemia 06/02/2010  . Inguinal hernia 06/02/2010  . Encounter for long-term (current) use of other medications 06/02/2010  . CERUMEN IMPACTION, RIGHT 03/26/2008  . GERD 10/11/2006    Past Medical History:  Past Medical History  Diagnosis Date  . GERD (gastroesophageal reflux disease)   . Allergy     heyfever  . Hypertension   . Palpitation   . Elevated platelet count (HCC)     on hydroxyurea  . Heart murmur    Past Surgical History:  Past Surgical History  Procedure Laterality Date  . Hernia repair    . Total hip arthroplasty      Assessment & Plan Clinical Impression: 70 year old right-handed male with history of hypertension, recent hip fracture November 2016 secondary to osteoporosis complicated by dislocation with brace applied and posterior hip precautions, severe polyneuropathy felt to be  secondary dapsone and tacrolimus that were adjusted and recent EMG completed, myelofibrosis status post bone marrow transplant 2015 followed by Dr. Virgina Norfolk [phone] oncology services at [facility]. Patient lives with his wife in [place]. Independent with rolling walker since hip fracture in November 2016. Wife and daughter can assist. Presented 03/14/2015 to [facility] with acute weakness and inability to walk or stand. He recently completed course of antibiotics for pneumonia. Noted recent fall sustaining abrasion to right forearm. Admission labs unremarkable except mild hypokalemia 3.4. Acute on chronic anemia 8.5. MRI brain cervical spine unremarkable. C-spine films. Degenerative disease C4-C5. Patient with bouts of orthostasis he did require Florinef and Norvasc recently discontinued and later resumed with Florinef discontinued. Patient requiring min mod assist for functional mobility. Admitted for comprehensive rehabilitation program.  Patient transferred to CIR on 03/18/2015 .    Patient currently requires min with basic self-care skills secondary to muscle weakness, decreased cardiorespiratoy endurance and decreased standing balance and decreased balance strategies.  Prior to hospitalization, patient could complete basic ADLs with modified independent .  Patient will benefit from skilled intervention to increase independence with basic self-care skills prior to discharge home with care partner.  Anticipate patient will require 24 hour supervision and follow up home health.  OT - End of Session Activity Tolerance: Tolerates 10 - 20 min activity with multiple rests Endurance Deficit: Yes Endurance Deficit Description: fatigued quickly with mobility requiring seated rest breaks OT Assessment Rehab Potential (ACUTE ONLY): Excellent OT Patient demonstrates impairments in the following area(s): Balance;Cognition;Endurance;Motor;Safety;Sensory;Skin Integrity OT Basic  ADL's Functional Problem(s): Grooming;Bathing;Dressing;Toileting OT Transfers Functional Problem(s): Toilet;Tub/Shower OT Additional Impairment(s): None  OT Plan OT Intensity: Minimum of 1-2 x/day, 45 to 90 minutes OT Frequency: 5 out of 7 days OT Duration/Estimated Length of Stay: 7-9 days OT Treatment/Interventions: Balance/vestibular training;Discharge planning;Cognitive remediation/compensation;DME/adaptive equipment instruction;Functional mobility training;Patient/family education;Self Care/advanced ADL retraining;Therapeutic Activities;Therapeutic Exercise;UE/LE Strength taining/ROM;UE/LE Coordination activities OT Self Feeding Anticipated Outcome(s): I OT Basic Self-Care Anticipated Outcome(s): supervision OT Toileting Anticipated Outcome(s): supervision OT Bathroom Transfers Anticipated Outcome(s): supervision OT Recommendation Patient destination: Home Follow Up Recommendations: Home health OT Equipment Recommended: None recommended by OT   Skilled Therapeutic Intervention Pt seen for initial evaluation. Reviewed purpose of OT, pt's goals, OT POC and recommendations with 70 pt while pt's daughter was present. Pt then engaged in ADL retraining. Discussed with pt the MD orders to have L hip brace on at all times. Plan was to walk into shower then doff brace and LB clothing in shower and redon after shower while on tub bench. Pt very insistent that he has had the brace since November and takes it off in his room and walks into bathroom to shower.  Allowed pt to do that but with mod cues to take very small steps and small rotational turns.  Pt used long sponge in shower to reach feet and lateral leans for bottom. Frequent cues to not lean to far forward. Used reacher for pants with cues again as he tried to over flex LLE. Pt with no dizziness. Due to low blood pressure earlier, pt provided with thigh high TED hose.  Pt tolerated treatment well. Reviewed safety to not get up by himself. Pt in room  with all needs met.  OT Evaluation Precautions/Restrictions  Precautions Precautions: Posterior Hip;Fall Required Braces or Orthoses: Other Brace/Splint Other Brace/Splint: LLE brace at all times Restrictions Weight Bearing Restrictions: Yes LLE Weight Bearing: Weight bearing as tolerated Other Position/Activity Restrictions: LLE brace at all times to prevent hip dislocation    Vital Signs Therapy Vitals Temp: 98.2 F (36.8 C) Temp Source: Oral Pulse Rate: 96 Resp: 18 BP: (!) 151/84 mmHg Patient Position (if appropriate): Sitting Oxygen Therapy SpO2: 95 % O2 Device: Not Delivered Pain Pain Assessment Pain Assessment: No/denies pain Home Living/Prior Functioning Home Living Available Help at Discharge: Family, Available 24 hours/day Type of Home: House Home Access: Ramped entrance Home Layout: One level Bathroom Shower/Tub: Multimedia programmer: Standard Bathroom Accessibility: Yes Additional Comments: 3 falls in last year  Lives With: Spouse Prior Function Level of Independence: Independent with basic ADLs, Independent with gait, Independent with transfers  Able to Take Stairs?: Yes Driving: Yes Vocation: Retired Leisure: Hobbies-yes (Comment) Comments: enjoys playing with grandchildren  ADL ADL ADL Comments: refer to functional navigator Vision/Perception  Vision- History Baseline Vision/History: No visual deficits Patient Visual Report: No change from baseline Vision- Assessment Vision Assessment?: No apparent visual deficits  Cognition Overall Cognitive Status: Within Functional Limits for tasks assessed Arousal/Alertness: Awake/alert Orientation Level: Person;Place;Situation Person: Oriented Place: Oriented Situation: Oriented Year: 2017 Month: January Day of Week: Incorrect (Tuesday) Memory: Impaired Memory Impairment: Decreased recall of new information;Other (comment) (frequent cues during ADLs to follow hip precautions) Immediate  Memory Recall: Sock;Blue;Bed Memory Recall: Sock;Blue;Bed Memory Recall Sock: Without Cue Memory Recall Blue: Without Cue Memory Recall Bed: Without Cue Awareness: Appears intact Problem Solving: Impaired Problem Solving Impairment: Functional complex (min cues with safe techniques/ strategies during ADLs) Safety/Judgment: Appears intact Sensation Sensation Light Touch Impaired Details: Impaired LLE;Impaired RLE Stereognosis: Appears Intact Hot/Cold: Appears Intact Proprioception: Appears Intact Additional Comments: numbness/decreased LT from knee down L > R Coordination  Gross Motor Movements are Fluid and Coordinated: Yes Fine Motor Movements are Fluid and Coordinated: Yes Motor  Motor Motor: Within Functional Limits Motor - Skilled Clinical Observations: generalized weakness/deconditioning Mobility  Bed Mobility Bed Mobility: Rolling Right;Rolling Left;Sit to Supine;Supine to Sit Rolling Right: 5: Supervision Rolling Left: 5: Supervision Supine to Sit: 5: Supervision;HOB flat Sit to Supine: 5: Supervision;HOB flat Transfers Sit to Stand: 4: Min guard;With upper extremity assist Stand to Sit: 4: Min guard;With upper extremity assist  Trunk/Postural Assessment  Cervical Assessment Cervical Assessment: Within Functional Limits Thoracic Assessment Thoracic Assessment: Within Functional Limits Lumbar Assessment Lumbar Assessment: Within Functional Limits Postural Control Postural Control: Within Functional Limits  Balance Balance Balance Assessed: Yes Static Standing Balance Static Standing - Balance Support: During functional activity;No upper extremity supported Static Standing - Level of Assistance: 5: Stand by assistance Dynamic Standing Balance Dynamic Standing - Balance Support: During functional activity;Bilateral upper extremity supported Dynamic Standing - Level of Assistance: 4: Min assist Extremity/Trunk Assessment RUE Assessment RUE Assessment: Within  Functional Limits LUE Assessment LUE Assessment: Within Functional Limits   See Function Navigator for Current Functional Status.   Refer to Care Plan for Long Term Goals  Recommendations for other services: None  Discharge Criteria: Patient will be discharged from OT if patient refuses treatment 3 consecutive times without medical reason, if treatment goals not met, if there is a change in medical status, if patient makes no progress towards goals or if patient is discharged from hospital.  The above assessment, treatment plan, treatment alternatives and goals were discussed and mutually agreed upon: by patient  [facility] 03/19/2015, 4:34 PM

## 2015-03-19 NOTE — Progress Notes (Signed)
Occupational Therapy Session Note  Patient Details  Name: Thomas Cherry MRN: [id number] Date of Birth: October 07, 1945  Today's Date: 03/19/2015 OT Individual Time:   1600-1700   (60 min) OT Individual Time Calculation (min): 60 min    Short Term Goals: Week 1:  OT Short Term Goal 1 (Week 1): STGs = LTGs due to short LOS  Skilled Therapeutic Interventions/Progress Updates:  Balance/vestibular training;Discharge planning;Cognitive remediation/compensation;DME/adaptive equipment instruction;Functional mobility training;Patient/family education;Self Care/advanced ADL retraining;Therapeutic Activities;Therapeutic Exercise;UE/LE Strength taining/ROM;UE/LE Coordination activities Ambulated from room to gym with 2WW with min assist.  Used nutep for 10 min 4 workload at 613 steps.  Did 5 sit to stand x5 for 3 sets; Shoulder abd/flex 5x.  Did 3 sets of each.  Did functional ambulation in gym with RW and heel toe strides with min to SBA.  Use 3# weights for triceps x10 on each arm.  Left arm weaker than right.  Engaged in balance walking  to room.  Ambulated to bathroom and stood to urinate with SBA.  Needed min cues to stay with walker and not leave it.  Ambulated to wash hands and return to bed with dinner tray set up.    Therapy Documentation Precautions:  Precautions Precautions: Posterior Hip, Fall Required Braces or Orthoses: Other Brace/Splint Other Brace/Splint: LLE brace at all times Restrictions Weight Bearing Restrictions: Yes LLE Weight Bearing: Weight bearing as tolerated Other Position/Activity Restrictions: LLE brace at all times to prevent hip dislocation     Pain:  none ADL ADL Comments: refer to functional navigator   See Function Navigator for Current Functional Status.   Therapy/Group: Individual Therapy  Lisa Roca 03/19/2015, 5:26 PM

## 2015-03-19 NOTE — Progress Notes (Signed)
Patient's blood pressure was 86/41 while standing with therapy at 1133; patient asymptomatic.  Blood pressure was 151/84 while sitting.  Dr. Aretta Nip notified; new order to change patient's TED hose to thigh-high TEDs.  Will continue to monitor.

## 2015-03-19 NOTE — Progress Notes (Signed)
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 Virgina Norfolk [phone] oncology services at [facility]. Patient lives with his wife in [place]. Independent with rolling walker since hip fracture in November 2016. Wife and daughter can assist. Presented 03/14/2015 to [facility] with acute weakness and inability to walk or stand. He recently completed course of antibiotics for pneumonia. Noted recent fall sustaining abrasion to right forearm. Admission labs unremarkable except mild hypokalemia 3.4. Subjective/Complaints: Pt c/o freq urination Was no visicare at home Has mild dysuria  Objective: Vital Signs: Blood pressure 182/81, pulse 90, temperature 98.4 F (36.9 C), temperature source Oral, resp. rate 18, weight 64.864 kg (143 lb). No results found. No results found for this or any previous visit (from the past 72 hour(s)).      Assessment/Plan: 1. 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 Physiatrist is providing close team supervision and 24 hour management of active medical problems listed below. Physiatrist and rehab team continue to assess barriers to discharge/monitor patient progress toward functional and medical goals. FIM:                   Function - Comprehension Comprehension: Auditory Comprehension assist level: Understands complex 90% of the time/cues 10% of the time  Function - Expression Expression: Verbal Expression assist  level: Expresses complex 90% of the time/cues < 10% of the time  Function - Social Interaction Social Interaction assist level: Interacts appropriately 90% of the time - Needs monitoring or encouragement for participation or interaction.  Function - Problem Solving Problem solving assist level: Solves basic problems with no assist  Function - Memory Memory assist level: More than reasonable amount of time Patient normally able to recall (first 3 days only): Current season, Location of own room, Staff names and faces, That he or she is in a hospital  Medical Problem List and Plan: 1.  Weakness/myelopathy, debilitation secondary to myelofibrosis. Have contacted Dr. Julien Nordmann oncology services regarding patient's admission to inpatient rehab.             -no immediate issues that require his attention 2.  DVT Prophylaxis/Anticoagulation: SCDs. Monitor for any signs of DVT. OOB/ambulation 3. Pain Management: Tylenol as needed 4. Hypertension. Norvasc 10 mg daily. Monitor with increased mobility 5. Neuropsych: This patient is capable of making decisions on his own behalf. 6. Skin/Wound Care: Routine skin checks             -continue topical rx 7. Fluids/Electrolytes/Nutrition: Routine I&O with follow-up chemistries. Encourage PO 8. History of hip fracture with recurrent dislocation. Hip brace at all times with hip precautions.(has another month in brace) Weightbearing as tolerated. 9. GERD. Pepcid 10. BPH. Thoracic care 10 mg daily. 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 Constipation: sorbitol on admit. Suppository if needed             -daily senokot-s 13.  Urinary freq with mild dysuria- if UA neg restart vesicare  LOS (Days) 1 A FACE TO FACE EVALUATION WAS PERFORMED  Thomas Cherry E 03/19/2015, 7:01 AM

## 2015-03-20 ENCOUNTER — Inpatient Hospital Stay (HOSPITAL_COMMUNITY): Payer: Medicare Other | Admitting: Physical Therapy

## 2015-03-20 MED ORDER — DARIFENACIN HYDROBROMIDE ER 15 MG PO TB24
15.0000 mg | ORAL_TABLET | Freq: Every day | ORAL | Status: DC
  Administered 2015-03-20 – 2015-03-21 (×2): 15 mg via ORAL
  Filled 2015-03-20 (×4): qty 1

## 2015-03-20 NOTE — Progress Notes (Addendum)
Pt only voids small amounts at a time frequently. Bladder scanned post void = 192. Pt reported he asked for multiple urinals due to frequent small voids.  Thank you, Margarito Liner

## 2015-03-20 NOTE — Progress Notes (Signed)
Physical Therapy Session Note  Patient Details  Name: Thomas Cherry MRN: [id number] Date of Birth: 1945-08-21  Today's Date: 03/20/2015 PT Individual Time: 0800-0855 PT Individual Time Calculation (min): 55 min   Short Term Goals: Week 1:  PT Short Term Goal 1 (Week 1): = LTGs due to anticipated LOS  Skilled Therapeutic Interventions/Progress Updates:    Pt received seated in bed, no c/o pain and agreeable to treatment. Supine>sit with S and bedrails; dons shoes with S. Stand pivot transfer bed >w/c with no AD min guard. W/c propulsion 2 x175' with BUE for strengthening and aerobic endurance. Therapist donned BLE TEDs with maxA due to difficulty reaching feet and impaired coordination/strength BUEs. Gait x90' with min guard and RW; verbal cues for slowing speed and safety when turning corners. Sit <>stand from mat table 2 x 10 reps. Pt unable to stand without UE support even with table elevated, instead performed sit >stand with hands, stand>sit with no hands and focus on slow eccentric control. Assessed TUG and Berg as described below. Patient demonstrates increased fall risk as noted by score of  20/56 on Berg Balance Scale.  (<36= high risk for falls, close to 100%; 37-45 significant >80%; 46-51 moderate >50%; 52-55 lower >25%). Throughout session pt reports occasional dizziness and returned to seated position with improvement in symptoms within 1 min of resting. BP assessed as below. Returned to room with w/c propulsion as described above. Transferred w/c >bed min guard. Remained supine in bed with all needs in reach and alarm intact at completion of session.    Therapy Documentation Precautions:  Precautions Precautions: Posterior Hip, Fall Required Braces or Orthoses: Other Brace/Splint Other Brace/Splint: LLE brace at all times Restrictions Weight Bearing Restrictions: No LLE Weight Bearing: Weight bearing as tolerated Other Position/Activity Restrictions: LLE brace at all times to  prevent hip dislocation Vital Signs: Therapy Vitals Temp: 98.3 F (36.8 C) Temp Source: Oral Pulse Rate: (!) 102 Resp: 18 BP: 121/69 mmHg Patient Position (if appropriate): Sitting Oxygen Therapy SpO2: 98 % O2 Device: Not Delivered Pain: Pain Assessment Pain Assessment: No/denies pain  Balance: Balance Balance Assessed: Yes Standardized Balance Assessment Standardized Balance Assessment: Berg Balance Test;Timed Up and Go Test Berg Balance Test Sit to Stand: Able to stand using hands after several tries Standing Unsupported: Able to stand 2 minutes with supervision Sitting with Back Unsupported but Feet Supported on Floor or Stool: Able to sit safely and securely 2 minutes Stand to Sit: Sits independently, has uncontrolled descent Transfers: Able to transfer with verbal cueing and /or supervision Standing Unsupported with Eyes Closed: Able to stand 10 seconds with supervision Standing Ubsupported with Feet Together: Able to place feet together independently but unable to hold for 30 seconds From Standing, Reach Forward with Outstretched Arm: Reaches forward but needs supervision From Standing Position, Pick up Object from Floor: Unable to try/needs assist to keep balance From Standing Position, Turn to Look Behind Over each Shoulder: Needs supervision when turning Turn 360 Degrees: Needs close supervision or verbal cueing Standing Unsupported, Alternately Place Feet on Step/Stool: Needs assistance to keep from falling or unable to try Standing Unsupported, One Foot in Front: Loses balance while stepping or standing Standing on One Leg: Unable to try or needs assist to prevent fall Total Score: 20 Timed Up and Go Test TUG: Normal TUG Normal TUG (seconds): 19.5   See Function Navigator for Current Functional Status.   Therapy/Group: Individual Therapy  Luberta Mutter 03/20/2015, 8:58 AM

## 2015-03-20 NOTE — Progress Notes (Signed)
Subjective/Complaints: Pt c/o freq urination Was no vesicare at home as well as flomax, off flomax due to orthostasis Has mild dysuria  Objective: Vital Signs: Blood pressure 166/87, pulse 91, temperature 98.3 F (36.8 C), temperature source Oral, resp. rate 18, weight 64.864 kg (143 lb), SpO2 98 %. No results found. Results for orders placed or performed during the hospital encounter of 03/18/15 (from the past 72 hour(s))  Urinalysis, Routine w reflex microscopic (not at [facility])     Status: Abnormal   Collection Time: 03/19/15  3:31 PM  Result Value Ref Range   Color, Urine YELLOW YELLOW   APPearance CLEAR CLEAR   Specific Gravity, Urine 1.016 1.005 - 1.030   pH 7.0 5.0 - 8.0   Glucose, UA 100 (A) NEGATIVE mg/dL   Hgb urine dipstick NEGATIVE NEGATIVE   Bilirubin Urine NEGATIVE NEGATIVE   Ketones, ur NEGATIVE NEGATIVE mg/dL   Protein, ur 30 (A) NEGATIVE mg/dL   Nitrite NEGATIVE NEGATIVE   Leukocytes, UA NEGATIVE NEGATIVE  Urine microscopic-add on     Status: None   Collection Time: 03/19/15  3:31 PM  Result Value Ref Range   Squamous Epithelial / LPF NONE SEEN NONE SEEN   WBC, UA 0-5 0 - 5 WBC/hpf   RBC / HPF NONE SEEN 0 - 5 RBC/hpf   Bacteria, UA NONE SEEN NONE SEEN        Assessment/Plan: 1. 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 Physiatrist is providing close team supervision and 24 hour management of active medical problems listed below. Physiatrist and rehab team continue to assess barriers to discharge/monitor patient progress toward functional and medical goals. FIM: Function - Bathing Position: Shower Body parts bathed by patient: Right arm, Left arm, Chest, Abdomen, Front perineal area, Buttocks, Right upper leg, Left upper leg, Right lower leg, Left lower leg Assist Level:  Assistive device, Touching or steadying assistance(Pt > 75%) Assistive Device Comment: long handled sponge  Function- Upper Body Dressing/Undressing What is the patient wearing?: Pull over shirt/dress Pull over shirt/dress - Perfomed by patient: Thread/unthread right sleeve, Thread/unthread left sleeve, Put head through opening, Pull shirt over trunk Function - Lower Body Dressing/Undressing What is the patient wearing?: Pants, Ted Hose, Non-skid slipper socks, Underwear Position: Wheelchair/chair at [employer] - Performed by patient: Thread/unthread right underwear leg, Thread/unthread left underwear leg, Pull underwear up/down Pants- Performed by patient: Pull pants up/down, Thread/unthread right pants leg, Thread/unthread left pants leg Non-skid slipper socks- Performed by helper: Don/doff right sock, Don/doff left sock TED Hose - Performed by helper: Don/doff right TED hose, Don/doff left TED hose Assist for lower body dressing: Assistive device, Touching or steadying assistance (Pt > 75%) Assistive Device Comment: reacher  Function - Toileting Toileting steps completed by patient: Performs perineal hygiene, Adjust clothing prior to toileting, Adjust clothing after toileting Toileting Assistive Devices: Grab bar or rail Assist level: Touching or steadying assistance (Pt.75%)  Function - Air cabin crew transfer assistive device: Grab bar, Walker Assist level to toilet: Touching or steadying assistance (Pt > 75%) Assist level from toilet: Touching or steadying assistance (Pt > 75%)  Function - Chair/bed transfer Chair/bed transfer method: Stand pivot, Ambulatory Chair/bed transfer assist level: Touching or steadying assistance (Pt > 75%) Chair/bed transfer assistive device: Armrests, Walker Chair/bed transfer details: Verbal cues for safe use of DME/AE, Verbal cues for precautions/safety  Function -  Locomotion: Wheelchair Will patient use wheelchair at discharge?:  No Type: Manual Max wheelchair distance: 150 Assist Level: Supervision or verbal cues, Touching or steadying assistance (Pt > 75%) Assist Level: Supervision or verbal cues, Touching or steadying assistance (Pt > 75%) Assist Level: Supervision or verbal cues, Touching or steadying assistance (Pt > 75%) Turns around,maneuvers to table,bed, and toilet,negotiates 3% grade,maneuvers on rugs and over doorsills: No Function - Locomotion: Ambulation Assistive device: Walker-rolling Max distance: 80 ft Assist level: Touching or steadying assistance (Pt > 75%) Assist level: Touching or steadying assistance (Pt > 75%) Assist level: Touching or steadying assistance (Pt > 75%) Walk 150 feet activity did not occur: Safety/medical concerns (fatigue) Assist level: Touching or steadying assistance (Pt > 75%)  Function - Comprehension Comprehension: Auditory Comprehension assist level: Understands complex 90% of the time/cues 10% of the time  Function - Expression Expression: Verbal Expression assist level: Expresses complex 90% of the time/cues < 10% of the time  Function - Social Interaction Social Interaction assist level: Interacts appropriately with others with medication or extra time (anti-anxiety, antidepressant).  Function - Problem Solving Problem solving assist level: Solves basic 90% of the time/requires cueing < 10% of the time  Function - Memory Memory assist level: Recognizes or recalls 90% of the time/requires cueing < 10% of the time Patient normally able to recall (first 3 days only): Current season, Location of own room, Staff names and faces, That he or she is in a hospital  Medical Problem List and Plan: 1.  Weakness/myelopathy, debilitation secondary to myelofibrosis. Have contacted Dr. Julien Nordmann oncology services regarding patient's admission to inpatient rehab.             -tolerating PT/OT but needing TEDs when up 2.  DVT Prophylaxis/Anticoagulation: SCDs. Monitor for any  signs of DVT. OOB/ambulation 3. Pain Management: Tylenol as needed 4. Hypertension. Norvasc 10 mg daily. 166/87, several elevated reading over th last 48h however has dizziness with standing using TEDs, check ortho BPs 5. Neuropsych: This patient is capable of making decisions on his own behalf. 6. Skin/Wound Care: Routine skin checks             -continue topical rx 7. Fluids/Electrolytes/Nutrition: Routine I&O with follow-up chemistries. Encourage PO 8. History of hip fracture with recurrent dislocation. Hip brace at all times with hip precautions.(has another month in brace) Weightbearing as tolerated. 9. GERD. Pepcid 10. BPH. Thoracic care 10 mg daily. 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 Constipation: sorbitol on admit. Suppository if needed             -daily senokot-s 13.  Urinary freq with mild dysuria- UA neg takes vesicare at home , on enablex, increase to 15mg , monitor retention  LOS (Days) 2 A FACE TO FACE EVALUATION WAS PERFORMED  Thomas Cherry 03/20/2015, 7:53 AM

## 2015-03-21 ENCOUNTER — Inpatient Hospital Stay (HOSPITAL_COMMUNITY): Payer: Medicare Other

## 2015-03-21 ENCOUNTER — Inpatient Hospital Stay (HOSPITAL_COMMUNITY): Payer: Medicare Other | Admitting: *Deleted

## 2015-03-21 ENCOUNTER — Inpatient Hospital Stay (HOSPITAL_COMMUNITY): Payer: Medicare Other | Admitting: Occupational Therapy

## 2015-03-21 LAB — COMPREHENSIVE METABOLIC PANEL
ALT: 14 U/L — AB (ref 17–63)
AST: 15 U/L (ref 15–41)
Albumin: 2.6 g/dL — ABNORMAL LOW (ref 3.5–5.0)
Alkaline Phosphatase: 84 U/L (ref 38–126)
Anion gap: 8 (ref 5–15)
BILIRUBIN TOTAL: 0.8 mg/dL (ref 0.3–1.2)
BUN: 21 mg/dL — AB (ref 6–20)
CO2: 31 mmol/L (ref 22–32)
CREATININE: 1.31 mg/dL — AB (ref 0.61–1.24)
Calcium: 8.6 mg/dL — ABNORMAL LOW (ref 8.9–10.3)
Chloride: 100 mmol/L — ABNORMAL LOW (ref 101–111)
GFR, EST NON AFRICAN AMERICAN: 54 mL/min — AB (ref >60)
Glucose, Bld: 121 mg/dL — ABNORMAL HIGH (ref 65–99)
Potassium: 3.7 mmol/L (ref 3.5–5.1)
Sodium: 139 mmol/L (ref 135–145)
TOTAL PROTEIN: 4.9 g/dL — AB (ref 6.5–8.1)

## 2015-03-21 LAB — CBC WITH DIFFERENTIAL/PLATELET
BASOS PCT: 0 %
Basophils Absolute: 0 10*3/uL (ref 0.0–0.1)
EOS PCT: 18 %
Eosinophils Absolute: 1.7 10*3/uL — ABNORMAL HIGH (ref 0.0–0.7)
HEMATOCRIT: 27.2 % — AB (ref 39.0–52.0)
Hemoglobin: 8.8 g/dL — ABNORMAL LOW (ref 13.0–17.0)
LYMPHS ABS: 2.3 10*3/uL (ref 0.7–4.0)
Lymphocytes Relative: 25 %
MCH: 32.6 pg (ref 26.0–34.0)
MCHC: 32.4 g/dL (ref 30.0–36.0)
MCV: 100.7 fL — AB (ref 78.0–100.0)
MONO ABS: 1.1 10*3/uL — AB (ref 0.1–1.0)
MONOS PCT: 12 %
NEUTROS ABS: 4.2 10*3/uL (ref 1.7–7.7)
Neutrophils Relative %: 45 %
Platelets: 97 10*3/uL — ABNORMAL LOW (ref 150–400)
RBC: 2.7 MIL/uL — AB (ref 4.22–5.81)
RDW: 19.9 % — AB (ref 11.5–15.5)
WBC: 9.3 10*3/uL (ref 4.0–10.5)

## 2015-03-21 MED ORDER — TACROLIMUS 0.5 MG PO CAPS
0.5000 mg | ORAL_CAPSULE | Freq: Two times a day (BID) | ORAL | Status: DC
  Administered 2015-03-26: 0.5 mg via ORAL
  Filled 2015-03-21 (×3): qty 1

## 2015-03-21 NOTE — Progress Notes (Signed)
Occupational Therapy Session Note  Patient Details  Name: Thomas Cherry MRN: [id number] Date of Birth: 12-09-1945  Today's Date: 03/21/2015 OT Individual Time: 0700-0800 OT Individual Time Calculation (min): 60 min    Short Term Goals: Week 1:  OT Short Term Goal 1 (Week 1): STGs = LTGs due to short LOS  Skilled Therapeutic Interventions/Progress Updates:    Pt resting in bed upon arrival and agreeable to therapy.  Pt declined shower this morning stating that "it was too early" for a shower.  Pt stated he would be open to shower during 11 am OT session.  Pt amb into bathroom to gather bath wash and towel before sitting at sink to wash up before changing into clean clothing.  Pt amb with RW in room to gather clothing.  Pt required min verbal cues for RW safety.  Pt often would place RW to side and walk 3 or 4 steps to retrieve an item.  Pt required min verbal cues to adhere to hip precautions.  Pt utilized reacher to assist with LB dressing tasks.  Pt also required assistance with donning brace on LLE.  Pt amb to therapy gym and practiced bed mobility with mat raised to approx height of bed at home.  Pt also practiced stepping over into shower to simulate home environment.  Pt fatigues quickly with activity and requires rest breaks.  Focus on activity tolerance, functional amb with RW, standing balance, discharge planning, and safety awareness to increase independence with BADLs.  Therapy Documentation Precautions:  Precautions Precautions: Posterior Hip, Fall Required Braces or Orthoses: Other Brace/Splint Other Brace/Splint: LLE brace at all times Restrictions Weight Bearing Restrictions: No LLE Weight Bearing: Weight bearing as tolerated Other Position/Activity Restrictions: LLE brace at all times to prevent hip dislocation Pain:  Pt denied pain  See Function Navigator for Current Functional Status.   Therapy/Group: Individual Therapy  Leroy Libman 03/21/2015, 8:06  AM

## 2015-03-21 NOTE — Care Management Note (Signed)
Inpatient Rehabilitation Center Individual Statement of Services  Patient Name:  Thomas Cherry  Date:  03/21/2015  Welcome to the [facility].  Our goal is to provide you with an individualized program based on your diagnosis and situation, designed to meet your specific needs.  With this comprehensive rehabilitation program, you will be expected to participate in at least 3 hours of rehabilitation therapies Monday-Friday, with modified therapy programming on the weekends.  Your rehabilitation program will include the following services:  Physical Therapy (PT), Occupational Therapy (OT), 24 hour per day rehabilitation nursing, Therapeutic Recreaction (TR), Neuropsychology, Case Management (Social Worker), Rehabilitation Medicine, Nutrition Services and Pharmacy Services  Weekly team conferences will be held on Tuesdays to discuss your progress.  Your Social Worker will talk with you frequently to get your input and to update you on team discussions.  Team conferences with you and your family in attendance may also be held.  Expected length of stay: 7-9 days  Overall anticipated outcome: supervision  Depending on your progress and recovery, your program may change. Your Social Worker will coordinate services and will keep you informed of any changes. Your Social Worker's name and contact numbers are listed  below.  The following services may also be recommended but are not provided by the [place] will be made to provide these services after discharge if needed.  Arrangements include referral to agencies that provide these services.  Your insurance has been verified to be:  Medicare and [place] Your primary doctor is:  Dr. Loanne Drilling  Pertinent information will be shared with your doctor and your insurance  company.  Social Worker:  [place], [place] or ([phone]   Information discussed with and copy given to patient by: Lennart Pall, 03/21/2015, 2:44 PM

## 2015-03-21 NOTE — Progress Notes (Signed)
Occupational Therapy Note  Patient Details  Name: AZZAN NAVAS MRN: [id number] Date of Birth: 1945/05/03  Today's Date: 03/21/2015 OT Individual Time: 1300-1330 OT Individual Time Calculation (min): 30 min   Pt denied pain Individual Therapy  Pt resting in w/c upon arrival.  Pt amb with RW to therapy gym and engaged in functional amb activities and dynamic standing activities to increase standing balance and endurance.  Pt reeducated on RW safety with emphasizing on placement of RW when preparing to sit.  Also emphasized importance of not walking without use of RW.  Pt amb from therapy gym to day room and engaged in continuing therapeutic tasks before walking back to room.  Pt requested to return to bed but agreeable to remaining in w/c until next therapy session.    Leotis Shames [facility] 03/21/2015, 3:04 PM

## 2015-03-21 NOTE — Progress Notes (Signed)
Subjective/Complaints: Pt c/o freq urination still. Orthostatic.    Has mild dysuria as well still  ROS: Pt denies fever, rash/itching, headache, blurred or double vision, nausea, vomiting, abdominal pain, diarrhea, chest pain, shortness of breath, palpitations, dysuria, dizziness, neck or back pain, bleeding, anxiety, or depression   Objective: Vital Signs: Blood pressure 153/70, pulse 95, temperature 98 F (36.7 C), temperature source Oral, resp. rate 18, weight 64.864 kg (143 lb), SpO2 97 %. No results found. Results for orders placed or performed during the hospital encounter of 03/18/15 (from the past 72 hour(s))  Urinalysis, Routine w reflex microscopic (not at [facility])     Status: Abnormal   Collection Time: 03/19/15  3:31 PM  Result Value Ref Range   Color, Urine YELLOW YELLOW   APPearance CLEAR CLEAR   Specific Gravity, Urine 1.016 1.005 - 1.030   pH 7.0 5.0 - 8.0   Glucose, UA 100 (A) NEGATIVE mg/dL   Hgb urine dipstick NEGATIVE NEGATIVE   Bilirubin Urine NEGATIVE NEGATIVE   Ketones, ur NEGATIVE NEGATIVE mg/dL   Protein, ur 30 (A) NEGATIVE mg/dL   Nitrite NEGATIVE NEGATIVE   Leukocytes, UA NEGATIVE NEGATIVE  Urine microscopic-add on     Status: None   Collection Time: 03/19/15  3:31 PM  Result Value Ref Range   Squamous Epithelial / LPF NONE SEEN NONE SEEN   WBC, UA 0-5 0 - 5 WBC/hpf   RBC / HPF NONE SEEN 0 - 5 RBC/hpf   Bacteria, UA NONE SEEN NONE SEEN  CBC WITH DIFFERENTIAL     Status: Abnormal   Collection Time: 03/21/15  5:42 AM  Result Value Ref Range   WBC 9.3 4.0 - 10.5 K/uL   RBC 2.70 (L) 4.22 - 5.81 MIL/uL   Hemoglobin 8.8 (L) 13.0 - 17.0 g/dL   HCT 27.2 (L) 39.0 - 52.0 %   MCV 100.7 (H) 78.0 - 100.0 fL   MCH 32.6 26.0 - 34.0 pg   MCHC 32.4 30.0 - 36.0 g/dL   RDW 19.9 (H) 11.5 - 15.5 %   Platelets 97 (L) 150 - 400 K/uL    Comment: PLATELET COUNT CONFIRMED BY SMEAR   Neutrophils Relative % 45 %   Lymphocytes Relative 25 %   Monocytes Relative 12 %   Eosinophils Relative 18 %   Basophils Relative 0 %   Neutro Abs 4.2 1.7 - 7.7 K/uL   Lymphs Abs 2.3 0.7 - 4.0 K/uL   Monocytes Absolute 1.1 (H) 0.1 - 1.0 K/uL   Eosinophils Absolute 1.7 (H) 0.0 - 0.7 K/uL   Basophils Absolute 0.0 0.0 - 0.1 K/uL   RBC Morphology ELLIPTOCYTES     Comment: SCHISTOCYTES PRESENT (2-5/hpf) POLYCHROMASIA PRESENT    WBC Morphology ATYPICAL LYMPHOCYTES   Comprehensive metabolic panel     Status: Abnormal   Collection Time: 03/21/15  5:42 AM  Result Value Ref Range   Sodium 139 135 - 145 mmol/L   Potassium 3.7 3.5 - 5.1 mmol/L   Chloride 100 (L) 101 - 111 mmol/L   CO2 31 22 - 32 mmol/L   Glucose, Bld 121 (H) 65 - 99 mg/dL   BUN 21 (H) 6 - 20 mg/dL   Creatinine, Ser 1.31 (H) 0.61 - 1.24 mg/dL   Calcium 8.6 (L) 8.9 - 10.3 mg/dL   Total Protein 4.9 (L) 6.5 - 8.1 g/dL   Albumin 2.6 (L) 3.5 - 5.0 g/dL   AST 15 15 - 41 U/L   ALT 14 (L) 17 - 63  U/L   Alkaline Phosphatase 84 38 - 126 U/L   Total Bilirubin 0.8 0.3 - 1.2 mg/dL   GFR calc non Af Amer 54 (L) >60 mL/min   GFR calc Af Amer >60 >60 mL/min    Comment: (NOTE) The eGFR has been calculated using the CKD EPI equation. This calculation has not been validated in all clinical situations. eGFR's persistently <60 mL/min signify possible Chronic Kidney Disease.    Anion gap 8 5 - 15        Assessment/Plan: 1. 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 Physiatrist is providing close team supervision and 24 hour management of active medical problems listed below. Physiatrist and rehab team continue to assess barriers to discharge/monitor patient progress toward functional and medical goals. FIM: Function - Bathing Position: Shower Body parts bathed by patient: Right arm, Left arm, Chest, Abdomen, Front perineal area,  Buttocks, Right upper leg, Left upper leg, Right lower leg, Left lower leg Assist Level: Assistive device, Touching or steadying assistance(Pt > 75%) Assistive Device Comment: long handled sponge  Function- Upper Body Dressing/Undressing What is the patient wearing?: Pull over shirt/dress Pull over shirt/dress - Perfomed by patient: Thread/unthread right sleeve, Thread/unthread left sleeve, Put head through opening, Pull shirt over trunk Assist Level: Supervision or verbal cues Function - Lower Body Dressing/Undressing What is the patient wearing?: Underwear, Pants, Shoes, Ted Hose Position: Wheelchair/chair at [employer] - Performed by patient: Thread/unthread right underwear leg, Thread/unthread left underwear leg, Pull underwear up/down Pants- Performed by patient: Pull pants up/down, Thread/unthread right pants leg, Thread/unthread left pants leg Non-skid slipper socks- Performed by helper: Don/doff right sock, Don/doff left sock Shoes - Performed by patient: Don/doff right shoe, Don/doff left shoe, Fasten right, Fasten left TED Hose - Performed by helper: Don/doff right TED hose, Don/doff left TED hose Assist for lower body dressing: Assistive device, Touching or steadying assistance (Pt > 75%) Assistive Device Comment: reacher  Function - Toileting Toileting steps completed by patient: Performs perineal hygiene, Adjust clothing prior to toileting, Adjust clothing after toileting Toileting Assistive Devices: Grab bar or rail Assist level: Touching or steadying assistance (Pt.75%)  Function - Archivist transfer assistive device: Grab bar, Walker Assist level to toilet: Touching or steadying assistance (Pt > 75%) Assist level from toilet: Touching or steadying assistance (Pt > 75%)  Function - Chair/bed transfer Chair/bed transfer method: Stand pivot, Ambulatory Chair/bed transfer assist level: Touching or steadying assistance (Pt > 75%) Chair/bed transfer  assistive device: Armrests Chair/bed transfer details: Verbal cues for precautions/safety  Function - Locomotion: Wheelchair Will patient use wheelchair at discharge?: No Type: Manual Max wheelchair distance: 175 Assist Level: Supervision or verbal cues, Touching or steadying assistance (Pt > 75%) Assist Level: Supervision or verbal cues, Touching or steadying assistance (Pt > 75%) Assist Level: Supervision or verbal cues, Touching or steadying assistance (Pt > 75%) Turns around,maneuvers to table,bed, and toilet,negotiates 3% grade,maneuvers on rugs and over doorsills: No Function - Locomotion: Ambulation Assistive device: Walker-rolling Max distance: 90 Assist level: Touching or steadying assistance (Pt > 75%) Assist level: Touching or steadying assistance (Pt > 75%) Assist level: Touching or steadying assistance (Pt > 75%) Walk 150 feet activity did not occur: Safety/medical concerns (fatigue) Assist level: Touching or steadying assistance (Pt > 75%)  Function - Comprehension Comprehension: Auditory Comprehension assist level: Understands complex 90% of the time/cues  10% of the time  Function - Expression Expression: Verbal Expression assist level: Expresses complex 90% of the time/cues < 10% of the time  Function - Social Interaction Social Interaction assist level: Interacts appropriately with others - No medications needed.  Function - Problem Solving Problem solving assist level: Solves basic 90% of the time/requires cueing < 10% of the time  Function - Memory Memory assist level: Recognizes or recalls 90% of the time/requires cueing < 10% of the time Patient normally able to recall (first 3 days only): Current season, Location of own room, Staff names and faces, That he or she is in a hospital  Medical Problem List and Plan: 1.  Weakness/myelopathy, debilitation secondary to myelofibrosis. Have contacted Dr. Julien Nordmann oncology services regarding patient's admission to  inpatient rehab.             -continue cir 2.  DVT Prophylaxis/Anticoagulation: SCDs. Monitor for any signs of DVT. OOB/ambulation 3. Pain Management: Tylenol as needed 4. Hypertension. Norvasc 10 mg daily. Now exhibiting orthostasis  -TEDS/transitional measures when going from sit to stand 5. Neuropsych: This patient is capable of making decisions on his own behalf. 6. Skin/Wound Care: Routine skin checks             -continue topical rx  -skin still somewhat dry 7. Fluids/Electrolytes/Nutrition: Routine I&O with follow-up chemistries. Encourage PO 8. History of hip fracture with recurrent dislocation. Hip brace at all times with hip precautions.(has another month in brace) Weightbearing as tolerated. 9. GERD. 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 Constipation: sorbitol on admit. Suppository if needed             -daily senokot-s 13.  BPH/Urinary freq with mild dysuria- UA neg, no culture collected===check today  - takes vesicare at home , on enablex here which was increased to 74m  -holding flomax due to orthostasis  -need regular monitoring of pvr's  LOS (Days) 3 A FACE TO FACE EVALUATION WAS PERFORMED  SWARTZ,ZACHARY T 03/21/2015, 8:39 AM

## 2015-03-21 NOTE — Progress Notes (Signed)
Occupational Therapy Session Note  Patient Details  Name: JANARI YAMADA MRN: [id number] Date of Birth: 12/23/1945  Today's Date: 03/21/2015 OT Individual Time: 1100-1200 OT Individual Time Calculation (min): 60 min    Short Term Goals: Week 1:  OT Short Term Goal 1 (Week 1): STGs = LTGs due to short LOS  Skilled Therapeutic Interventions/Progress Updates:    Pt seen for skilled OT to facilitate functional mobility and activity tolerance to improve I with self care. Pt received in bed, declined shower as he had sponge bathed in earlier OT session.  Pt agreeable to there ex. Pt worked on slow and gradual sit to stands to avoid dizziness. He continued to need cues throughout the session to stand slowly, breathe and pause before proceeding to walk. He also needed frequent cues to push up from the bed or the mat versus reaching for walker first. Pt ambulated to gym with RW, worked on BUE exercises using 2# dowel bar, and standing balance exercises. Pt needed frequent but short (1 min) rest breaks throughout the session.  On way back to room, he stopped at water fountain to get drink of water. He bent down slightly then stood quickly, turned around suddenly and said he felt very dizzy. W/c behind pt and he "flopped" down into it. Reviewed again the need to slow his pace down. Pt rested and continued his walk back to the room. Stood to wash hands at the sink. Resting in w/c to prepare for lunch.  All needs met, call light in reach.  Therapy Documentation Precautions:  Precautions Precautions: Posterior Hip, Fall Required Braces or Orthoses: Other Brace/Splint Other Brace/Splint: LLE brace at all times Restrictions Weight Bearing Restrictions: No LLE Weight Bearing: Weight bearing as tolerated Other Position/Activity Restrictions: LLE brace at all times to prevent hip dislocation  Pain: Pain Assessment Pain Assessment: No/denies pain ADL: ADL ADL Comments: refer to functional navigator  See  Function Navigator for Current Functional Status.   Therapy/Group: Individual Therapy  [place] 03/21/2015, 12:22 PM

## 2015-03-21 NOTE — Progress Notes (Signed)
Pam Love, PA notified of PVR volume and received orders to I&O cath for volumes greater than 350. She wants [place] to attempt to use a regular cath but if we have trouble to use a coude cath with lidocaine gel. She wants [place] to hold enablex, and PVR after every void. Kennieth Francois, RN

## 2015-03-21 NOTE — Progress Notes (Signed)
Social Work Social Work Assessment and Plan  Patient Details  Name: Thomas Cherry MRN: [id number] Date of Birth: 11-17-1945  Today's Date: 03/21/2015  Problem List:  Patient Active Problem List   Diagnosis Date Noted  . Debility 03/18/2015  . 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 Skin tear of elbow without complication [id number]  . Hip dislocation, left ([place]) 01/23/2015  . Myelofibrosis ([place]) 06/17/2013  . Nonspecific abnormal electrocardiogram (ECG) (EKG) 03/26/2013  . Seborrhea 03/24/2012  . Palpitation   . Essential hypertension   . 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 Essential thrombocythemia ([place]) 12/28/2010  . Rash 11/24/2010  . Toe pain, left 11/16/2010  . Thrombocytosis ([place]) 11/16/2010  . Personal history of colonic polyps 06/14/2010  . Screening for prostate cancer 06/02/2010  . Pure hypercholesterolemia 06/02/2010  . Inguinal hernia 06/02/2010  . Encounter for long-term (current) use of other medications 06/02/2010  . CERUMEN IMPACTION, RIGHT 03/26/2008  . GERD 10/11/2006   Past Medical History:  Past Medical History  Diagnosis Date  . GERD (gastroesophageal reflux disease)   . Allergy     heyfever  . Hypertension   . Palpitation   . Elevated platelet count (HCC)     on hydroxyurea  . Heart murmur    Past Surgical History:  Past Surgical History  Procedure Laterality Date  . Hernia repair    . Total hip arthroplasty     Social History:  reports that he has never smoked. He has never used smokeless tobacco. He reports that he does not drink alcohol or use illicit drugs.  Family / Support Systems Marital Status: Married Patient Roles: Spouse, Parent, Other (Comment) (business owner) Spouse/Significant Other: wife, Kier Veasley @ (H) [phone] or ([phone] Children: adult daughters, Dian Situ and Burman Nieves both living locally and work f/t Anticipated Caregiver: wife, and daughter, Dian Situ and wife's  sister Ability/Limitations of Caregiver: Pt notes that his daughter, Dian Situ, can "work from home"  Caregiver Availability: 24/7 Family Dynamics: Pt describes family as extremely supportive.  Social History Preferred language: English Religion:  Cultural Background: NA Education: college Read: Yes Write: Yes Employment Status: Employed Return to Work Plans: Pt plans to return to work as his health will allow/ when medically cleared to do so. Legal Hisotry/Current Legal Issues: None Guardian/Conservator: None - per MD, pt is capable of making decisions on his own behalf   Abuse/Neglect Physical Abuse: Denies Verbal Abuse: Denies Sexual Abuse: Denies Exploitation of patient/patient's resources: Denies Self-Neglect: Denies  Emotional Status Pt's affect, behavior adn adjustment status: Pt very pleasant and able to complete the assessment interview without difficulty.  He admits to much fatigue but happy to have started CIR program.  He denies any s/s of any emotional distress.  No s/s of depression - will monitor and refer for formal screening with neuropsychology if indicated. Recent Psychosocial Issues: Hip surgery Nov 2016 which has continued to affect his mobility.   Pyschiatric History: None Substance Abuse History: None  Patient / Family Perceptions, Expectations & Goals Pt/Family understanding of illness & functional limitations: Pt with very good understanding of his medical issues and course.  Good understanding of his current functional limitations/ need for CIR. Premorbid pt/family roles/activities: Prior to Nov, pt was completely independent and working full time. Anticipated changes in roles/activities/participation: Pt will require supervision at least upon d/c.  Family/ wife have been providing assistance really since Nov and will continue. Pt/family expectations/goals: "I need to be alot stronger."  [place]  Agencies: None Premorbid Home Care/DME  Agencies: Other (Comment) [place] Go for [facility] PTA) Transportation available at discharge: yes Resource referrals recommended: Neuropsychology  Discharge Planning Living Arrangements: Spouse/significant other Support Systems: Spouse/significant other, Children, Other relatives, Friends/neighbors Type of Residence: Private residence Insurance Resources: [employer], Multimedia programmer (specify) Nurse, mental health) Financial Resources: Fish farm manager, Employment Financial Screen Referred: No Living Expenses: Own Money Management: Patient Does the patient have any problems obtaining your medications?: No Home Management: shared Patient/Family Preliminary Plans: Pt to return home with wife and other family members providing 24/7 supervision/ assist Social Work Anticipated Follow Up Needs: HH/OP Expected length of stay: 7-9 days  Clinical Impression Very pleasant gentleman here with significant debility/ deconditioning and transferring from [facility].  Motivated for CIR and with good family support.  He denies any significant concerns and is focused on "getting this dizziness under control."  Anticipating fairly short LOS with supervision goals.  Will follow for support and d/c planning needs.  Alaiya Martindelcampo 03/21/2015, 3:55 PM

## 2015-03-21 NOTE — Progress Notes (Signed)
Physical Therapy Session Note  Patient Details  Name: Thomas Cherry MRN: [id number] Date of Birth: [birth year]  Today's Date: 03/21/2015 PT Individual Time: 1405-1505 PT Individual Time Calculation (min): 60 min   Short Term Goals: Week 1:  PT Short Term Goal 1 (Week 1): = LTGs due to anticipated LOS  Skilled Therapeutic Interventions/Progress Updates:  Tx focused on functional mobility training, gait with RW in controlled and community settings, therex for activity tolerance and strengthening, and NMR via functional balance training and fall risk reduction with multi-modal cues.  Pt feeling well and on track with his goals towards DC. Throughout tx, discussed fall risk reduction at  Length including tips on wide BOS, slow turns, and safe RW management.   Sit<>stand with Min-guard A from WC and varying surfaces, as well as up to Min A during stand step/pivot transfers due to narrow BOS. Safety cues provided  Gait x 2x200 and 2x500' in community setting with RW and min-guard A over varying and uneven surfaces and inclines. Pt needed cues for heel strike, BOS, posture and reducing speed for safety, especially during turns, obstacles, and busy environments. Pt had no LOB, but needed 2 seated rest breaks. Discussed typical community access and trouble shooting possible difficulties, including his desire for grocery shopping. Performed transfers from varying surfaces and furniture within hip precautions.  Instructed pt in [place] HEP for fall risk reduction, within hip precautions x10 each bil with cues and demo for technique and safety.   Pt left in bed to rest with bed alarm on and all needs in reach.      Therapy Documentation Precautions:  Precautions Precautions: Posterior Hip, Fall Required Braces or Orthoses: Other Brace/Splint Other Brace/Splint: LLE brace at all times Restrictions Weight Bearing Restrictions: No LLE Weight Bearing: Weight bearing as tolerated Other  Position/Activity Restrictions: LLE brace at all times to prevent hip dislocation   Pain: Pain Assessment Pain Assessment: No/denies pain   See Function Navigator for Current Functional Status.   Therapy/Group: Individual Therapy  Kathan Kirker, Corinna Lines, PT, DPT  03/21/2015, 1:30 PM

## 2015-03-21 NOTE — IPOC Note (Addendum)
Overall Plan of Care [facility]) Patient Details Name: Thomas Cherry MRN: [id number] DOB: 1945/06/12  Admitting Diagnosis: Bone marrow transplant,debility, sepsis  Hospital Problems: Active Problems:   Myelofibrosis ([place])   Hip dislocation, left ([place])   Debility   Peripheral neuropathy, secondary to drugs or chemicals     Functional Problem List: Nursing Bladder, Bowel, Skin Integrity, Sensory  PT Balance, Behavior, Endurance, Motor, Nutrition, Pain, Safety  OT Balance, Cognition, Endurance, Motor, Safety, Sensory, Skin Integrity  SLP    TR         Basic ADL's: OT Grooming, Bathing, Dressing, Toileting     Advanced  ADL's: OT       Transfers: PT Bed Mobility, Bed to Chair, Car, Manufacturing systems engineer, Metallurgist: PT Ambulation, Emergency planning/management officer, Stairs     Additional Impairments: OT None  SLP        TR      Anticipated Outcomes Item Anticipated Outcome  Self Feeding I  Swallowing      Basic self-care  supervision  Toileting  supervision   Bathroom Transfers supervision  Bowel/Bladder  Mod I  Transfers  mod I  Locomotion  supervision  Communication     Cognition     Pain  <3  Safety/Judgment  Mod I   Therapy Plan: PT Intensity: Minimum of 1-2 x/day ,45 to 90 minutes PT Frequency: 5 out of 7 days PT Duration Estimated Length of Stay: 7-9 days OT Intensity: Minimum of 1-2 x/day, 45 to 90 minutes OT Frequency: 5 out of 7 days OT Duration/Estimated Length of Stay: 7-9 days         Team Interventions: Nursing Interventions Patient/Family Education, Bowel Management, Disease Management/Prevention, Skin Care/Wound Management  PT interventions Ambulation/gait training, Training and development officer, Community reintegration, Discharge planning, Disease management/prevention, DME/adaptive equipment instruction, Functional mobility training, Neuromuscular re-education, Pain management, Patient/family education, Psychosocial support, Stair  training, Therapeutic Activities, Therapeutic Exercise, UE/LE Coordination activities, UE/LE Strength taining/ROM, Wheelchair propulsion/positioning  OT Interventions Training and development officer, Discharge planning, Cognitive remediation/compensation, DME/adaptive equipment instruction, Functional mobility training, Patient/family education, Self Care/advanced ADL retraining, Therapeutic Activities, Therapeutic Exercise, UE/LE Strength taining/ROM, UE/LE Coordination activities  SLP Interventions    TR Interventions    SW/CM Interventions Discharge Planning, Psychosocial Support, Patient/Family Education    Team Discharge Planning: Destination: PT-Home ,OT- Home , SLP-  Projected Follow-up: PT-Home health PT, OT-  Home health OT, SLP-  Projected Equipment Needs: PT-None recommended by PT, OT- None recommended by OT, SLP-  Equipment Details: PT-patient owns RW, OT-  Patient/family involved in discharge planning: PT- Patient,  OT-Patient, SLP-   MD ELOS: 8-10d Medical Rehab Prognosis:  Good Assessment: 70 year old right-handed male with history of hypertension, recent hip fracture November 2016 secondary to osteoporosis complicated by dislocation with brace applied and posterior hip precautions, severe polyneuropathy felt to be secondary dapsone and tacrolimus that were adjusted and recent EMG completed, myelofibrosis status post bone marrow transplant 2015 followed by Dr. Virgina Norfolk [phone] oncology services at [facility] Patient lives with his wife in [place]. Independent with rolling walker since hip fracture in November 2016. Wife and daughter can assist. Presented 03/14/2015 to [facility] with acute weakness and inability to walk or stand. He recently completed course of antibiotics for pneumonia. Noted recent fall sustaining abrasion to right forearm. Admission labs unremarkable except mild hypokalemia 3.4.   Now requiring 24/7 Rehab RN,MD, as well as CIR level  PT, OT .  Treatment team will focus on  ADLs and mobility with goals set at Supervision   See Team Conference Notes for weekly updates to the plan of care

## 2015-03-21 NOTE — Progress Notes (Signed)
Patient information reviewed and entered into eRehab system by Mykale Gandolfo, RN, CRRN, PPS Coordinator.  Information including medical coding and functional independence measure will be reviewed and updated through discharge.     Per nursing patient was given "Data Collection Information Summary for Patients in Inpatient Rehabilitation Facilities with attached "Privacy Act Statement-Health Care Records" upon admission.  

## 2015-03-22 ENCOUNTER — Inpatient Hospital Stay (HOSPITAL_COMMUNITY): Payer: Medicare Other | Admitting: Physical Therapy

## 2015-03-22 ENCOUNTER — Inpatient Hospital Stay (HOSPITAL_COMMUNITY): Payer: Medicare Other

## 2015-03-22 ENCOUNTER — Inpatient Hospital Stay (HOSPITAL_COMMUNITY): Payer: Medicare Other | Admitting: Occupational Therapy

## 2015-03-22 MED ORDER — AMLODIPINE BESYLATE 5 MG PO TABS
5.0000 mg | ORAL_TABLET | Freq: Every day | ORAL | Status: DC
  Administered 2015-03-23 – 2015-03-24 (×2): 5 mg via ORAL
  Filled 2015-03-22 (×2): qty 1

## 2015-03-22 MED ORDER — CARBAMIDE PEROXIDE 6.5 % OT SOLN
5.0000 [drp] | Freq: Two times a day (BID) | OTIC | Status: AC
  Administered 2015-03-22 – 2015-03-24 (×6): 5 [drp] via OTIC
  Filled 2015-03-22: qty 15

## 2015-03-22 MED ORDER — FLUDROCORTISONE ACETATE 0.1 MG PO TABS
0.1000 mg | ORAL_TABLET | Freq: Every day | ORAL | Status: DC
  Administered 2015-03-23 – 2015-03-26 (×4): 0.1 mg via ORAL
  Filled 2015-03-22 (×5): qty 1

## 2015-03-22 MED ORDER — DARIFENACIN HYDROBROMIDE ER 7.5 MG PO TB24
7.5000 mg | ORAL_TABLET | Freq: Every day | ORAL | Status: DC
  Administered 2015-03-23 – 2015-03-26 (×4): 7.5 mg via ORAL
  Filled 2015-03-22 (×5): qty 1

## 2015-03-22 NOTE — Patient Care Conference (Signed)
Inpatient RehabilitationTeam Conference and Plan of Care Update Date: 03/22/2015   Time: 2:35 PM    Patient Name: Thomas Cherry      Medical Record Number: [id number]  Date of Birth: [birth year] Sex: Male         Room/Bed: 4M06C/4M06C-01 Payor Info: Payor: MEDICARE / Plan: MEDICARE PART A AND B / Product Type: *No Product type* /    Admitting Diagnosis: Bone marrow transplant,debility, sepsis  Admit Date/Time:  03/18/2015  2:58 PM Admission Comments: No comment available   Primary Diagnosis:  <principal problem not specified> Principal Problem: <principal problem not specified>  Patient Active Problem List   Diagnosis Date Noted  . Debility 03/18/2015  . 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 Skin tear of elbow without complication [id number]  . Hip dislocation, left ([place]) 01/23/2015  . 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 Nonspecific abnormal electrocardiogram (ECG) (EKG) 03/26/2013  . Seborrhea 03/24/2012  . Palpitation   . Essential hypertension   . 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 Essential thrombocythemia ([place]) 12/28/2010  . Rash 11/24/2010  . Toe pain, left 11/16/2010  . Thrombocytosis ([place]) 11/16/2010  . Personal history of colonic polyps 06/14/2010  . Screening for prostate cancer 06/02/2010  . Pure hypercholesterolemia 06/02/2010  . Inguinal hernia 06/02/2010  . Encounter for long-term (current) use of other medications 06/02/2010  . CERUMEN IMPACTION, RIGHT 03/26/2008  . GERD 10/11/2006    Expected Discharge Date: Expected Discharge Date: 03/26/15 (if medically stable)  Team Members Present: Physician leading conference: Dr. Alger Simons Social Worker Present: Lennart Pall, LCSW Nurse Present: Heather Roberts, RN PT Present: Carney Living, PT OT Present: Roanna Epley, [place], OT SLP Present: Weston Anna, SLP PPS Coordinator present : Daiva Nakayama, RN, CRRN     Current Status/Progress Goal Weekly Team Focus   Medical   debility, neuropathy d/t myelofibrosis, recent left hip dislocation  improve balance, stamina  bp control, bladder emptying   Bowel/Bladder   Continent bowel and bladder; frequent small voids, PVRs 130s-140s, double voids, stand to void.  No retention, no s/s infection.  Continue PVRs, assist to stand for voids.   Swallow/Nutrition/ Hydration             ADL's   occasional steadying A required during self care for LB, generalized weakness that limits standing tolerance, decreased safety awareness with use of RW and standing techniques  supervision overall with BADLs  ADL retraining, functional mobiity and activity tolerance training, balance, pt/family education   Mobility   min A overall, decreased safety awareness, orthostatic  supervision overall  functional mobility, strengthening, activity tolerance, safety awareness, standing balance, pt/family education   Communication             Safety/Cognition/ Behavioral Observations  No unsafe behaviors, calls for assist. Orthostatic.  No falls, injury this admission; orthostasis managed.  Monitor, encourage OOB.   Pain   Denies any pain, discomfort.  MAintain current status  Monitor, observe for non-verbal S/S pain.   Skin   Skin tears right arm with foam; ecchymosis BUEs; heels boggy, floated; skin dry, flaky; using cream from home.  Areas resolving by discharge.  Monitor, continue plan of care.    Rehab Goals Patient on target to meet rehab goals: Yes *See Care Plan and progress notes for long and short-term goals.  Barriers to Discharge: multiple medical issues, ongoing need for pharm mgt of med conditions, orthostasis    Possible Resolutions to Barriers:  continued acclimation, med adjustment  Discharge Planning/Teaching Needs:  Plan to d/c home with wife and daughter able to provide 24/7 assistance.  To be scheduled.   Team Discussion:  Very deconditioned and continues with BP issues (worse today?)  Pt pushes  to keep going even when significant BP symptoms occur - no a good judge of when he needs to stop activity.  Making changes to meds and adding abd binder.  Supervision goals - continues to require cues to adhere to hip precautions.  Revisions to Treatment Plan:  None   Continued Need for Acute Rehabilitation Level of Care: The patient requires daily medical management by a physician with specialized training in physical medicine and rehabilitation for the following conditions: Daily direction of a multidisciplinary physical rehabilitation program to ensure safe treatment while eliciting the highest outcome that is of practical value to the patient.: Yes Daily medical management of patient stability for increased activity during participation in an intensive rehabilitation regime.: Yes Daily analysis of laboratory values and/or radiology reports with any subsequent need for medication adjustment of medical intervention for : Neurological problems;Post surgical problems;Wound care problems;Blood pressure problems  Nylene Inlow 03/22/2015, 4:12 PM

## 2015-03-22 NOTE — Progress Notes (Signed)
Physical Therapy Session Note  Patient Details  Name: Thomas Cherry MRN: [id number] Date of Birth: [birth year]  Today's Date: 03/22/2015 PT Individual Time: 1305-1430 PT Individual Time Calculation (min): 85 min   Short Term Goals: Week 1:  PT Short Term Goal 1 (Week 1): = LTGs due to anticipated LOS  Skilled Therapeutic Interventions/Progress Updates:   Patient received in recliner and performed stand pivot transfers without AD with min guard. Patient propelled wheelchair using BUE 3 x 150 ft with supervision and increased time with verbal/visual cues for efficient technique. Assessed vitals: seated BP 108/62 and standing BP 73/46. Completed supine BLE therex: ankle pumps x 40, SLR x 20 each LE, heel slides x 20 each LE, hip abduction x 20 each LE, glute sets with 3-5 sec hold x 20.  Retrieved abdominal binder from room and donned in standing. Performed seated beach ball toss and catch x 3 trials to fatigue and shooting basketball from wheelchair level to work on core strengthening, dynamic balance, and activity tolerance with patient instructed in counting out loud to facilitate breathing. Reassessed seated BP 110/58, standing BP 76/48. Patient performed seated edge of mat UB therex using 4# dowel rod: elbow flexion, row with emphasis on scapular retraction, diagonals to R and L, and overhead elbow extension x 20 each exercise. Patient reporting need for bathroom, returned to room in wheelchair and performed toileting tasks with min guard/A and assist for managing LLE brace. Patient left sitting in recliner with all needs within reach, RN notified of continued orthostatic hypotension in standing.   Therapy Documentation Precautions:  Precautions Precautions: Posterior Hip, Fall Required Braces or Orthoses: Other Brace/Splint Other Brace/Splint: LLE brace at all times Restrictions Weight Bearing Restrictions: No LLE Weight Bearing: Weight bearing as tolerated Other Position/Activity  Restrictions: LLE brace at all times to prevent hip dislocation Vital Signs: Therapy Vitals BP: (!) 76/48 mmHg Patient Position (if appropriate): Standing Pain: Pain Assessment Pain Assessment: No/denies pain   See Function Navigator for Current Functional Status.   Therapy/Group: Individual Therapy  Laretta Alstrom 03/22/2015, 2:53 PM

## 2015-03-22 NOTE — Progress Notes (Signed)
Occupational Therapy Session Note  Patient Details  Name: XAN JASSO MRN: [id number] Date of Birth: 09-03-1945  Today's Date: 03/22/2015 OT Individual Time: 0830-0930 OT Individual Time Calculation (min): 60 min    Short Term Goals: Week 1:  OT Short Term Goal 1 (Week 1): STGs = LTGs due to short LOS  Skilled Therapeutic Interventions/Progress Updates:    Pt resting in bed upon arrival and agreeable to therapy.  Pt engaged in BADL retraining including bathing at shower level and dressing with sit<>stand from EOB.  Pt requested to use toilet X 3 (BM and void).  Pt continues to required min verbal cues for RW management when navigating in tight spaces and/or cluttered environment. Pt experienced lightheadedness when standing to perform toilet hygiene, adjust brace, and bathe buttocks; unable to obtain BP while standing.  Pt states that he was experiencing lightheadedness prior to admission which is the reason he was falling. Focus on activity tolerance, RW safety, overall safety awareness, sit<>stand, standing balance, functional amb with RW, and discharge planning.    Therapy Documentation Precautions:  Precautions Precautions: Posterior Hip, Fall Required Braces or Orthoses: Other Brace/Splint Other Brace/Splint: LLE brace at all times Restrictions Weight Bearing Restrictions: No LLE Weight Bearing: Weight bearing as tolerated Other Position/Activity Restrictions: LLE brace at all times to prevent hip dislocation  Pain:  Pt denied pain ADL: ADL ADL Comments: refer to functional navigator  See Function Navigator for Current Functional Status.   Therapy/Group: Individual Therapy  Leroy Libman 03/22/2015, 9:35 AM

## 2015-03-22 NOTE — Progress Notes (Addendum)
Subjective/Complaints: Pt c/o freq urination still. Orthostatic.    Has mild dysuria as well still  ROS: Pt denies fever, rash/itching, headache, blurred or double vision, nausea, vomiting, abdominal pain, diarrhea, chest pain, shortness of breath, palpitations, dysuria, dizziness, neck or back pain, bleeding, anxiety, or depression   Objective: Vital Signs: Blood pressure 162/75, pulse 91, temperature 98.4 F (36.9 C), temperature source Oral, resp. rate 18, weight 64.864 kg (143 lb), SpO2 97 %. No results found. Results for orders placed or performed during the hospital encounter of 03/18/15 (from the past 72 hour(s))  Urinalysis, Routine w reflex microscopic (not at [facility])     Status: Abnormal   Collection Time: 03/19/15  3:31 PM  Result Value Ref Range   Color, Urine YELLOW YELLOW   APPearance CLEAR CLEAR   Specific Gravity, Urine 1.016 1.005 - 1.030   pH 7.0 5.0 - 8.0   Glucose, UA 100 (A) NEGATIVE mg/dL   Hgb urine dipstick NEGATIVE NEGATIVE   Bilirubin Urine NEGATIVE NEGATIVE   Ketones, ur NEGATIVE NEGATIVE mg/dL   Protein, ur 30 (A) NEGATIVE mg/dL   Nitrite NEGATIVE NEGATIVE   Leukocytes, UA NEGATIVE NEGATIVE  Urine microscopic-add on     Status: None   Collection Time: 03/19/15  3:31 PM  Result Value Ref Range   Squamous Epithelial / LPF NONE SEEN NONE SEEN   WBC, UA 0-5 0 - 5 WBC/hpf   RBC / HPF NONE SEEN 0 - 5 RBC/hpf   Bacteria, UA NONE SEEN NONE SEEN  CBC WITH DIFFERENTIAL     Status: Abnormal   Collection Time: 03/21/15  5:42 AM  Result Value Ref Range   WBC 9.3 4.0 - 10.5 K/uL   RBC 2.70 (L) 4.22 - 5.81 MIL/uL   Hemoglobin 8.8 (L) 13.0 - 17.0 g/dL   HCT 27.2 (L) 39.0 - 52.0 %   MCV 100.7 (H) 78.0 - 100.0 fL   MCH 32.6 26.0 - 34.0 pg   MCHC 32.4 30.0 - 36.0 g/dL   RDW 19.9 (H) 11.5 - 15.5 %   Platelets 97 (L) 150 - 400 K/uL    Comment: PLATELET COUNT CONFIRMED BY SMEAR   Neutrophils Relative % 45 %   Lymphocytes Relative 25 %   Monocytes Relative 12 %    Eosinophils Relative 18 %   Basophils Relative 0 %   Neutro Abs 4.2 1.7 - 7.7 K/uL   Lymphs Abs 2.3 0.7 - 4.0 K/uL   Monocytes Absolute 1.1 (H) 0.1 - 1.0 K/uL   Eosinophils Absolute 1.7 (H) 0.0 - 0.7 K/uL   Basophils Absolute 0.0 0.0 - 0.1 K/uL   RBC Morphology ELLIPTOCYTES     Comment: SCHISTOCYTES PRESENT (2-5/hpf) POLYCHROMASIA PRESENT    WBC Morphology ATYPICAL LYMPHOCYTES   Comprehensive metabolic panel     Status: Abnormal   Collection Time: 03/21/15  5:42 AM  Result Value Ref Range   Sodium 139 135 - 145 mmol/L   Potassium 3.7 3.5 - 5.1 mmol/L   Chloride 100 (L) 101 - 111 mmol/L   CO2 31 22 - 32 mmol/L   Glucose, Bld 121 (H) 65 - 99 mg/dL   BUN 21 (H) 6 - 20 mg/dL   Creatinine, Ser 1.31 (H) 0.61 - 1.24 mg/dL   Calcium 8.6 (L) 8.9 - 10.3 mg/dL   Total Protein 4.9 (L) 6.5 - 8.1 g/dL   Albumin 2.6 (L) 3.5 - 5.0 g/dL   AST 15 15 - 41 U/L   ALT 14 (L) 17 -  63 U/L   Alkaline Phosphatase 84 38 - 126 U/L   Total Bilirubin 0.8 0.3 - 1.2 mg/dL   GFR calc non Af Amer 54 (L) >60 mL/min   GFR calc Af Amer >60 >60 mL/min    Comment: (NOTE) The eGFR has been calculated using the CKD EPI equation. This calculation has not been validated in all clinical situations. eGFR's persistently <60 mL/min signify possible Chronic Kidney Disease.    Anion gap 8 5 - 15        Assessment/Plan: 1. 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 Physiatrist is providing close team supervision and 24 hour management of active medical problems listed below. Physiatrist and rehab team continue to assess barriers to discharge/monitor patient progress toward functional and medical goals. FIM: Function - Bathing Bathing activity did not occur:  (declined shower 03/21/15) Position: Shower Body parts bathed by patient:  Right arm, Left arm, Chest, Abdomen, Front perineal area, Buttocks, Right upper leg, Left upper leg, Right lower leg, Left lower leg Assist Level: Assistive device, Touching or steadying assistance(Pt > 75%) Assistive Device Comment: long handled sponge  Function- Upper Body Dressing/Undressing What is the patient wearing?: Pull over shirt/dress Pull over shirt/dress - Perfomed by patient: Thread/unthread right sleeve, Thread/unthread left sleeve, Put head through opening, Pull shirt over trunk Assist Level: Supervision or verbal cues Function - Lower Body Dressing/Undressing What is the patient wearing?: Underwear, Pants, Shoes, Ted Hose Position: Wheelchair/chair at [employer] - Performed by patient: Thread/unthread right underwear leg, Thread/unthread left underwear leg, Pull underwear up/down Pants- Performed by patient: Pull pants up/down, Thread/unthread right pants leg, Thread/unthread left pants leg Non-skid slipper socks- Performed by helper: Don/doff right sock, Don/doff left sock Shoes - Performed by patient: Don/doff right shoe, Don/doff left shoe, Fasten right, Fasten left TED Hose - Performed by helper: Don/doff right TED hose, Don/doff left TED hose Assist for lower body dressing: Assistive device, Touching or steadying assistance (Pt > 75%) Assistive Device Comment: reacher  Function - Toileting Toileting steps completed by patient: Performs perineal hygiene, Adjust clothing prior to toileting, Adjust clothing after toileting Toileting Assistive Devices: Grab bar or rail Assist level: Touching or steadying assistance (Pt.75%)  Function - Air cabin crew transfer assistive device: Grab bar, Walker Assist level to toilet: Touching or steadying assistance (Pt > 75%) Assist level from toilet: Touching or steadying assistance (Pt > 75%)  Function - Chair/bed transfer Chair/bed transfer method: Stand pivot, Ambulatory Chair/bed transfer assist level: Touching or  steadying assistance (Pt > 75%) Chair/bed transfer assistive device: Armrests Chair/bed transfer details: Verbal cues for precautions/safety  Function - Locomotion: Wheelchair Will patient use wheelchair at discharge?: No Type: Manual Max wheelchair distance: 175 Assist Level: Supervision or verbal cues, Touching or steadying assistance (Pt > 75%) Assist Level: Supervision or verbal cues, Touching or steadying assistance (Pt > 75%) Assist Level: Supervision or verbal cues, Touching or steadying assistance (Pt > 75%) Turns around,maneuvers to table,bed, and toilet,negotiates 3% grade,maneuvers on rugs and over doorsills: No Function - Locomotion: Ambulation Assistive device: Walker-rolling Max distance: 500 Assist level: Touching or steadying assistance (Pt > 75%) Assist level: Supervision or verbal cues Assist level: Touching or steadying assistance (Pt > 75%) Walk 150 feet activity did not occur: Safety/medical concerns (fatigue) Assist level: Touching or steadying assistance (Pt > 75%) Assist level: Touching or steadying assistance (Pt >  75%)  Function - Comprehension Comprehension: Auditory Comprehension assist level: Understands complex 90% of the time/cues 10% of the time  Function - Expression Expression: Verbal Expression assist level: Expresses complex 90% of the time/cues < 10% of the time  Function - Social Interaction Social Interaction assist level: Interacts appropriately with others - No medications needed.  Function - Problem Solving Problem solving assist level: Solves basic 90% of the time/requires cueing < 10% of the time  Function - Memory Memory assist level: Recognizes or recalls 90% of the time/requires cueing < 10% of the time Patient normally able to recall (first 3 days only): Current season, Location of own room, Staff names and faces, That he or she is in a hospital  Medical Problem List and Plan: 1.  Weakness/neuropathy, debilitation secondary to  myelofibrosis and treatment. Have contacted Dr. Julien Nordmann oncology services regarding patient's admission to inpatient rehab.             -continue cir 2.  DVT Prophylaxis/Anticoagulation: SCDs. Monitor for any signs of DVT. OOB/ambulation 3. Pain Management: Tylenol as needed 4. Hypertension. Norvasc 10 mg daily. Now exhibiting orthostasis  -TEDS/transitional measures when going from sit to stand 5. Neuropsych: This patient is capable of making decisions on his own behalf. 6. Skin/Wound Care: Routine skin checks             -continue topical rx  -skin still somewhat dry 7. Fluids/Electrolytes/Nutrition: Routine I&O with follow-up chemistries. Encourage PO 8. History of hip fracture with recurrent dislocation. Hip brace at all times with hip precautions.(has another month in brace) Weightbearing as tolerated. 9. GERD. 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 Constipation: sorbitol on admit. Suppository if needed             -daily senokot-s 13.  BPH/Urinary freq with mild dysuria- UA neg, ucx pending  -enablex increased to 80m  -holding flomax due to orthostasis  -pvr's 200-300  -encouraged OOB/EOB to void  LOS (Days) 4 A FACE TO FACE EVALUATION WAS PERFORMED  Jacquis Paxton T 03/22/2015, 8:46 AM

## 2015-03-22 NOTE — Progress Notes (Addendum)
Patient with OT. C/O dizziness, orthostatic symptoms. Thigh high teds on. B/P sitting= 104/76,  standing= 70/46, HR = 102.  .Abdominal binder obtained and placed on patient. Reassessed after 5 minutes; B/P 75/45 and pulse 104.  Patient ambulated to gym with OT.... B/P 55/45. Patient ambulated to room with assistance.  Up in recliner. Silvestre Mesi, PA made aware. Continue to monitor.  12:15 Patient remains up in recliner. B/P 135/70, HRR= 80. Denies any dizziness. Continue to monitor.

## 2015-03-22 NOTE — Progress Notes (Signed)
Occupational Therapy Session Note  Patient Details  Name: Thomas Cherry MRN: [id number] Date of Birth: 02-10-1946  Today's Date: 03/22/2015 OT Individual Time: 1100-1145 OT Individual Time Calculation (min): 45 min  Missed 15 min due to low BP and Rn requested pt return to room to rest  Skilled Therapeutic Interventions/Progress Updates:    1:1 Therapeutic activity: Pt with low BP today in standing position. Abdominal binder applied.  Pt continued to have BP 70s/50s and was reporting dizziness. Attempted to participate in sit to stand and brief dynamic standing activities with min A; however unable to tolerate standing longer than 3 min before needing to sit down due to low BP.  Rn and Pa made aware.  Pt participated in standing on uneven soft surface with eyes open with steadying A to challenge balance.  Pt requested to attempt to ambulate.  Ambulated from his room to the ADL apartment and BP was 55/44. Rn recommended return to his room to rest.   Therapy Documentation Precautions:  Precautions Precautions: Posterior Hip, Fall Required Braces or Orthoses: Other Brace/Splint Other Brace/Splint: LLE brace at all times Restrictions Weight Bearing Restrictions: No LLE Weight Bearing: Weight bearing as tolerated Other Position/Activity Restrictions: LLE brace at all times to prevent hip dislocation  General OT Amount of Missed Time: 15 Minutes Vital Signs: Therapy Vitals BP: (!) 76/48 mmHg Patient Position (if appropriate): Standing Pain: Pain Assessment Pain Assessment: No/denies pain ADL: ADL ADL Comments: refer to functional navigator  See Function Navigator for Current Functional Status.   Therapy/Group: Individual Therapy  [name] [facility] 03/22/2015, 2:52 PM

## 2015-03-22 NOTE — Progress Notes (Signed)
Social Work Patient ID: Thomas Cherry, male   DOB: Feb 10, 1946, 70 y.o.   MRN: [id number]   Lowella Curb, LCSW Social Worker Signed Physical Medicine and Rehabilitation Patient Care Conference 03/22/2015  4:12 PM    Expand All Collapse All   Inpatient RehabilitationTeam Conference and Plan of Care Update Date: 03/22/2015   Time: 2:35 PM     Patient Name: Thomas Cherry       Medical Record Number: [id number]  Date of Birth: [birth year] Sex: Male         Room/Bed: 4M06C/4M06C-01 Payor Info: Payor: MEDICARE / Plan: MEDICARE PART A AND B / Product Type: *No Product type* /    Admitting Diagnosis: Bone marrow transplant,debility, sepsis  Admit Date/Time:  03/18/2015  2:58 PM Admission Comments: No comment available   Primary Diagnosis:  <principal problem not specified> Principal Problem: <principal problem not specified>    Patient Active Problem List     Diagnosis  Date Noted   .  Debility  03/18/2015   .  Peripheral neuropathy, secondary to drugs or chemicals  03/18/2015   .  Skin tear of elbow without complication  [id number]   .  Hip dislocation, left ([place])  01/23/2015   .  Myelofibrosis ([place])  06/17/2013   .  Nonspecific abnormal electrocardiogram (ECG) (EKG)  03/26/2013   .  Seborrhea  03/24/2012   .  Palpitation     .  Essential hypertension     .  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  Essential thrombocythemia ([place])  12/28/2010   .  Rash  11/24/2010   .  Toe pain, left  11/16/2010   .  Thrombocytosis ([place])  11/16/2010   .  Personal history of colonic polyps  06/14/2010   .  Screening for prostate cancer  06/02/2010   .  Pure hypercholesterolemia  06/02/2010   .  Inguinal hernia  06/02/2010   .  Encounter for long-term (current) use of other medications  06/02/2010   .  CERUMEN IMPACTION, RIGHT  03/26/2008   .  GERD  10/11/2006     Expected Discharge Date: Expected Discharge Date: 03/26/15 (if medically stable)  Team Members Present: Physician leading  conference: Dr. Alger Simons Social Worker Present: Lennart Pall, LCSW Nurse Present: Heather Roberts, RN PT Present: Carney Living, PT OT Present: Roanna Epley, [place], OT SLP Present: Weston Anna, SLP PPS Coordinator present : Daiva Nakayama, RN, CRRN        Current Status/Progress  Goal  Weekly Team Focus   Medical     debility, neuropathy d/t myelofibrosis, recent left hip dislocation  improve balance, stamina  bp control, bladder emptying   Bowel/Bladder     Continent bowel and bladder; frequent small voids, PVRs 130s-140s, double voids, stand to void.  No retention, no s/s infection.  Continue PVRs, assist to stand for voids.    Swallow/Nutrition/ Hydration               ADL's     occasional steadying A required during self care for LB, generalized weakness that limits standing tolerance, decreased safety awareness with use of RW and standing techniques  supervision overall with BADLs  ADL retraining, functional mobiity and activity tolerance training, balance, pt/family education   Mobility     min A overall, decreased safety awareness, orthostatic  supervision overall  functional mobility, strengthening, activity tolerance, safety awareness, standing balance, pt/family education   Communication  Safety/Cognition/ Behavioral Observations    No unsafe behaviors, calls for assist. Orthostatic.  No falls, injury this admission; orthostasis managed.   Monitor, encourage OOB.   Pain     Denies any pain, discomfort.  MAintain current status  Monitor, observe for non-verbal S/S pain.    Skin     Skin tears right arm with foam; ecchymosis BUEs; heels boggy, floated; skin dry, flaky; using cream from home.  Areas resolving by discharge.  Monitor, continue plan of care.    Rehab Goals Patient on target to meet rehab goals: Yes *See Care Plan and progress notes for long and short-term goals.    Barriers to Discharge:  multiple medical issues, ongoing need for  pharm mgt of med conditions, orthostasis     Possible Resolutions to Barriers:   continued acclimation, med adjustment      Discharge Planning/Teaching Needs:   Plan to d/c home with wife and daughter able to provide 24/7 assistance.  To be scheduled.    Team Discussion:    Very deconditioned and continues with BP issues (worse today?)  Pt pushes to keep going even when significant BP symptoms occur - no a good judge of when he needs to stop activity.  Making changes to meds and adding abd binder.  Supervision goals - continues to require cues to adhere to hip precautions.   Revisions to Treatment Plan:    None    Continued Need for Acute Rehabilitation Level of Care: The patient requires daily medical management by a physician with specialized training in physical medicine and rehabilitation for the following conditions: Daily direction of a multidisciplinary physical rehabilitation program to ensure safe treatment while eliciting the highest outcome that is of practical value to the patient.: Yes Daily medical management of patient stability for increased activity during participation in an intensive rehabilitation regime.: Yes Daily analysis of laboratory values and/or radiology reports with any subsequent need for medication adjustment of medical intervention for : Neurological problems;Post surgical problems;Wound care problems;Blood pressure problems  Bernece Gall 03/22/2015, 4:12 PM

## 2015-03-22 NOTE — Progress Notes (Signed)
Orthopedic Tech Progress Note Patient Details:  Thomas Cherry 05-26-1945 [id number]  Ortho Devices Type of Ortho Device: Abdominal binder Ortho Device/Splint Interventions: Application   [name] 03/22/2015, 7:45 AM

## 2015-03-22 NOTE — Progress Notes (Signed)
Pt voided again as RN went to do I& O cath. New PVR was less than 350 so I&O cath was no longer needed.

## 2015-03-23 ENCOUNTER — Inpatient Hospital Stay (HOSPITAL_COMMUNITY): Payer: Medicare Other | Admitting: Physical Therapy

## 2015-03-23 ENCOUNTER — Inpatient Hospital Stay (HOSPITAL_COMMUNITY): Payer: Medicare Other | Admitting: Occupational Therapy

## 2015-03-23 ENCOUNTER — Inpatient Hospital Stay (HOSPITAL_COMMUNITY): Payer: Medicare Other

## 2015-03-23 DIAGNOSIS — R7989 Other specified abnormal findings of blood chemistry: Secondary | ICD-10-CM

## 2015-03-23 DIAGNOSIS — R350 Frequency of micturition: Secondary | ICD-10-CM

## 2015-03-23 LAB — BASIC METABOLIC PANEL
Anion gap: 8 (ref 5–15)
BUN: 29 mg/dL — AB (ref 6–20)
CO2: 30 mmol/L (ref 22–32)
CREATININE: 1.45 mg/dL — AB (ref 0.61–1.24)
Calcium: 8.7 mg/dL — ABNORMAL LOW (ref 8.9–10.3)
Chloride: 102 mmol/L (ref 101–111)
GFR calc Af Amer: 55 mL/min — ABNORMAL LOW (ref >60)
GFR, EST NON AFRICAN AMERICAN: 47 mL/min — AB (ref >60)
GLUCOSE: 96 mg/dL (ref 65–99)
Potassium: 4 mmol/L (ref 3.5–5.1)
SODIUM: 140 mmol/L (ref 135–145)

## 2015-03-23 LAB — URINE CULTURE: Culture: 50000

## 2015-03-23 NOTE — Progress Notes (Addendum)
Subjective/Complaints: Dizzy, very orthostatic yesterday. Voiding seemed better . Feels well this am. Slept well    ROS: Pt denies fever, rash/itching, headache, blurred or double vision, nausea, vomiting, abdominal pain, diarrhea, chest pain, shortness of breath, palpitations, dysuria, neck or back pain, bleeding, anxiety, or depression   Objective: Vital Signs: Blood pressure 76/48, pulse 91, temperature 98.4 F (36.9 C), temperature source Oral, resp. rate 18, height '5\' 9"'$  (1.753 m), weight 64.864 kg (143 lb), SpO2 97 %. No results found. Results for orders placed or performed during the hospital encounter of 03/18/15 (from the past 72 hour(s))  CBC WITH DIFFERENTIAL     Status: Abnormal   Collection Time: 03/21/15  5:42 AM  Result Value Ref Range   WBC 9.3 4.0 - 10.5 K/uL   RBC 2.70 (L) 4.22 - 5.81 MIL/uL   Hemoglobin 8.8 (L) 13.0 - 17.0 g/dL   HCT 27.2 (L) 39.0 - 52.0 %   MCV 100.7 (H) 78.0 - 100.0 fL   MCH 32.6 26.0 - 34.0 pg   MCHC 32.4 30.0 - 36.0 g/dL   RDW 19.9 (H) 11.5 - 15.5 %   Platelets 97 (L) 150 - 400 K/uL    Comment: PLATELET COUNT CONFIRMED BY SMEAR   Neutrophils Relative % 45 %   Lymphocytes Relative 25 %   Monocytes Relative 12 %   Eosinophils Relative 18 %   Basophils Relative 0 %   Neutro Abs 4.2 1.7 - 7.7 K/uL   Lymphs Abs 2.3 0.7 - 4.0 K/uL   Monocytes Absolute 1.1 (H) 0.1 - 1.0 K/uL   Eosinophils Absolute 1.7 (H) 0.0 - 0.7 K/uL   Basophils Absolute 0.0 0.0 - 0.1 K/uL   RBC Morphology ELLIPTOCYTES     Comment: SCHISTOCYTES PRESENT (2-5/hpf) POLYCHROMASIA PRESENT    WBC Morphology ATYPICAL LYMPHOCYTES   Comprehensive metabolic panel     Status: Abnormal   Collection Time: 03/21/15  5:42 AM  Result Value Ref Range   Sodium 139 135 - 145 mmol/L   Potassium 3.7 3.5 - 5.1 mmol/L   Chloride 100 (L) 101 - 111 mmol/L   CO2 31 22 - 32 mmol/L   Glucose, Bld 121 (H) 65 - 99 mg/dL   BUN 21 (H) 6 - 20 mg/dL   Creatinine, Ser 1.31 (H) 0.61 - 1.24 mg/dL   Calcium 8.6 (L) 8.9 - 10.3 mg/dL   Total Protein 4.9 (L) 6.5 - 8.1 g/dL   Albumin 2.6 (L) 3.5 - 5.0 g/dL   AST 15 15 - 41 U/L   ALT 14 (L) 17 - 63 U/L   Alkaline Phosphatase 84 38 - 126 U/L   Total Bilirubin 0.8 0.3 - 1.2 mg/dL   GFR calc non Af Amer 54 (L) >60 mL/min   GFR calc Af Amer >60 >60 mL/min    Comment: (NOTE) The eGFR has been calculated using the CKD EPI equation. This calculation has not been validated in all clinical situations. eGFR's persistently <60 mL/min signify possible Chronic Kidney Disease.    Anion gap 8 5 - 15  Culture, Urine     Status: None (Preliminary result)   Collection Time: 03/21/15 10:36 AM  Result Value Ref Range   Specimen Description URINE, CLEAN CATCH    Special Requests NONE    Culture      50,000 COLONIES/mL STAPHYLOCOCCUS SPECIES (COAGULASE NEGATIVE)   Report Status PENDING   Basic metabolic panel     Status: Abnormal   Collection Time: 03/23/15  7:13 AM  Result Value Ref Range   Sodium 140 135 - 145 mmol/L   Potassium 4.0 3.5 - 5.1 mmol/L   Chloride 102 101 - 111 mmol/L   CO2 30 22 - 32 mmol/L   Glucose, Bld 96 65 - 99 mg/dL   BUN 29 (H) 6 - 20 mg/dL   Creatinine, Ser 1.45 (H) 0.61 - 1.24 mg/dL   Calcium 8.7 (L) 8.9 - 10.3 mg/dL   GFR calc non Af Amer 47 (L) >60 mL/min   GFR calc Af Amer 55 (L) >60 mL/min    Comment: (NOTE) The eGFR has been calculated using the CKD EPI equation. This calculation has not been validated in all clinical situations. eGFR's persistently <60 mL/min signify possible Chronic Kidney Disease.    Anion gap 8 5 - 15        Assessment/Plan: 1. 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 Physiatrist is providing close team supervision and 24 hour management of active medical problems listed below. Physiatrist and rehab team  continue to assess barriers to discharge/monitor patient progress toward functional and medical goals. FIM: Function - Bathing Bathing activity did not occur:  (declined shower 03/21/15) Position: Shower Body parts bathed by patient: Right arm, Left arm, Chest, Abdomen, Front perineal area, Buttocks, Right upper leg, Left upper leg, Right lower leg, Left lower leg Assist Level: Assistive device, Touching or steadying assistance(Pt > 75%) Assistive Device Comment: long handled sponge  Function- Upper Body Dressing/Undressing What is the patient wearing?: Pull over shirt/dress Pull over shirt/dress - Perfomed by patient: Thread/unthread right sleeve, Thread/unthread left sleeve, Put head through opening, Pull shirt over trunk Assist Level: Supervision or verbal cues Function - Lower Body Dressing/Undressing What is the patient wearing?: Pants Position: Sitting EOB Underwear - Performed by patient: Thread/unthread right underwear leg, Thread/unthread left underwear leg, Pull underwear up/down Pants- Performed by patient: Pull pants up/down, Thread/unthread right pants leg, Thread/unthread left pants leg Non-skid slipper socks- Performed by helper: Don/doff right sock, Don/doff left sock Shoes - Performed by patient: Don/doff right shoe, Don/doff left shoe, Fasten right, Fasten left TED Hose - Performed by helper: Don/doff right TED hose, Don/doff left TED hose Assist for lower body dressing: Touching or steadying assistance (Pt > 75%) Assistive Device Comment: reacher  Function - Toileting Toileting steps completed by patient: Performs perineal hygiene, Adjust clothing prior to toileting, Adjust clothing after toileting Toileting steps completed by helper: Adjust clothing prior to toileting, Performs perineal hygiene, Adjust clothing after toileting Toileting Assistive Devices: Grab bar or rail Assist level: Touching or steadying assistance (Pt.75%)  Function - Air cabin crew  transfer assistive device: Grab bar Assist level to toilet: Touching or steadying assistance (Pt > 75%) Assist level from toilet: Touching or steadying assistance (Pt > 75%)  Function - Chair/bed transfer Chair/bed transfer method: Stand pivot, Ambulatory Chair/bed transfer assist level: Touching or steadying assistance (Pt > 75%) Chair/bed transfer assistive device: Armrests Chair/bed transfer details: Verbal cues for precautions/safety  Function - Locomotion: Wheelchair Will patient use wheelchair at discharge?: No Type: Manual Max wheelchair distance: 150 Assist Level: Supervision or verbal cues Assist Level: Supervision or verbal cues Assist Level: Supervision or verbal cues Turns around,maneuvers to table,bed, and toilet,negotiates 3% grade,maneuvers on rugs and over doorsills: No Function - Locomotion: Ambulation Assistive device: Walker-rolling Max distance: 500 Assist level: Touching or steadying assistance (Pt > 75%)  Assist level: Supervision or verbal cues Assist level: Touching or steadying assistance (Pt > 75%) Walk 150 feet activity did not occur: Safety/medical concerns (fatigue) Assist level: Touching or steadying assistance (Pt > 75%) Assist level: Touching or steadying assistance (Pt > 75%)  Function - Comprehension Comprehension: Auditory Comprehension assist level: Understands complex 90% of the time/cues 10% of the time  Function - Expression Expression: Verbal Expression assist level: Expresses complex 90% of the time/cues < 10% of the time  Function - Social Interaction Social Interaction assist level: Interacts appropriately with others - No medications needed.  Function - Problem Solving Problem solving assist level: Solves basic 90% of the time/requires cueing < 10% of the time  Function - Memory Memory assist level: Recognizes or recalls 90% of the time/requires cueing < 10% of the time Patient normally able to recall (first 3 days only): Current  season, Location of own room, Staff names and faces, That he or she is in a hospital  Medical Problem List and Plan: 1.  Weakness/neuropathy, debilitation secondary to myelofibrosis and treatment. Have contacted Dr. Julien Nordmann oncology services regarding patient's admission to inpatient rehab.             -continue cir therapies. Target 2/17 dc if medically ready 2.  DVT Prophylaxis/Anticoagulation: SCDs. Monitor for any signs of DVT. OOB/ambulation 3. Pain Management: Tylenol as needed 4. Hypertension/orthostasis: Norvasc DECREASED to '5mg'$ --changed to dinner time   -abd binder/TEDS/transitional measures when going from sit to stand  -labs reviewed personally and he's dry.----encourage fluids  -recheck labs in am  -florinef added to better sustain bp 5. Neuropsych: This patient is capable of making decisions on his own behalf. 6. Skin/Wound Care: Routine skin checks             -continue topical rx  -skin still somewhat dry 7. Fluids/Electrolytes/Nutrition: Routine I&O with follow-up chemistries. Encourage PO 8. History of hip fracture with recurrent dislocation. Hip brace at all times with hip precautions.(has another month in brace) Weightbearing as tolerated. 9. GERD. 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 Constipation: sorbitol on admit. Suppository if needed             -daily senokot-s 13.  BPH/Urinary freq: some improvement  - UA neg, ucx with 50k coag neg staph upon review  -decreased enablex to 7.'5mg'$   -holding flomax due to orthostasis  -pvr's lower 100-200cc  -encouraged OOB/EOB to void  LOS (Days) 5 A FACE TO FACE EVALUATION WAS PERFORMED  SWARTZ,ZACHARY T 03/23/2015, 8:50 AM

## 2015-03-23 NOTE — Progress Notes (Signed)
Occupational Therapy Session Note  Patient Details  Name: Thomas Cherry MRN: [id number] Date of Birth: [birth year]  Today's Date: 03/23/2015 OT Individual Time: 1103-1200 OT Individual Time Calculation (min): 57 min    Short Term Goals: Week 1:  OT Short Term Goal 1 (Week 1): STGs = LTGs due to short LOS  Skilled Therapeutic Interventions/Progress Updates:    Pt completed toileting in standing with the RW and supervision.  Pt doing well with static standing balance, without UE support during session.  BP taken in sitting at 123/69 to begin session and then in standing after 4-5 mins at 85/54.  Pt ambulated to the therapy gym with supervision using the RW and no report of dizziness.  Worked on standing balance and balance reactions using the Wii balance board. Pt demonstrates increased posterior weightbearing over bilateral heels with static standing.  Decreased ability to demonstrate forward weightshift over the balls of his feet without min facilitation from therapist.  He attempts to flex his knees to shift his weight instead of using appropriate ankle strategies.  Pt with only one episode of standing in which he reported dizziness throughout session, needing to sit down to rest.  All other intervals were for standing from 2-5 mins without difficulty and min guard assist on balance board, without UE support.  Pt returned to room at end of session and placed in recliner in preparation for lunch.    Therapy Documentation Precautions:  Precautions Precautions: Posterior Hip, Fall Required Braces or Orthoses: Other Brace/Splint Other Brace/Splint: LLE brace at all times Restrictions Weight Bearing Restrictions: No LLE Weight Bearing: Weight bearing as tolerated Other Position/Activity Restrictions: LLE brace at all times to prevent hip dislocation  Vital Signs: Therapy Vitals BP: (!) 85/54 mmHg Patient Position (if appropriate): Standing Oxygen Therapy SpO2: 98 % O2 Device: Not  Delivered Pain: Pain Assessment Pain Assessment: No/denies pain ADL: See Function Navigator for Current Functional Status.   Therapy/Group: Individual Therapy  Hilarie Sinha OTR/L 03/23/2015, 12:08 PM

## 2015-03-23 NOTE — Progress Notes (Signed)
Occupational Therapy Session Note  Patient Details  Name: Thomas Cherry MRN: [id number] Date of Birth: Sep 02, 1945  Today's Date: 03/23/2015 OT Individual Time: 0930-1015 OT Individual Time Calculation (min): 45 min  and Today's Date: 03/23/2015 OT Missed Time: 30 Minutes Missed Time Reason: Other (comment) (low BP)   Short Term Goals: Week 1:  OT Short Term Goal 1 (Week 1): STGs = LTGs due to short LOS  Skilled Therapeutic Interventions/Progress Updates:    Pt resting in bed upon arrival and "ready" for therapy.  Pt encouraged because he had earlier stood at toilet to void and his BP was "good."  Initial BP readings without Ted hose and abdominal binder (sitting-110/61; standing-77/42). Pt doffed pants with sit<>stand from EOB and donned clean underpants and pants; Ted hose donned and abdominal binder in addition to LLE brace. BP readings with Ted hose and abdominal binder (standing-70/37 and 80/48). Pt transferred to recliner and remained with all needs within reach.  RN notified and attended to patient.  Pt missed 30 mins skilled OT services secondary to low BP. Pt disappointed because he wanted to walk so he could get home.    Therapy Documentation Precautions:  Precautions Precautions: Posterior Hip, Fall Required Braces or Orthoses: Other Brace/Splint Other Brace/Splint: LLE brace at all times Restrictions Weight Bearing Restrictions: No LLE Weight Bearing: Weight bearing as tolerated Other Position/Activity Restrictions: LLE brace at all times to prevent hip dislocation General: General OT Amount of Missed Time: 30 Minutes  Pain: Pain Assessment Pain Assessment: No/denies pain  See Function Navigator for Current Functional Status.   Therapy/Group: Individual Therapy  Leroy Libman 03/23/2015, 10:37 AM

## 2015-03-23 NOTE — Progress Notes (Signed)
Social Work Patient ID: Thomas Cherry, male   DOB: 1945/04/16, 70 y.o.   MRN: [id number]   Have reviewed team conference with pt and wife.  Both aware and agreeable with targeted d/c date of 2/11 and understand this could be changed if medically needed.  Supervision goals and wife confirms family is able to provide this.  Will continue to follow.  Lavonda Thal, LCSW

## 2015-03-23 NOTE — Progress Notes (Signed)
Physical Therapy Session Note  Patient Details  Name: Thomas Cherry MRN: [id number] Date of Birth: 1945/02/17  Today's Date: 03/23/2015 PT Individual Time: 1305-1415 PT Individual Time Calculation (min): 70 min   Short Term Goals: Week 1:  PT Short Term Goal 1 (Week 1): = LTGs due to anticipated LOS  Skilled Therapeutic Interventions/Progress Updates:   Patient received exiting bathroom with RN using RW, performed hand hygiene at sink with cues for keeping RW in front of patient and walking straight up to sink instead of trying to set RW out of way. Patient propelled wheelchair to gym using BUE with supervision, extra time, and cues for technique. Functional ambulation on tiled and carpeted surfaces x > 300 ft using RW with supervision, max verbal/visual cues for safe negotiation of narrow spaces and sidestepping using RW. Performed sidestepping to R and L 4 x 15 ft and retro gait 2 x 15 ft using RW with supervision and cues for postural control. Stair training up/down 12 (6") stairs using 2 rails with supervision and reciprocal pattern. Vitals monitored throughout and patient asymptomatic, see doc flow sheet. After negotiating stairs, standing BP 77/45. Patient propelled wheelchair throughout rehab unit and on/off elevators with supervision and max cues for sequencing turns. Outdoor ambulation using RW on brick and concrete surfaces, inclines/declines, over thresholds and uneven surfaces, and up/down 5 brick steps using 1 rail with supervision and cues for decreasing speed due to safety concerns.  Patient's wife arrived and engaged in pt/family education regarding DME needs (patient owns RW and will need manual wheelchair), follow up HHPT, and safety recommendations for 24/7 supervision for any standing and ambulation due to orthostatic hypotension and increased falls risk after discharge and patient to remain at wheelchair level or seated for brief periods of time when family is not available. Patient  and wife verbalized understanding and in agreement. Patient left semi reclined in bed with needs within reach and wife in room.   Therapy Documentation Precautions:  Precautions Precautions: Posterior Hip, Fall Required Braces or Orthoses: Other Brace/Splint Other Brace/Splint: LLE brace at all times Restrictions Weight Bearing Restrictions: No LLE Weight Bearing: Weight bearing as tolerated Other Position/Activity Restrictions: LLE brace at all times to prevent hip dislocation Pain: Pain Assessment Pain Assessment: No/denies pain   See Function Navigator for Current Functional Status.   Therapy/Group: Individual Therapy  Laretta Alstrom 03/23/2015, 3:33 PM

## 2015-03-24 ENCOUNTER — Inpatient Hospital Stay (HOSPITAL_COMMUNITY): Payer: Medicare Other

## 2015-03-24 ENCOUNTER — Inpatient Hospital Stay (HOSPITAL_COMMUNITY): Payer: Medicare Other | Admitting: Physical Therapy

## 2015-03-24 DIAGNOSIS — N390 Urinary tract infection, site not specified: Secondary | ICD-10-CM

## 2015-03-24 DIAGNOSIS — A499 Bacterial infection, unspecified: Secondary | ICD-10-CM

## 2015-03-24 LAB — BASIC METABOLIC PANEL
Anion gap: 10 (ref 5–15)
BUN: 29 mg/dL — AB (ref 6–20)
CHLORIDE: 101 mmol/L (ref 101–111)
CO2: 29 mmol/L (ref 22–32)
CREATININE: 1.33 mg/dL — AB (ref 0.61–1.24)
Calcium: 8.7 mg/dL — ABNORMAL LOW (ref 8.9–10.3)
GFR calc Af Amer: >60 mL/min (ref >60)
GFR calc non Af Amer: 53 mL/min — ABNORMAL LOW (ref >60)
GLUCOSE: 86 mg/dL (ref 65–99)
POTASSIUM: 3.8 mmol/L (ref 3.5–5.1)
Sodium: 140 mmol/L (ref 135–145)

## 2015-03-24 MED ORDER — NITROFURANTOIN MONOHYD MACRO 100 MG PO CAPS
100.0000 mg | ORAL_CAPSULE | Freq: Two times a day (BID) | ORAL | Status: DC
  Administered 2015-03-24 – 2015-03-26 (×5): 100 mg via ORAL
  Filled 2015-03-24 (×7): qty 1

## 2015-03-24 NOTE — Progress Notes (Signed)
Occupational Therapy Session Note  Patient Details  Name: Thomas Cherry MRN: [id number] Date of Birth: [birth year]  Today's Date: 03/24/2015 OT Individual Time: [id number] OT Individual Time Calculation (min): 75 min    Short Term Goals: Week 1:  OT Short Term Goal 1 (Week 1): STGs = LTGs due to short LOS  Skilled Therapeutic Interventions/Progress Updates:    Pt resting in bed upon arrival.  Pt agreeable to BADL retraining including bathing and dressing with sit<>stand from w/c at sink.  Pt amb with RW to bathroom to use toilet prior to sitting in w/c for BADLs.  Pt with s/s of low BP during session (BP readings: sitting-110/62 and 101/72; standing with Ted hose and abd binder-66/42 and 65/43). Pt required assistance with donning Ted hose and LLE brace.  Pt did not report any dizziness throughout session.  Pt required more than a reasonable amount of time to complete all tasks.  Discussed recommendations for using bathroom during night after discharge.  Recommended patient sit on toilet when urinating or using urinal at bedside.  Pt stated he preferred to not use urinal.  Recommended urinal use if patient was the least light headed.  Pt verbalized understanding.  Focus on activity tolerance, continued discharge planning, functional amb with RW, and safety awareness to increase independence with BADLs.   Therapy Documentation Precautions:  Precautions Precautions: Posterior Hip, Fall Required Braces or Orthoses: Other Brace/Splint Other Brace/Splint: LLE brace at all times Restrictions Weight Bearing Restrictions: No LLE Weight Bearing: Weight bearing as tolerated Other Position/Activity Restrictions: LLE brace at all times to prevent hip dislocation Pain:  Pt denied pain  See Function Navigator for Current Functional Status.   Therapy/Group: Individual Therapy  Leroy Libman 03/24/2015, 12:18 PM

## 2015-03-24 NOTE — Progress Notes (Signed)
Occupational Therapy Session Note  Patient Details  Name: Thomas Cherry MRN: [id number] Date of Birth: 1945/05/12  Today's Date: 03/24/2015 OT Individual Time: 1100-1200 OT Individual Time Calculation (min): 60 min   Short Term Goals: Week 1:  OT Short Term Goal 1 (Week 1): STGs = LTGs due to short LOS  Skilled Therapeutic Interventions/Progress Updates: Therapeutic activity with focus on improved endurance, functional mobility, dynamic standing balance, safety awareness and memory.    Pt able to recall posterior hip precautions w/o cues at beginning of session.   Pt aware of fall risk with sudden onset of low BP but agreeable to mobility and challenge of standing tasks.   Pt ambulates using RW from room to gym, rests, and performs 2 sets of standing arm ergometry, 5 minutes each, level 1, random routine.   Pt rests and ambulates to BI gym, pushing wheeled TV cart as therapist follows.   No LOB noted; pt reports no increase in symptoms.   In BI gym, pt completes standing reaction test using dynavision, 2-minute routine, 2 times.   Pt rests seated and ambulates back to his room using RW, 343" with supervision only.  HR/BP re-assessed throughout session; pt only reports fatigue intermittently and rests as needed, approx 2 minutes each during transitions from task to task.  1.  98/55 100 bpm   11:10 standing 2. 118/66 98 bpm    11:20 seated 3. 128/66 100 bpm  11:28 seated 4. 114/67 100 bpm  11:38 seated 5.  87/53 126 bpm   12:00 standing    Therapy Documentation Precautions:  Precautions Precautions: Posterior Hip, Fall Required Braces or Orthoses: Other Brace/Splint Other Brace/Splint: LLE brace at all times Restrictions Weight Bearing Restrictions: No LLE Weight Bearing: Weight bearing as tolerated Other Position/Activity Restrictions: LLE brace at all times to prevent hip dislocation  Pain: No/denies pain   ADL: ADL ADL Comments: refer to functional navigator  See Function  Navigator for Current Functional Status.   Therapy/Group: Individual Therapy  [place] 03/24/2015, 12:29 PM

## 2015-03-24 NOTE — Progress Notes (Signed)
Physical Therapy Session Note  Patient Details  Name: Thomas Cherry MRN: [id number] Date of Birth: 1945/12/17  Today's Date: 03/24/2015 PT Individual Time: 1420-1520 PT Individual Time Calculation (min): 60 min   Short Term Goals: Week 1:  PT Short Term Goal 1 (Week 1): = LTGs due to anticipated LOS  Skilled Therapeutic Interventions/Progress Updates:   Session focused on standing tolerance, balance, safety awareness, and activity tolerance. Patient performed bed mobility with mod I, propelled wheelchair x 150 + 100 ft using BUE with supervision and cues for sequencing turning and increased time, gait using RW 2 x 150 ft with supervision, and sit <> stand transfers using RW with supervision. Vitals assessed throughout with patient remaining asymptomatic, see below. Performed Dynavision mode A in standing to work on balance and activity tolerance x 3 trials with avg of score of 59.67 and avg reaction time 1.01 sec. Reassessed Berg Balance Scale with score of 41/56 improved from 20/56 at evaluation. Reassessed TUG using RW with supervision = 14 sec improved from 19.5 sec on evaluation. Patient educated on progress to date and continued risk of falls due to orthostasis and reviewed recommendation for supervision with standing/ambulation at DC, patient verbalized understanding. Patient ambulated back to room and left sitting in recliner with all needs within reach.   Therapy Documentation Precautions:  Precautions Precautions: Posterior Hip, Fall Required Braces or Orthoses: Other Brace/Splint Other Brace/Splint: LLE brace at all times Restrictions Weight Bearing Restrictions: No LLE Weight Bearing: Weight bearing as tolerated Other Position/Activity Restrictions: LLE brace at all times to prevent hip dislocation Vital Signs: Beginning of session: Seated BP 118/66, standing BP 79/46 After propelling wheelchair: Standing BP 87/56 Pain:  Denies pain  Balance: Balance Balance Assessed:  Yes Standardized Balance Assessment Standardized Balance Assessment: Berg Balance Test Berg Balance Test Sit to Stand: Able to stand  independently using hands Standing Unsupported: Able to stand safely 2 minutes Sitting with Back Unsupported but Feet Supported on Floor or Stool: Able to sit safely and securely 2 minutes Stand to Sit: Sits safely with minimal use of hands Transfers: Able to transfer with verbal cueing and /or supervision Standing Unsupported with Eyes Closed: Able to stand 10 seconds with supervision Standing Ubsupported with Feet Together: Able to place feet together independently and stand for 1 minute with supervision From Standing, Reach Forward with Outstretched Arm: Can reach forward >12 cm safely (5") From Standing Position, Pick up Object from Floor: Unable to try/needs assist to keep balance (hip precautions) From Standing Position, Turn to Look Behind Over each Shoulder: Looks behind from both sides and weight shifts well Turn 360 Degrees: Able to turn 360 degrees safely one side only in 4 seconds or less Standing Unsupported, Alternately Place Feet on Step/Stool: Able to stand independently and complete 8 steps >20 seconds Standing Unsupported, One Foot in Front: Able to plae foot ahead of the other independently and hold 30 seconds Standing on One Leg: Able to lift leg independently and hold equal to or more than 3 seconds Total Score: 41 Timed Up and Go Test TUG: Normal TUG Normal TUG (seconds): 14   See Function Navigator for Current Functional Status.   Therapy/Group: Individual Therapy  Laretta Alstrom 03/24/2015, 4:50 PM

## 2015-03-24 NOTE — Progress Notes (Signed)
Subjective/Complaints: Less dizzy yesterday. Had drop in SBP to 77 yesterday while standing. Voiding unchanged    ROS: Pt denies fever, rash/itching, headache, blurred or double vision, nausea, vomiting, abdominal pain, diarrhea, chest pain, shortness of breath, palpitations, dysuria, neck or back pain, bleeding, anxiety, or depression   Objective: Vital Signs: Blood pressure 145/82, pulse 86, temperature 98.2 F (36.8 C), temperature source Oral, resp. rate 18, height 5\' 9"  (1.753 m), weight 64.864 kg (143 lb), SpO2 97 %. No results found. Results for orders placed or performed during the hospital encounter of 03/18/15 (from the past 72 hour(s))  Culture, Urine     Status: None   Collection Time: 03/21/15 10:36 AM  Result Value Ref Range   Specimen Description URINE, CLEAN CATCH    Special Requests NONE    Culture      50,000 COLONIES/mL STAPHYLOCOCCUS SPECIES (COAGULASE NEGATIVE)   Report Status 03/23/2015 FINAL    Organism ID, Bacteria STAPHYLOCOCCUS SPECIES (COAGULASE NEGATIVE)       Susceptibility   Staphylococcus species (coagulase negative) - MIC*    CIPROFLOXACIN >=8 RESISTANT Resistant     GENTAMICIN >=16 RESISTANT Resistant     NITROFURANTOIN <=16 SENSITIVE Sensitive     OXACILLIN >=4 RESISTANT Resistant     TETRACYCLINE <=1 SENSITIVE Sensitive     VANCOMYCIN 2 SENSITIVE Sensitive     TRIMETH/SULFA >=320 RESISTANT Resistant     CLINDAMYCIN >=8 RESISTANT Resistant     RIFAMPIN <=0.5 SENSITIVE Sensitive     Inducible Clindamycin NEGATIVE Sensitive     * 50,000 COLONIES/mL STAPHYLOCOCCUS SPECIES (COAGULASE NEGATIVE)  Basic metabolic panel     Status: Abnormal   Collection Time: 03/23/15  7:13 AM  Result Value Ref Range   Sodium 140 135 - 145 mmol/L   Potassium 4.0 3.5 - 5.1 mmol/L   Chloride 102 101 - 111 mmol/L   CO2 30 22 - 32 mmol/L   Glucose, Bld 96 65 - 99 mg/dL   BUN 29 (H) 6 - 20 mg/dL   Creatinine, Ser 05/21/15 (H) 0.61 - 1.24 mg/dL   Calcium 8.7 (L) 8.9 -  10.3 mg/dL   GFR calc non Af Amer 47 (L) >60 mL/min   GFR calc Af Amer 55 (L) >60 mL/min    Comment: (NOTE) The eGFR has been calculated using the CKD EPI equation. This calculation has not been validated in all clinical situations. eGFR's persistently <60 mL/min signify possible Chronic Kidney Disease.    Anion gap 8 5 - 15  Basic metabolic panel     Status: Abnormal   Collection Time: 03/24/15  6:07 AM  Result Value Ref Range   Sodium 140 135 - 145 mmol/L   Potassium 3.8 3.5 - 5.1 mmol/L   Chloride 101 101 - 111 mmol/L   CO2 29 22 - 32 mmol/L   Glucose, Bld 86 65 - 99 mg/dL   BUN 29 (H) 6 - 20 mg/dL   Creatinine, Ser 05/22/15 (H) 0.61 - 1.24 mg/dL   Calcium 8.7 (L) 8.9 - 10.3 mg/dL   GFR calc non Af Amer 53 (L) >60 mL/min   GFR calc Af Amer >60 >60 mL/min    Comment: (NOTE) The eGFR has been calculated using the CKD EPI equation. This calculation has not been validated in all clinical situations. eGFR's persistently <60 mL/min signify possible Chronic Kidney Disease.    Anion gap 10 5 - 15        Assessment/Plan: 1. Functional deficits secondary to debility from myelofibrosis,  chemo induced polyneuropathy and hip dislocation s/p hip fx which require 3+ hours per day of interdisciplinary therapy in a comprehensive inpatient rehab setting. Physiatrist is providing close team supervision and 24 hour management of active medical problems listed below. Physiatrist and rehab team continue to assess barriers to discharge/monitor patient progress toward functional and medical goals. FIM: Function - Bathing Bathing activity did not occur:  (declined shower 03/21/15) Position: Shower Body parts bathed by patient: Right arm, Left arm, Chest, Abdomen, Front perineal area, Buttocks, Right upper leg, Left upper leg, Right lower leg, Left lower leg Assist Level: Assistive device, Touching or steadying assistance(Pt > 75%) Assistive Device Comment: long handled sponge  Function- Upper Body  Dressing/Undressing What is the patient wearing?: Pull over shirt/dress Pull over shirt/dress - Perfomed by patient: Thread/unthread right sleeve, Thread/unthread left sleeve, Put head through opening, Pull shirt over trunk Assist Level: Supervision or verbal cues Function - Lower Body Dressing/Undressing What is the patient wearing?: Pants Position: Sitting EOB Underwear - Performed by patient: Thread/unthread right underwear leg, Thread/unthread left underwear leg, Pull underwear up/down Pants- Performed by patient: Pull pants up/down, Thread/unthread right pants leg, Thread/unthread left pants leg Non-skid slipper socks- Performed by helper: Don/doff right sock, Don/doff left sock Shoes - Performed by patient: Don/doff right shoe, Don/doff left shoe, Fasten right, Fasten left TED Hose - Performed by helper: Don/doff right TED hose, Don/doff left TED hose Assist for lower body dressing: Touching or steadying assistance (Pt > 75%) Assistive Device Comment: reacher  Function - Toileting Toileting steps completed by patient: Adjust clothing prior to toileting, Adjust clothing after toileting, Performs perineal hygiene Toileting steps completed by helper: Adjust clothing prior to toileting, Performs perineal hygiene, Adjust clothing after toileting Toileting Assistive Devices: Grab bar or rail Assist level: Supervision or verbal cues  Function - Air cabin crew transfer assistive device: Grab bar, Walker Assist level to toilet: Supervision or verbal cues Assist level from toilet: Supervision or verbal cues  Function - Chair/bed transfer Chair/bed transfer method: Stand pivot, Ambulatory Chair/bed transfer assist level: Supervision or verbal cues Chair/bed transfer assistive device: Armrests, Walker Chair/bed transfer details: Verbal cues for precautions/safety, Verbal cues for safe use of DME/AE  Function - Locomotion: Wheelchair Will patient use wheelchair at discharge?: Yes  (due to orthostasis) Type: Manual Max wheelchair distance: 150 Assist Level: Supervision or verbal cues Assist Level: Supervision or verbal cues Assist Level: Supervision or verbal cues Turns around,maneuvers to table,bed, and toilet,negotiates 3% grade,maneuvers on rugs and over doorsills: No Function - Locomotion: Ambulation Assistive device: Walker-rolling Max distance: 300 Assist level: Supervision or verbal cues Assist level: Supervision or verbal cues Assist level: Supervision or verbal cues Walk 150 feet activity did not occur: Safety/medical concerns (fatigue) Assist level: Supervision or verbal cues Assist level: Touching or steadying assistance (Pt > 75%)  Function - Comprehension Comprehension: Auditory Comprehension assist level: Follows complex conversation/direction with no assist  Function - Expression Expression: Verbal Expression assist level: Expresses complex ideas: With no assist  Function - Social Interaction Social Interaction assist level: Interacts appropriately with others - No medications needed.  Function - Problem Solving Problem solving assist level: Solves complex problems: Recognizes & self-corrects  Function - Memory Memory assist level: Complete Independence: No helper Patient normally able to recall (first 3 days only): Current season, Location of own room, Staff names and faces, That he or she is in a hospital  Medical Problem List and Plan: 1.  Weakness/neuropathy, debilitation secondary to myelofibrosis and treatment. Have contacted  Dr. Julien Nordmann oncology services regarding patient's admission to inpatient rehab.             -continue cir therapies. Target 2/17 dc if medically ready 2.  DVT Prophylaxis/Anticoagulation: SCDs. Monitor for any signs of DVT. OOB/ambulation 3. Pain Management: Tylenol as needed 4. Hypertension/orthostasis: Norvasc DECREASED to '5mg'$  at dinner time   -abd binder/TEDS/transitional measures when going from sit to  stand  -I personally reviewed the patient's labs today. Still slightly dry but no worse. Continue to encourage fluids  -florinef added to better sustain bp 5. Neuropsych: This patient is capable of making decisions on his own behalf. 6. Skin/Wound Care: Routine skin checks             -continue topical rx  -skin still somewhat dry 7. Fluids/Electrolytes/Nutrition: encouraging po 8. History of hip fracture with recurrent dislocation. Hip brace at all times with hip precautions.(has another month in brace) Weightbearing as tolerated. 9. GERD. 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 Constipation: sorbitol on admit. Suppository if needed             -daily senokot-s 13.  BPH/Urinary freq: some improvement  - UA neg, ucx with 50k coag neg staph S to macrodantin---will give him 3 days worth (until discharge) given sx  -decreased enablex to 7.'5mg'$   -still holding flomax due to orthostasis  -pvr's now less than 100.   -encouraged OOB/EOB to void  LOS (Days) 6 A FACE TO FACE EVALUATION WAS PERFORMED  SWARTZ,ZACHARY T 03/24/2015, 8:17 AM

## 2015-03-24 NOTE — Progress Notes (Signed)
Physical Therapy Session Note  Patient Details  Name: [name] MRN: [id number] Date of Birth: April 30, 1945  Today's Date: 03/24/2015  Concurrent tx: 1540-1610, 30 min   -    Short Term Goals: Week 1:  PT Short Term Goal 1 (Week 1): = LTGs due to anticipated LOS    Skilled Therapeutic Interventions/Progress Updates:   Reviewed hip precautions; pt able to state all and observe functionally.   In sitting in w/c, pt performed 10 x 1 long arc quad knee ext with ankle pumps x 5 at end range; 20 x 1  calf raises.  Gait with RW on level tile, carpet, side stepping to L with RW, supervision.  Up/down 12 steps.  Pt ambulated back to room; transferred to bed ; bed alarm set and left with all needs within reach.    Therapy Documentation Precautions:  Precautions Precautions: Posterior Hip, Fall Required Braces or Orthoses: Other Brace/Splint Other Brace/Splint: LLE brace at all times Restrictions Weight Bearing Restrictions: No LLE Weight Bearing: Weight bearing as tolerated Other Position/Activity Restrictions: LLE brace at all times to prevent hip dislocation      See Function Navigator for Current Functional Status.   Therapy/Group: Group Therapy  Dafne Nield 03/24/2015, 4:30 PM

## 2015-03-25 ENCOUNTER — Inpatient Hospital Stay (HOSPITAL_COMMUNITY): Payer: Medicare Other

## 2015-03-25 MED ORDER — VALACYCLOVIR HCL 500 MG PO TABS: 500.0000 mg | ORAL_TABLET | Freq: Every day | ORAL | Status: AC

## 2015-03-25 MED ORDER — FLUCONAZOLE 200 MG PO TABS: 400.0000 mg | ORAL_TABLET | Freq: Every day | ORAL | Status: DC

## 2015-03-25 MED ORDER — DARIFENACIN HYDROBROMIDE ER 7.5 MG PO TB24: 7.5000 mg | ORAL_TABLET | Freq: Every day | ORAL | Status: DC

## 2015-03-25 MED ORDER — PREDNISONE 10 MG PO TABS: 10.0000 mg | ORAL_TABLET | Freq: Every day | ORAL | Status: DC

## 2015-03-25 MED ORDER — FAMOTIDINE 20 MG PO TABS: 20.0000 mg | ORAL_TABLET | Freq: Every day | ORAL | Status: DC

## 2015-03-25 MED ORDER — TACROLIMUS 0.5 MG PO CAPS: 0.5000 mg | ORAL_CAPSULE | Freq: Two times a day (BID) | ORAL | Status: DC

## 2015-03-25 MED ORDER — FLUDROCORTISONE ACETATE 0.1 MG PO TABS: 0.1000 mg | ORAL_TABLET | Freq: Every day | ORAL | Status: DC

## 2015-03-25 NOTE — Discharge Instructions (Signed)
Inpatient Rehab Discharge Instructions  Thomas Cherry [facility] Discharge date and time: No discharge date for patient encounter.   Activities/Precautions/ Functional Status: Activity: activity as tolerated Diet: regular diet Wound Care: none needed Functional status:  ___ No restrictions     ___ Walk up steps independently ___ 24/7 supervision/assistance   ___ Walk up steps with assistance ___ Intermittent supervision/assistance  ___ Bathe/dress independently ___ Walk with walker     _x__ Bathe/dress with assistance ___ Walk Independently    ___ Shower independently _x__ Walk with assistance    ___ Shower with assistance ___ No alcohol     ___ Return to work/school ________    COMMUNITY REFERRALS UPON DISCHARGE:    Home Health:   PT      RN                      Agency:  [facility] Phone: [phone]  Medical Equipment/Items Ordered: wheelchair, cushion                                                     Agency/Supplier:  [employer] @ [id number]    Special Instructions: Follow-up with Dr. Gwyndolyn Saxon. Oncology services [place], [place]   Continue brace with posterior precautions weightbearing as tolerated   My questions have been answered and I understand these instructions. I will adhere to these goals and the provided educational materials after my discharge from the hospital.  Patient/Caregiver Signature _______________________________ Date __________  Clinician Signature _______________________________________ Date __________  Please bring this form and your medication list with you to all your follow-up doctor's appointments.

## 2015-03-25 NOTE — Plan of Care (Signed)
Problem: RH Furniture Transfers Goal: LTG Patient will perform furniture transfers w/assist (OT/PT LTG: Patient will perform furniture transfers with assistance (OT/PT).  Outcome: Adequate for Discharge Goals were downgraded to supervision due to orthostasis.

## 2015-03-25 NOTE — Progress Notes (Signed)
Subjective/Complaints: States he felt better yesterday. bp low again today upon standing. (73/47)     ROS: Pt denies fever, rash/itching, headache, blurred or double vision, nausea, vomiting, abdominal pain, diarrhea, chest pain, shortness of breath, palpitations, dysuria, neck or back pain, bleeding, anxiety, or depression   Objective: Vital Signs: Blood pressure 144/67, pulse 86, temperature 97.9 F (36.6 C), temperature source Oral, resp. rate 17, height _0  (1.753 m), weight 64.864 kg (143 lb), SpO2 98 %. No results found. Results for orders placed or performed during the hospital encounter of 03/18/15 (from the past 72 hour(s))  Basic metabolic panel     Status: Abnormal   Collection Time: 03/23/15  7:13 AM  Result Value Ref Range   Sodium 140 135 - 145 mmol/L   Potassium 4.0 3.5 - 5.1 mmol/L   Chloride 102 101 - 111 mmol/L   CO2 30 22 - 32 mmol/L   Glucose, Bld 96 65 - 99 mg/dL   BUN 29 (H) 6 - 20 mg/dL   Creatinine, Ser 1.45 (H) 0.61 - 1.24 mg/dL   Calcium 8.7 (L) 8.9 - 10.3 mg/dL   GFR calc non Af Amer 47 (L) >60 mL/min   GFR calc Af Amer 55 (L) >60 mL/min    Comment: (NOTE) The eGFR has been calculated using the CKD EPI equation. This calculation has not been validated in all clinical situations. eGFR's persistently <60 mL/min signify possible Chronic Kidney Disease.    Anion gap 8 5 - 15  Basic metabolic panel     Status: Abnormal   Collection Time: 03/24/15  6:07 AM  Result Value Ref Range   Sodium 140 135 - 145 mmol/L   Potassium 3.8 3.5 - 5.1 mmol/L   Chloride 101 101 - 111 mmol/L   CO2 29 22 - 32 mmol/L   Glucose, Bld 86 65 - 99 mg/dL   BUN 29 (H) 6 - 20 mg/dL   Creatinine, Ser 1.33 (H) 0.61 - 1.24 mg/dL   Calcium 8.7 (L) 8.9 - 10.3 mg/dL   GFR calc non Af Amer 53 (L) >60 mL/min   GFR calc Af Amer >60 >60 mL/min    Comment: (NOTE) The eGFR has been calculated using the CKD EPI equation. This calculation has not been validated in all clinical  situations. eGFR's persistently <60 mL/min signify possible Chronic Kidney Disease.    Anion gap 10 5 - 15        Assessment/Plan: 1. 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 Physiatrist is providing close team supervision and 24 hour management of active medical problems listed below. Physiatrist and rehab team continue to assess barriers to discharge/monitor patient progress toward functional and medical goals. FIM: Function - Bathing Bathing activity did not occur:  (declined shower 03/21/15) Position: Wheelchair/chair at sink Body parts bathed by patient: Right arm, Left arm, Chest, Abdomen, Front perineal area, Buttocks, Right upper leg, Left upper leg, Right lower leg, Left lower leg Assist Level: Assistive device, Supervision or verbal cues Assistive Device Comment: long handled sponge  Function- Upper Body Dressing/Undressing What is the patient wearing?: Pull over shirt/dress, Button up shirt Pull over shirt/dress - Perfomed by patient: Thread/unthread right sleeve, Thread/unthread left sleeve, Put head through opening, Pull shirt over trunk Button up shirt - Perfomed by patient: Thread/unthread right sleeve, Thread/unthread left sleeve, Pull shirt around back, Button/unbutton shirt  Assist Level: Supervision or verbal cues Function - Lower Body Dressing/Undressing What is the patient wearing?: Underwear, Pants, Shoes, Ted Hose (LLE brace) Position: Wheelchair/chair at sink Underwear - Performed by patient: Thread/unthread right underwear leg, Thread/unthread left underwear leg, Pull underwear up/down Pants- Performed by patient: Pull pants up/down, Thread/unthread right pants leg, Thread/unthread left pants leg Non-skid slipper socks- Performed by helper: Don/doff right sock, Don/doff left  sock Shoes - Performed by patient: Don/doff right shoe, Don/doff left shoe, Fasten right, Fasten left TED Hose - Performed by helper: Don/doff right TED hose, Don/doff left TED hose Assist for lower body dressing: Touching or steadying assistance (Pt > 75%) Assistive Device Comment: reacher  Function - Toileting Toileting steps completed by patient: Adjust clothing prior to toileting, Adjust clothing after toileting, Performs perineal hygiene Toileting steps completed by helper: Adjust clothing prior to toileting, Performs perineal hygiene, Adjust clothing after toileting Toileting Assistive Devices: Grab bar or rail Assist level: Supervision or verbal cues  Function - Air cabin crew transfer assistive device: Grab bar, Pension scheme manager level to toilet: Supervision or verbal cues Assist level from toilet: Supervision or verbal cues  Function - Chair/bed transfer Chair/bed transfer method: Stand pivot, Ambulatory Chair/bed transfer assist level: Supervision or verbal cues Chair/bed transfer assistive device: Armrests, Walker Chair/bed transfer details: Verbal cues for precautions/safety, Verbal cues for safe use of DME/AE  Function - Locomotion: Wheelchair Will patient use wheelchair at discharge?: Yes (due to orthostasis) Type: Manual Max wheelchair distance: 150 Assist Level: Supervision or verbal cues Assist Level: Supervision or verbal cues Assist Level: Supervision or verbal cues Turns around,maneuvers to table,bed, and toilet,negotiates 3% grade,maneuvers on rugs and over doorsills: No Function - Locomotion: Ambulation Assistive device: Walker-rolling Max distance: 150 Assist level: Supervision or verbal cues Assist level: Supervision or verbal cues Assist level: Supervision or verbal cues Walk 150 feet activity did not occur: Safety/medical concerns (fatigue) Assist level: Supervision or verbal cues Assist level: Touching or steadying assistance (Pt >  75%)  Function - Comprehension Comprehension: Auditory Comprehension assist level: Understands complex 90% of the time/cues 10% of the time  Function - Expression Expression: Verbal Expression assist level: Expresses complex 90% of the time/cues < 10% of the time  Function - Social Interaction Social Interaction assist level: Interacts appropriately with others - No medications needed.  Function - Problem Solving Problem solving assist level: Solves basic 90% of the time/requires cueing < 10% of the time  Function - Memory Memory assist level: Recognizes or recalls 90% of the time/requires cueing < 10% of the time Patient normally able to recall (first 3 days only): Current season, Location of own room, Staff names and faces, That he or she is in a hospital  Medical Problem List and Plan: 1.  Weakness/neuropathy, debilitation secondary to myelofibrosis and treatment. Have contacted Dr. Julien Nordmann oncology services regarding patient's admission to inpatient rehab.             -continue to work toward dc tomorrow 2.  DVT Prophylaxis/Anticoagulation: SCDs. Monitor for any signs of DVT. OOB/ambulation 3. Pain Management: Tylenol as needed 4. Hypertension/orthostasis: Norvasc 71m---stop---resume at home as indicated   -abd binder/TEDS/transitional measures when going from sit to stand  -continue to push fluids  -florinef added to better sustain bp  -will make referral to cardiology for continued orthostatic hypotension (chronic issue) 5. Neuropsych: This patient is capable of making decisions on his own behalf. 6. Skin/Wound Care: Routine skin checks             -  continue topical rx  -skin still somewhat dry 7. Fluids/Electrolytes/Nutrition: encouraging po 8. History of hip fracture with recurrent dislocation. Hip brace at all times with hip precautions.(has another month in brace) Weightbearing as tolerated. 9. GERD. 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  Constipation: sorbitol on admit. Suppository if needed             -daily senokot-s 13.  BPH/Urinary freq: some improvement  - ucx with 50k coag neg staph S to macrodantin---3 day course  -decreased enablex to 7.54m  -still holding flomax due to orthostasis  -pvr's are now around 100 or less.   -encouraged OOB/EOB to void  LOS (Days) 7 A FACE TO FACE EVALUATION WAS PERFORMED  Thomas Cherry T 03/25/2015, 8:54 AM

## 2015-03-25 NOTE — Discharge Summary (Signed)
NAMEKUNGA, MESIAS               ACCOUNT NO.:  [id number]  MEDICAL RECORD NO.:  [id number]  LOCATION:  4M06C                        FACILITY:  [facility]  PHYSICIAN:  Lauraine Rinne, P.A.  DATE OF BIRTH:  [birth year]  DATE OF ADMISSION:  03/18/2015 DATE OF DISCHARGE:                              DISCHARGE SUMMARY   DISCHARGE DIAGNOSES: 1. Weakness, neuropathy, and debilitation secondary to myelofibrosis     and treatment. 2. SCDs for deep vein thrombosis prophylaxis. 3. Hypertension. 4. History of hip fracture with recurrent dislocation. 5. Gastroesophageal reflux disease. 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 7. Constipation. 8. Benign prostatic hypertrophy.  HISTORY OF PRESENT ILLNESS:  This is a 70 year old right-handed male, history of hypertension, recent hip fracture in November 2016 secondary to osteoporosis, complicated by dislocation with brace applied, posterior hip precautions, severe polyneuropathy felt to be secondary to dapsone and tacrolimus that were adjusted and recent EMG completed, myelofibrosis status post bone marrow transplant in 2015, followed by Dr. Virgina Norfolk at [facility], lives with his wife in [place], independent with a rolling walker, presented March 14, 2015 to [facility] with acute weakness, inability to walk or stand.  He recently completed a course of antibiotics for pneumonia, noted recent fall sustaining abrasion to the right forearm.  Admission labs unremarkable except mild hypokalemia, acute on chronic anemia 8.5.  MRI of brain and cervical spine unremarkable.  Bouts of orthostasis, he did require Florinef and recent adjustments in antihypertensive medication with Norvasc held. Plans to arrange outpatient cardiology follow-up orthostasis. Physical and occupational therapy ongoing.  The patient was admitted for comprehensive rehab program.  PAST MEDICAL HISTORY:  See discharge diagnoses.  SOCIAL HISTORY:  Lives with wife at 1 level home  with a ramp. Functional status upon admission to rehab service was min to mod assist functional mobility.  PHYSICAL EXAMINATION:  VITAL SIGNS:  Blood pressure 102/60 pulse 74, respirations 18.  Temperature 98. GENERAL:  This was an alert male, oriented x3. LUNGS:  Clear to auscultation without wheeze. CARDIAC:  Regular rate and rhythm without murmur. ABDOMEN:  Soft, nontender.  Good bowel sounds.  Left hip brace in place, appears to be fitting appropriately.  REHABILITATION HOSPITAL COURSE:  The patient was admitted to inpatient rehab services with therapies initiated on a 3-hour daily basis, consisting of physical therapy, occupational therapy, and rehabilitation nursing.  The following issues were addressed during the patient's rehabilitation stay.  Pertaining to Mr. Deloza myelofibrosis, complicated by weakness neuropathy.  He would follow up with Dr. Earlie Server of Oncology Services.  SCDs for DVT prophylaxis.  Close monitoring of blood pressure.  He remained on low-dose Florinef to better sustain blood pressure.  Hip brace in place with posterior hip precautions.  Weightbearing as tolerated.  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  Constipation resolved with laxative assistance.  Noted BPH, urinary frequency.  Urine study negative.  Culture 50,000 coag-negative sensitive to Macrodantin.  He received 3 days of antibiotics.  No Flomax.  Due to orthostasis and monitored.  His Enablex was decreased to 7.5 mg.  The patient received weekly collaborative interdisciplinary team conferences to discuss estimated length of stay, family teaching, and  any barriers to his discharge.  Ambulate with a rolling walker on level tile carpet, sidestepping to left with rolling walker, supervision up and down 12 steps.  Ambulate back to his room, transfer to his bed with a hip brace as advised.  Functional mobility and endurance are greatly improved. Gather belongings  for activities of daily living and homemaking with improved endurance, functional mobility, dynamic standing balance. Safety awareness and memory.  He was able to recall posterior hip precautions.  Full family teaching was completed and plan discharge to home.  DISCHARGE MEDICATIONS: 1. Vitamin D 1000 units p.o. daily. 2. Temovate topical 0.05% 2 times daily to affected areas. 3. Enablex 7.5 mg p.o. daily. 4. DesOwen 0.05% twice daily to affected areas. 5. Pepcid 20 mg p.o. daily. 6. Completing a 10-day course of Diflucan 400 mg p.o. daily. 7. Florinef 0.1 mg p.o. daily. 8. MiraLAX daily, hold for loose stool. 9.Prednisone 10 mg p.o. daily. 10.Prograf 0.5 mg p.o. b.i.d. and 1 mg p.o. b.i.d. 11.Valtrex 500 mg p.o. daily.  DIET:  Regular.  FOLLOWUP:  He would follow up with Dr. Curt Bears, call for appointment with Dr. Alger Simons as needed, [name], medical Management.Appointment to be made with cardiology in regards to orthostasis.  Ongoing therapies arranged as per rehab services.     Lauraine Rinne, P.A.     DA/MEDQ  D:  03/25/2015  T:  03/25/2015  Job:  [id number]  cc:   Hilliard Clark A. Loanne Drilling, MD Eilleen Kempf, M.D. Alroy Dust T Naaman Plummer

## 2015-03-25 NOTE — Progress Notes (Signed)
Occupational Therapy Note  Patient Details  Name: Thomas Cherry MRN: [id number] Date of Birth: 10-19-1945  Today's Date: 03/25/2015 OT Individual Time: 1300-1400 OT Individual Time Calculation (min): 60 min   Pt denied pain Individual Therapy  Pt resting in bed upon arrival.  Pt initially engaged in changing pants before amb with RW to ADL apartment and practiced walk in shower transfer to simulate home environment.  Recommended that his wife be present when bathing in shower.  Pt verbalized understanding.  Pt also engaged in dynamic standing tasks and functional amb with RW to simulate home mgmt tasks.  Pt noted with dizziness X 1 during session.  BP: seated-109/60; standing with Ted hose and abd binder-85/43. Pt returned to room and elected to sit in recliner with all needs within reach.   Leotis Shames [facility] 03/25/2015, 3:23 PM

## 2015-03-25 NOTE — Progress Notes (Signed)
Occupational Therapy Discharge Summary  Patient Details  Name: Thomas Cherry MRN: [id number] Date of Birth: 10-May-1945  Patient has met 7 of 7 long term goals due to improved activity tolerance, improved balance, postural control and ability to compensate for deficits.  Pt made steady progress with BADLs during this admission and is supervision for all tasks.  Pt continues to exhibit decreased BP when standing with Ted hose and abd binder donned.  Pt verbalized understanding that hose and binder should be worn when OOB.  Pt educated on energy conversation and safety awareness when feeling fatigues/dizzy. Family has not been present for family educaiton during [place]. Patient to discharge at overall Supervision level.  Patient's care partner is independent to provide the necessary physical assistance at discharge.     Recommendation:  Patient will benefit from ongoing skilled OT services in home health setting to continue to advance functional skills in the area of BADL, iADL and Reduce care partner burden.  Equipment: No equipment providedPt [facility] uses [facility] shower  Reasons for discharge: treatment goals met and discharge from hospital  Patient/family agrees with progress made and goals achieved: Yes  OT Discharge Precautions/Restrictions  Precautions Precautions: Posterior Hip;Fall Required Braces or Orthoses: Other Brace/Splint Other Brace/Splint: LLE brace at all times Restrictions Weight Bearing Restrictions: No LLE Weight Bearing: Weight bearing as tolerated Vision/Perception  Vision- History Baseline Vision/History: No visual deficits Patient Visual Report: No change from baseline Vision- Assessment Vision Assessment?: No apparent visual deficits  Cognition Overall Cognitive Status: Within Functional Limits for tasks assessed Arousal/Alertness: Awake/alert Orientation Level: Oriented X4 Memory: Appears intact Awareness: Appears intact Problem Solving: Appears  intact Safety/Judgment: Appears intact Sensation Sensation Light Touch: Impaired Detail Light Touch Impaired Details: Impaired LLE;Impaired RLE Stereognosis: Appears Intact Hot/Cold: Appears Intact Proprioception: Appears Intact Coordination Gross Motor Movements are Fluid and Coordinated: Yes Motor  Motor Motor: Within Functional Limits    Trunk/Postural Assessment  Cervical Assessment Cervical Assessment: Within Functional Limits Thoracic Assessment Thoracic Assessment: Exceptions to [facility] (fixed posture) Lumbar Assessment Lumbar Assessment: Exceptions to [facility] (posterior pelvic tilt) Postural Control Postural Control: Within Functional Limits  Balance Balance Balance Assessed: Yes Static Standing Balance Static Standing - Level of Assistance: 5: Stand by assistance Dynamic Standing Balance Dynamic Standing - Level of Assistance: 5: Stand by assistance Extremity/Trunk Assessment RUE Assessment RUE Assessment: Within Functional Limits LUE Assessment LUE Assessment: Within Functional Limits   See Function Navigator for Current Functional Status.  Leotis Shames [facility] 03/25/2015, 3:36 PM

## 2015-03-25 NOTE — Progress Notes (Signed)
Occupational Therapy Session Note  Patient Details  Name: Thomas Cherry MRN: [id number] Date of Birth: 01-08-1946  Today's Date: 03/25/2015 OT Individual Time: 0830-1000 OT Individual Time Calculation (min): 90 min    Short Term Goals: Week 1:  OT Short Term Goal 1 (Week 1): STGs = LTGs due to short LOS  Skilled Therapeutic Interventions/Progress Updates:    Pt engaged in BADL retaining including bathing at shower level, dressing with sit<>stand from EOB, and toileting.  Pt completed all tasks with supervision.  Pt did not report any episodes of dizziness although pt requested rest breaks X 4 during session.  Pt also engaged in functional amb with RW activities to increase independence with home mgmt tasks and activity tolerance.  Pt required more than a reasonable amount of time to complete tasks with multiple rest breaks.  Pt stated that he has difficulty differentiating between fatigue and dizziness.  Family has not been present for OT sessions. BP: seated-114/66; standing with Ted hose and abd binder-84/55 with s/s of dizziness. Focus on activity tolerance, sit<>stand, standing balance, functinal amb with RW, and safety awareness.  Therapy Documentation Precautions:  Precautions Precautions: Posterior Hip, Fall Required Braces or Orthoses: Other Brace/Splint Other Brace/Splint: LLE brace at all times Restrictions Weight Bearing Restrictions: No LLE Weight Bearing: Weight bearing as tolerated Other Position/Activity Restrictions: LLE brace at all times to prevent hip dislocation  Pain:  Pt denied pain ADL: ADL ADL Comments: refer to functional navigator  See Function Navigator for Current Functional Status.   Therapy/Group: Individual Therapy  Leroy Libman 03/25/2015, 12:06 PM

## 2015-03-25 NOTE — Discharge Summary (Signed)
Discharge summary job [id number]

## 2015-03-25 NOTE — Progress Notes (Signed)
Physical Therapy Discharge Summary  Patient Details  Name: Thomas Cherry MRN: [id number] Date of Birth: [birth year]  Today's Date: 03/25/2015 PT Individual Time: 1400-1500 PT Individual Time Calculation (min): 60 min  Individual treatment focused on grad day activities and performing d/c assessment including bed mobility, transfers, gait with RW, simulated car transfer, gait over uneven surfaces, stair negotiation, and w/c mobility. See below for details. Pt performed all mobility at overall supervision level for safety due to orthostasis (see Doc FLow sheets for details on BP values). PT adjusted pt's personal w/c (lengthening legrests) for proper fit. Educated on energy conservation, BP monitoring, and home set-up modifications. Engaged at end of session in balance training without UE for basketball game and then on compliant surface at overall close supervision level. Biodex training for limits of stability for weighshifting and balance training.   Patient has met 8 of 9 long term goals due to improved activity tolerance, improved balance and ability to compensate for deficits.  Patient to discharge at an ambulatory level Supervision due to orthostasis. Patient's care partner is independent to provide the necessary supervision assistance at discharge.  Reasons goals not met: Furniture transfer goal was at modified independent but pt is supervision for transfers due to orthostasis.  Recommendation:  Patient will benefit from ongoing skilled PT services in home health setting to continue to advance safe functional mobility, address ongoing impairments in gait, balance, strength, endurance, orthostasis, functional mobility, and minimize fall risk.  Equipment: 18x18 w/c with basic cushion. Pt already owns RW.  Reasons for discharge: treatment goals met and discharge from hospital  Patient/family agrees with progress made and goals achieved: Yes  PT  Discharge Precautions/Restrictions Precautions Precautions: Posterior Hip;Fall Required Braces or Orthoses: Other Brace/Splint Other Brace/Splint: LLE brace at all times Restrictions Weight Bearing Restrictions: No LLE Weight Bearing: Weight bearing as tolerated  Cognition Overall Cognitive Status: Within Functional Limits for tasks assessed Arousal/Alertness: Awake/alert Orientation Level: Oriented X4 Memory: Appears intact Awareness: Appears intact Problem Solving: Appears intact Safety/Judgment: Appears intact Sensation Sensation Light Touch: Impaired Detail Light Touch Impaired Details: Impaired LLE;Impaired RLE Stereognosis: Appears Intact Hot/Cold: Appears Intact Proprioception: Appears Intact Coordination Gross Motor Movements are Fluid and Coordinated: Yes Motor  Motor Motor: Within Functional Limits     Trunk/Postural Assessment  Cervical Assessment Cervical Assessment: Within Functional Limits Thoracic Assessment Thoracic Assessment: Exceptions to [facility] (fixed posture) Lumbar Assessment Lumbar Assessment: Exceptions to [facility] (posterior pelvic tilt) Postural Control Postural Control: Within Functional Limits  Balance Balance Balance Assessed: Yes Static Standing Balance Static Standing - Level of Assistance: 5: Stand by assistance Dynamic Standing Balance Dynamic Standing - Level of Assistance: 5: Stand by assistance Extremity Assessment  RUE Assessment RUE Assessment: Within Functional Limits LUE Assessment LUE Assessment: Within Functional Limits RLE Assessment RLE Assessment: Within Functional Limits LLE Strength LLE Overall Strength Comments: hip flexion NT due to precautions; knee flexion/extension and ankle [facility]   See Function Navigator for Current Functional Status.  Canary Brim Ivory Broad, PT, DPT  03/25/2015, 4:07 PM

## 2015-03-26 DIAGNOSIS — N390 Urinary tract infection, site not specified: Secondary | ICD-10-CM | POA: Insufficient documentation

## 2015-03-26 DIAGNOSIS — I951 Orthostatic hypotension: Secondary | ICD-10-CM

## 2015-03-26 NOTE — Progress Notes (Signed)
Subjective/Complaints: Patient sitting comfortably in bed this morning. He had a good night. He is looking forward to being discharged today.    ROS: Denies CP, SOB, N/V/D.   Objective: Vital Signs: Blood pressure 82/57, pulse 101, temperature 98.2 F (36.8 C), temperature source Oral, resp. rate 18, height '5\' 9"'$  (1.753 m), weight 64.864 kg (143 lb), SpO2 99 %. No results found. Results for orders placed or performed during the hospital encounter of 03/18/15 (from the past 72 hour(s))  Basic metabolic panel     Status: Abnormal   Collection Time: 03/24/15  6:07 AM  Result Value Ref Range   Sodium 140 135 - 145 mmol/L   Potassium 3.8 3.5 - 5.1 mmol/L   Chloride 101 101 - 111 mmol/L   CO2 29 22 - 32 mmol/L   Glucose, Bld 86 65 - 99 mg/dL   BUN 29 (H) 6 - 20 mg/dL   Creatinine, Ser 1.33 (H) 0.61 - 1.24 mg/dL   Calcium 8.7 (L) 8.9 - 10.3 mg/dL   GFR calc non Af Amer 53 (L) >60 mL/min   GFR calc Af Amer >60 >60 mL/min    Comment: (NOTE) The eGFR has been calculated using the CKD EPI equation. This calculation has not been validated in all clinical situations. eGFR's persistently <60 mL/min signify possible Chronic Kidney Disease.    Anion gap 10 5 - 15  Constitutional: Frail appearing. NAD. Vital signs reviewed. HENT: Normocephalic and atraumatic.  Eyes: Conjunctivae and EOM are normal.   Cardiovascular: Normal rate and regular rhythm. No murmur heard. Systolic click  Respiratory: Effort normal and breath sounds normal. No respiratory distress. He has no wheezes. He has no rales.  GI: Soft. Bowel sounds are normal. He exhibits no distension. There is no tenderness. There is no rebound.  Musculoskeletal: He exhibits no edema or tenderness.  Neurological: He is alert and oriented   Motor: 4+-5/5 throughout  Skin: Skin is warm and dry. He is not diaphoretic.  Psychiatric: He has a normal mood and affect. His behavior is normal. Thought content normal.    Assessment/Plan: 1.  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 Physiatrist is providing close team supervision and 24 hour management of active medical problems listed below. Physiatrist and rehab team continue to assess barriers to discharge/monitor patient progress toward functional and medical goals. FIM: Function - Bathing Bathing activity did not occur:  (declined shower 03/21/15) Position: Wheelchair/chair at sink Body parts bathed by patient: Right arm, Left arm, Chest, Abdomen, Front perineal area, Buttocks, Right upper leg, Left upper leg, Right lower leg, Left lower leg Assist Level: Assistive device, Supervision or verbal cues Assistive Device Comment: long handled sponge  Function- Upper Body Dressing/Undressing What is the patient wearing?: Pull over shirt/dress, Button up shirt Pull over shirt/dress - Perfomed by patient: Thread/unthread right sleeve, Thread/unthread left sleeve, Put head through opening, Pull shirt over trunk Button up shirt - Perfomed by patient: Thread/unthread right sleeve, Thread/unthread left sleeve, Pull shirt around back, Button/unbutton shirt Assist Level: Supervision or verbal cues Function - Lower Body Dressing/Undressing What is the patient wearing?: Underwear, Pants, Shoes, Ted Hose (LLE brace) Position: Wheelchair/chair at [employer] - Performed by patient: Thread/unthread right underwear leg, Thread/unthread left underwear leg, Pull underwear up/down Pants- Performed by patient: Pull pants up/down, Thread/unthread right pants leg, Thread/unthread left pants leg Non-skid slipper socks-  Performed by helper: Don/doff right sock, Don/doff left sock Shoes - Performed by patient: Don/doff right shoe, Don/doff left shoe, Fasten right, Fasten left TED Hose - Performed by helper: Don/doff right  TED hose, Don/doff left TED hose Assist for lower body dressing: Touching or steadying assistance (Pt > 75%) Assistive Device Comment: reacher  Function - Toileting Toileting steps completed by patient: Adjust clothing prior to toileting, Adjust clothing after toileting, Performs perineal hygiene Toileting steps completed by helper: Adjust clothing prior to toileting, Performs perineal hygiene, Adjust clothing after toileting Toileting Assistive Devices: Grab bar or rail Assist level: Supervision or verbal cues  Function - Air cabin crew transfer assistive device: Grab bar, Walker Assist level to toilet: Supervision or verbal cues Assist level from toilet: Supervision or verbal cues  Function - Chair/bed transfer Chair/bed transfer method: Ambulatory Chair/bed transfer assist level: Supervision or verbal cues Chair/bed transfer assistive device: Armrests, Environmental consultant, Orthosis Chair/bed transfer details: Verbal cues for precautions/safety, Verbal cues for safe use of DME/AE  Function - Locomotion: Wheelchair Will patient use wheelchair at discharge?: Yes (due to orthostasis) Type: Manual Max wheelchair distance: 150 Assist Level: No help, No cues, assistive device, takes more than reasonable amount of time Assist Level: No help, No cues, assistive device, takes more than reasonable amount of time Assist Level: No help, No cues, assistive device, takes more than reasonable amount of time Turns around,maneuvers to table,bed, and toilet,negotiates 3% grade,maneuvers on rugs and over doorsills: Yes Function - Locomotion: Ambulation Assistive device: Walker-rolling, Orthosis Max distance: 150 Assist level: Supervision or verbal cues Assist level: Supervision or verbal cues Assist level: Supervision or verbal cues Walk 150 feet activity did not occur: Safety/medical concerns (fatigue) Assist level: Supervision or verbal cues Assist level: Supervision or verbal cues  Function -  Comprehension Comprehension: Auditory Comprehension assist level: Understands complex 90% of the time/cues 10% of the time  Function - Expression Expression: Verbal Expression assist level: Expresses complex 90% of the time/cues < 10% of the time  Function - Social Interaction Social Interaction assist level: Interacts appropriately with others - No medications needed.  Function - Problem Solving Problem solving assist level: Solves basic 90% of the time/requires cueing < 10% of the time  Function - Memory Memory assist level: Recognizes or recalls 90% of the time/requires cueing < 10% of the time Patient normally able to recall (first 3 days only): Current season, Location of own room, Staff names and faces, That he or she is in a hospital  Medical Problem List and Plan: 1.  Weakness/neuropathy, debilitation secondary to myelofibrosis and treatment. Dr. Julien Nordmann oncology services contacted regarding patient's admission to inpatient rehab.             -DC today 2.  DVT Prophylaxis/Anticoagulation: SCDs. Monitor for any signs of DVT. OOB/ambulation 3. Pain Management: Tylenol as needed 4. Hypertension/orthostasis: Norvasc '5mg'$ --- stopped ---resume at home as indicated   -abd binder/TEDS/transitional measures when going from sit to stand  -continue to push fluids  -florinef added to better sustain bp  -will make referral to cardiology for continued orthostatic hypotension (chronic issue) 5. Neuropsych: This patient is capable of making decisions on his own behalf. 6. Skin/Wound Care: Routine skin checks             -continue topical rx  -skin still somewhat dry 7. Fluids/Electrolytes/Nutrition: encouraging po 8. History of hip fracture with recurrent dislocation. Hip brace at all times with hip precautions.(has another month in brace) Weightbearing as tolerated. 9. GERD. 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 Constipation: sorbitol on admit. Suppository  if needed             -daily senokot-s 13.  BPH/Urinary freq: some improvement  - ucx with 50k coag neg staph S.  Macrobid---3 day course to be completed on 2/12  -decreased enablex to 7.'5mg'$   -still holding flomax due to orthostasis  -encouraged OOB/EOB to void  LOS (Days) 8 A FACE TO FACE EVALUATION WAS PERFORMED  Ankit Lorie Phenix 03/26/2015, 8:58 AM

## 2015-03-26 NOTE — Progress Notes (Signed)
03/26/15  1000 nursing Patient discharged to home per wheelchair accompanied by wife and NT. Discharge instructions given by PA yesterday per patient and wife. No further questions noted. Assisted wife in setting up patients' wheelchair.

## 2015-03-28 NOTE — Progress Notes (Signed)
Social Work  Discharge Note  The overall goal for the admission was met for:   Discharge location: Yes - home with wife and family able to provide 24/7 support  Length of Stay: Yes - 8 days  Discharge activity level: Yes - supervision overall  Home/community participation: Yes  Services provided included: MD, RD, PT, OT, SLP, RN, TR, Pharmacy and [place]: Medicare and Private Insurance: [employer]  Follow-up services arranged: Home Health: PT, RN via [employer], DME: 18x18 lightweight w/c, basic cushion via [place] and Patient/Family has no preference for HH/DME agencies  Comments (or additional information):  Patient/Family verbalized understanding of follow-up arrangements: Yes  Individual responsible for coordination of the follow-up plan: pt/wife  Confirmed correct DME delivered: Victoire Deans 03/28/2015    Shereka Lafortune

## 2015-04-04 ENCOUNTER — Ambulatory Visit: Payer: Self-pay | Admitting: Surgery

## 2015-04-04 ENCOUNTER — Other Ambulatory Visit: Payer: Self-pay | Admitting: Surgery

## 2015-04-04 ENCOUNTER — Ambulatory Visit: Payer: Medicare Other | Admitting: Cardiology

## 2015-04-05 ENCOUNTER — Ambulatory Visit: Payer: Medicare Other | Admitting: Endocrinology

## 2015-04-12 NOTE — Pre-Procedure Instructions (Signed)
Thomas Cherry  04/12/2015     Your procedure is scheduled on : Friday April 15, 2015 at 10:00 AM.  Report to [facility] Admitting at 8:00 AM.  Call this number if you have problems the morning of surgery: [phone]    Remember:  Do not eat food or drink liquids after midnight.  Take these medicines the morning of surgery with A SIP OF WATER : Pantoprazole (Protonix), Prednisone, Prograf, Valacyclovir (Valtrex), Vesicare   Stop taking any aspirin, herbal medications, Ibuprofen, Advil, Motrin, Aleve, etc today   Do not wear jewelry.  Do not wear lotions, powders, or cologne.    Men may shave face and neck.  Do not bring valuables to the hospital.  [facility] is not responsible for any belongings or valuables.  Contacts, dentures or bridgework may not be worn into surgery.  Leave your suitcase in the car.  After surgery it may be brought to your room.  For patients admitted to the hospital, discharge time will be determined by your treatment team.  Patients discharged the day of surgery will not be allowed to drive home.   Name and phone number of your driver:    Special instructions:  Shower using CHG soap the night before and the morning of your surgery  Please read over the following fact sheets that you were given. Pain Booklet, Coughing and Deep Breathing and Surgical Site Infection Prevention

## 2015-04-13 ENCOUNTER — Encounter (HOSPITAL_COMMUNITY): Payer: Self-pay | Admitting: Surgery

## 2015-04-13 ENCOUNTER — Encounter (HOSPITAL_COMMUNITY)
Admission: RE | Admit: 2015-04-13 | Discharge: 2015-04-13 | Disposition: A | Discharge status: Home / Self Care | Payer: Medicare Other | Source: Ambulatory Visit | Attending: Surgery | Admitting: Surgery

## 2015-04-13 ENCOUNTER — Encounter (HOSPITAL_COMMUNITY): Payer: Self-pay

## 2015-04-13 DIAGNOSIS — C946 Myelodysplastic disease, not classified: Secondary | ICD-10-CM | POA: Diagnosis not present

## 2015-04-13 DIAGNOSIS — K219 Gastro-esophageal reflux disease without esophagitis: Secondary | ICD-10-CM | POA: Diagnosis not present

## 2015-04-13 DIAGNOSIS — I10 Essential (primary) hypertension: Secondary | ICD-10-CM | POA: Diagnosis not present

## 2015-04-13 DIAGNOSIS — Z9481 Bone marrow transplant status: Secondary | ICD-10-CM | POA: Diagnosis not present

## 2015-04-13 DIAGNOSIS — Z79899 Other long term (current) drug therapy: Secondary | ICD-10-CM | POA: Diagnosis not present

## 2015-04-13 DIAGNOSIS — K4090 Unilateral inguinal hernia, without obstruction or gangrene, not specified as recurrent: Secondary | ICD-10-CM

## 2015-04-13 DIAGNOSIS — Z7952 Long term (current) use of systemic steroids: Secondary | ICD-10-CM | POA: Diagnosis not present

## 2015-04-13 DIAGNOSIS — Z85820 Personal history of malignant melanoma of skin: Secondary | ICD-10-CM | POA: Diagnosis not present

## 2015-04-13 DIAGNOSIS — N400 Enlarged prostate without lower urinary tract symptoms: Secondary | ICD-10-CM | POA: Diagnosis not present

## 2015-04-13 HISTORY — DX: Urgency of urination: R39.15

## 2015-04-13 HISTORY — DX: Unspecified malignant neoplasm of skin, unspecified: C44.90

## 2015-04-13 HISTORY — DX: Other skin changes: R23.8

## 2015-04-13 HISTORY — DX: Thrombocytopenia, unspecified: D69.6

## 2015-04-13 HISTORY — DX: Unspecified osteoarthritis, unspecified site: M19.90

## 2015-04-13 HISTORY — DX: Spontaneous ecchymoses: R23.3

## 2015-04-13 LAB — BASIC METABOLIC PANEL WITH GFR
Anion gap: 7 (ref 5–15)
BUN: 48 mg/dL — ABNORMAL HIGH (ref 6–20)
CO2: 29 mmol/L (ref 22–32)
Calcium: 9.3 mg/dL (ref 8.9–10.3)
Chloride: 108 mmol/L (ref 101–111)
Creatinine, Ser: 1.4 mg/dL — ABNORMAL HIGH (ref 0.61–1.24)
GFR calc Af Amer: 57 mL/min — ABNORMAL LOW
GFR calc non Af Amer: 49 mL/min — ABNORMAL LOW
Glucose, Bld: 88 mg/dL (ref 65–99)
Potassium: 4 mmol/L (ref 3.5–5.1)
Sodium: 144 mmol/L (ref 135–145)

## 2015-04-13 LAB — CBC
HCT: 38.2 % — ABNORMAL LOW (ref 39.0–52.0)
Hemoglobin: 11.8 g/dL — ABNORMAL LOW (ref 13.0–17.0)
MCH: 33.1 pg (ref 26.0–34.0)
MCHC: 30.9 g/dL (ref 30.0–36.0)
MCV: 107.3 fL — ABNORMAL HIGH (ref 78.0–100.0)
Platelets: 87 K/uL — ABNORMAL LOW (ref 150–400)
RBC: 3.56 MIL/uL — ABNORMAL LOW (ref 4.22–5.81)
RDW: 18.1 % — ABNORMAL HIGH (ref 11.5–15.5)
WBC: 9 K/uL (ref 4.0–10.5)

## 2015-04-13 NOTE — H&P (Signed)
General Surgery [facility]  Thomas Cherry. Thomas Cherry DOB: [birth year] Married / Language: English / Race: White Male  History of Present Illness  The patient is a 70 year old male who presents with an inguinal hernia.  Patient is referred by the physician assistant at the bone marrow transplant clinic at [facility], [name]. Patient's primary care physician is [name]. Patient has had previous right inguinal hernia repaired over 10 years ago emergently by [name]. Approximately 4-5 years ago the patient developed a left inguinal hernia which is gradually increased in size. Patient denies any signs or symptoms of obstruction. He does note that the hernia has become more difficult to reduce. Patient has had no other abdominal surgery. Patient has had a bone marrow transplant for myeloproliferative disease in 2015 at [facility]. He is on chronic steroids. He has had a left hip fracture due to his steroid dependency. He recently underwent repair. He wears a brace on the left hip.  Other Problems Enlarged Prostate Gastroesophageal Reflux Disease High blood pressure Inguinal Hernia Melanoma Transfusion history  Past Surgical History Colon Polyp Removal - Colonoscopy Hip Surgery Left. Open Inguinal Hernia Surgery Right. Oral Surgery  Diagnostic Studies History Colonoscopy 1-5 years ago  Allergies No Known Drug Allergies02/20/2017  Medication History ValACYclovir HCl (500MG  Tablet, Oral) Active. Sertraline HCl (25MG  Tablet, Oral) Active. Pantoprazole Sodium (40MG  Tablet DR, Oral) Active. PredniSONE (10MG  Tablet, Oral) Active. Fludrocortisone Acetate (0.1MG  Tablet, Oral) Active. Tacrolimus (1MG  Capsule, Oral) Active. Medications Reconciled  Social History Caffeine use Carbonated beverages, Coffee. No alcohol use No drug use Tobacco use Never smoker.  Family History Breast Cancer  Sister. Cancer Father. Cervical Cancer Mother. Ovarian Cancer Mother. Respiratory Condition Father.  Review of Systems General Present- Appetite Loss, Fatigue and Weight Loss. Not Present- Chills, Fever, Night Sweats and Weight Gain. Skin Present- Dryness and Non-Healing Wounds. Not Present- Change in Wart/Mole, Hives, Jaundice, New Lesions, Rash and Ulcer. HEENT Present- Hearing Loss. Not Present- Earache, Hoarseness, Nose Bleed, Oral Ulcers, Ringing in the Ears, Seasonal Allergies, Sinus Pain, Sore Throat, Visual Disturbances, Wears glasses/contact lenses and Yellow Eyes. Respiratory Not Present- Bloody sputum, Chronic Cough, Difficulty Breathing, Snoring and Wheezing. Breast Not Present- Breast Mass, Breast Pain, Nipple Discharge and Skin Changes. Cardiovascular Not Present- Chest Pain, Difficulty Breathing Lying Down, Leg Cramps, Palpitations, Rapid Heart Rate, Shortness of Breath and Swelling of Extremities. Gastrointestinal Not Present- Abdominal Pain, Bloating, Bloody Stool, Change in Bowel Habits, Chronic diarrhea, Constipation, Difficulty Swallowing, Excessive gas, Gets full quickly at meals, Hemorrhoids, Indigestion, Nausea, Rectal Pain and Vomiting. Male Genitourinary Present- Nocturia and Urgency. Not Present- Blood in Urine, Change in Urinary Stream, Frequency, Impotence, Painful Urination and Urine Leakage. Musculoskeletal Present- Muscle Weakness. Not Present- Back Pain, Joint Pain, Joint Stiffness, Muscle Pain and Swelling of Extremities. Neurological Present- Trouble walking and Weakness. Not Present- Decreased Memory, Fainting, Headaches, Numbness, Seizures, Tingling and Tremor. Psychiatric Not Present- Anxiety, Bipolar, Change in Sleep Pattern, Depression, Fearful and Frequent crying. Endocrine Not Present- Cold Intolerance, Excessive Hunger, Hair Changes, Heat Intolerance, Hot flashes and New Diabetes. Hematology Present- Easy Bruising. Not Present- Excessive bleeding,  Gland problems, HIV and Persistent Infections.  Vitals Weight: 128.6 lb Height: 69in Body Surface Area: 1.71 m Body Mass Index: 18.99 kg/m  Pulse: 80 (Regular)  BP: 122/84 (Sitting, Left Arm, Standard)   Physical Exam  General - appears comfortable, no distress; not diaphorectic  HEENT - normocephalic; sclerae clear, gaze conjugate; mucous membranes  moist, dentition good; voice normal  Neck - symmetric on extension; no palpable anterior or posterior cervical adenopathy; no palpable masses in the thyroid bed  Chest - clear bilaterally without rhonchi, rales, or wheeze  Cor - regular rhythm with normal rate; no significant murmur  Abd - soft without distension  GU - palpation in the right inguinal canal with cough and Valsalva shows no sign of recurrence. Patient is quite thin and suture material is palpable. In the left inguinal region is a obvious large indirect inguinal hernia containing viscera. This is not fully reducible in the standing position. It is nontender. Penis and testicles are normal without mass or lesion.  Ext - non-tender without significant edema or lymphedema  Neuro - grossly intact; no tremor   Assessment & Plan  INGUINAL HERNIA OF LEFT SIDE WITHOUT OBSTRUCTION OR GANGRENE (K40.90)  Pt Education - Pamphlet Given - Hernia Surgery: discussed with patient and provided information.  Patient presents with left inguinal hernia with impending incarceration. Patient will require open repair with mesh. Written literature on hernia surgery as provided to the patient.  Patient and I discussed the options for management including laparoscopic technique versus open technique. I have recommended open surgery due to the lower risk of recurrence and to the overall size of his hernia. We discussed risk and benefits including the potential for recurrence. We have discussed the fact that steroids delay wound healing and may make infection or recurrence a greater  risk. We have discussed restrictions on his activities after the surgery. Patient understands and agrees to proceed.  The risks and benefits of the procedure have been discussed at length with the patient. The patient understands the proposed procedure, potential alternative treatments, and the course of recovery to be expected. All of the patient's questions have been answered at this time. The patient wishes to proceed with surgery.  Earnstine Regal, MD, [facility] Office: [phone]

## 2015-04-13 NOTE — Progress Notes (Signed)
PCP is Renato Shin  Patient informed Nurse that he saw Dr. Virl Axe once because he was informed that he had atrial fibrillation but patient has not seen Dr. Caryl Comes since because he did not have atrial fibrillation. Patient stated "Dr. Caryl Comes said get out of here.....you don't have atrial fibrillation."   Patient denied having any acute cardiac or pulmonary issues  Patient informed Nurse that he had a bone marrow transplant at [facility], and that he sees Dr. Virgina Norfolk at the [facility]. Patient stated he recently had a EKG done there. Will request records; encounter noted under care everywhere tab.   Urologist is Dr. Gaynelle Arabian at [facility].  Patient arrived to PAT via walker, accompanied by his wife.

## 2015-04-14 MED ORDER — CEFAZOLIN SODIUM-DEXTROSE 2-3 GM-% IV SOLR
2.0000 g | INTRAVENOUS | Status: AC
  Administered 2015-04-15: 2 g via INTRAVENOUS
  Filled 2015-04-14: qty 50

## 2015-04-15 ENCOUNTER — Encounter (HOSPITAL_COMMUNITY): Payer: Self-pay | Admitting: General Practice

## 2015-04-15 ENCOUNTER — Encounter (HOSPITAL_COMMUNITY)
Admission: RE | Disposition: A | Discharge status: Home / Self Care | Payer: Self-pay | Source: Ambulatory Visit | Attending: Surgery

## 2015-04-15 ENCOUNTER — Ambulatory Visit (HOSPITAL_COMMUNITY): Payer: Medicare Other | Admitting: Emergency Medicine

## 2015-04-15 ENCOUNTER — Ambulatory Visit (HOSPITAL_COMMUNITY)
Admission: RE | Admit: 2015-04-15 | Discharge: 2015-04-15 | Disposition: A | Discharge status: Home / Self Care | Payer: Medicare Other | Source: Ambulatory Visit | Attending: Surgery | Admitting: Surgery

## 2015-04-15 ENCOUNTER — Ambulatory Visit (HOSPITAL_COMMUNITY): Payer: Medicare Other | Admitting: Anesthesiology

## 2015-04-15 DIAGNOSIS — Z9481 Bone marrow transplant status: Secondary | ICD-10-CM | POA: Insufficient documentation

## 2015-04-15 DIAGNOSIS — Z79899 Other long term (current) drug therapy: Secondary | ICD-10-CM | POA: Insufficient documentation

## 2015-04-15 DIAGNOSIS — N400 Enlarged prostate without lower urinary tract symptoms: Secondary | ICD-10-CM | POA: Insufficient documentation

## 2015-04-15 DIAGNOSIS — I10 Essential (primary) hypertension: Secondary | ICD-10-CM | POA: Insufficient documentation

## 2015-04-15 DIAGNOSIS — K219 Gastro-esophageal reflux disease without esophagitis: Secondary | ICD-10-CM | POA: Diagnosis not present

## 2015-04-15 DIAGNOSIS — Z7952 Long term (current) use of systemic steroids: Secondary | ICD-10-CM | POA: Insufficient documentation

## 2015-04-15 DIAGNOSIS — Z85820 Personal history of malignant melanoma of skin: Secondary | ICD-10-CM | POA: Insufficient documentation

## 2015-04-15 DIAGNOSIS — K4090 Unilateral inguinal hernia, without obstruction or gangrene, not specified as recurrent: Secondary | ICD-10-CM | POA: Diagnosis not present

## 2015-04-15 DIAGNOSIS — C946 Myelodysplastic disease, not classified: Secondary | ICD-10-CM | POA: Diagnosis not present

## 2015-04-15 HISTORY — PX: INSERTION OF MESH: SHX5868

## 2015-04-15 HISTORY — PX: INGUINAL HERNIA REPAIR: SHX194

## 2015-04-15 SURGERY — REPAIR, HERNIA, INGUINAL, ADULT
Anesthesia: General | Site: Groin | Laterality: Left

## 2015-04-15 MED ORDER — BUPIVACAINE HCL (PF) 0.25 % IJ SOLN
INTRAMUSCULAR | Status: DC | PRN
  Administered 2015-04-15: 20 mL

## 2015-04-15 MED ORDER — LACTATED RINGERS IV SOLN
INTRAVENOUS | Status: DC
  Administered 2015-04-15: 09:00:00 via INTRAVENOUS

## 2015-04-15 MED ORDER — PROPOFOL 10 MG/ML IV BOLUS
INTRAVENOUS | Status: AC
  Filled 2015-04-15: qty 20

## 2015-04-15 MED ORDER — ROCURONIUM BROMIDE 100 MG/10ML IV SOLN
INTRAVENOUS | Status: DC | PRN
  Administered 2015-04-15: 40 mg via INTRAVENOUS

## 2015-04-15 MED ORDER — FENTANYL CITRATE (PF) 100 MCG/2ML IJ SOLN
100.0000 ug | Freq: Once | INTRAMUSCULAR | Status: AC
  Administered 2015-04-15: 100 ug via INTRAVENOUS

## 2015-04-15 MED ORDER — FENTANYL CITRATE (PF) 100 MCG/2ML IJ SOLN
INTRAMUSCULAR | Status: AC
  Filled 2015-04-15: qty 2

## 2015-04-15 MED ORDER — BUPIVACAINE-EPINEPHRINE (PF) 0.5% -1:200000 IJ SOLN
INTRAMUSCULAR | Status: DC | PRN
  Administered 2015-04-15: 30 mL

## 2015-04-15 MED ORDER — LACTATED RINGERS IV SOLN
INTRAVENOUS | Status: DC | PRN
  Administered 2015-04-15: 10:00:00 via INTRAVENOUS

## 2015-04-15 MED ORDER — PROPOFOL 10 MG/ML IV BOLUS
INTRAVENOUS | Status: DC | PRN
  Administered 2015-04-15: 100 mg via INTRAVENOUS
  Administered 2015-04-15: 20 mg via INTRAVENOUS

## 2015-04-15 MED ORDER — HYDROCODONE-ACETAMINOPHEN 5-325 MG PO TABS: 1.0000 | ORAL_TABLET | ORAL | Status: DC | PRN

## 2015-04-15 MED ORDER — MIDAZOLAM HCL 2 MG/2ML IJ SOLN
INTRAMUSCULAR | Status: AC
  Filled 2015-04-15: qty 2

## 2015-04-15 MED ORDER — FENTANYL CITRATE (PF) 250 MCG/5ML IJ SOLN
INTRAMUSCULAR | Status: AC
  Filled 2015-04-15: qty 5

## 2015-04-15 MED ORDER — MIDAZOLAM HCL 2 MG/2ML IJ SOLN: 0.5000 mg | Freq: Once | INTRAMUSCULAR | Status: DC | PRN

## 2015-04-15 MED ORDER — LIDOCAINE HCL (CARDIAC) 20 MG/ML IV SOLN
INTRAVENOUS | Status: AC
  Filled 2015-04-15: qty 5

## 2015-04-15 MED ORDER — ONDANSETRON HCL 4 MG/2ML IJ SOLN
INTRAMUSCULAR | Status: DC | PRN
  Administered 2015-04-15: 4 mg via INTRAVENOUS

## 2015-04-15 MED ORDER — GLYCOPYRROLATE 0.2 MG/ML IJ SOLN
INTRAMUSCULAR | Status: DC | PRN
  Administered 2015-04-15: 0.4 mg via INTRAVENOUS

## 2015-04-15 MED ORDER — MIDAZOLAM HCL 2 MG/2ML IJ SOLN
2.0000 mg | Freq: Once | INTRAMUSCULAR | Status: AC
  Administered 2015-04-15: 2 mg via INTRAVENOUS

## 2015-04-15 MED ORDER — MEPERIDINE HCL 25 MG/ML IJ SOLN: 6.2500 mg | INTRAMUSCULAR | Status: DC | PRN

## 2015-04-15 MED ORDER — 0.9 % SODIUM CHLORIDE (POUR BTL) OPTIME
TOPICAL | Status: DC | PRN
  Administered 2015-04-15: 1000 mL

## 2015-04-15 MED ORDER — PROMETHAZINE HCL 25 MG/ML IJ SOLN: 6.2500 mg | INTRAMUSCULAR | Status: DC | PRN

## 2015-04-15 MED ORDER — BUPIVACAINE HCL (PF) 0.25 % IJ SOLN
INTRAMUSCULAR | Status: AC
  Filled 2015-04-15: qty 30

## 2015-04-15 MED ORDER — HYDROMORPHONE HCL 1 MG/ML IJ SOLN
INTRAMUSCULAR | Status: AC
  Filled 2015-04-15: qty 1

## 2015-04-15 MED ORDER — HYDROMORPHONE HCL 1 MG/ML IJ SOLN
0.2500 mg | INTRAMUSCULAR | Status: DC | PRN
  Administered 2015-04-15: 0.5 mg via INTRAVENOUS

## 2015-04-15 MED ORDER — NEOSTIGMINE METHYLSULFATE 10 MG/10ML IV SOLN
INTRAVENOUS | Status: DC | PRN
  Administered 2015-04-15: 3 mg via INTRAVENOUS

## 2015-04-15 MED ORDER — ROCURONIUM BROMIDE 50 MG/5ML IV SOLN
INTRAVENOUS | Status: AC
  Filled 2015-04-15: qty 1

## 2015-04-15 MED ORDER — LIDOCAINE HCL (CARDIAC) 20 MG/ML IV SOLN
INTRAVENOUS | Status: DC | PRN
  Administered 2015-04-15: 20 mg via INTRAVENOUS

## 2015-04-15 MED ORDER — FENTANYL CITRATE (PF) 100 MCG/2ML IJ SOLN
INTRAMUSCULAR | Status: DC | PRN
Start: 2015-04-15 — End: 2015-04-15
  Administered 2015-04-15: 150 ug via INTRAVENOUS
  Administered 2015-04-15 (×2): 50 ug via INTRAVENOUS

## 2015-04-15 SURGICAL SUPPLY — 56 items
APL SKNCLS STERI-STRIP NONHPOA (GAUZE/BANDAGES/DRESSINGS) ×1
BENZOIN TINCTURE PRP APPL 2/3 (GAUZE/BANDAGES/DRESSINGS) ×3 IMPLANT
BLADE SURG ROTATE 9660 (MISCELLANEOUS) IMPLANT
CANISTER SUCTION 2500CC (MISCELLANEOUS) IMPLANT
CHLORAPREP W/TINT 26ML (MISCELLANEOUS) ×3 IMPLANT
CLOSURE WOUND 1/2 X4 (GAUZE/BANDAGES/DRESSINGS) ×1
COVER SURGICAL LIGHT HANDLE (MISCELLANEOUS) ×3 IMPLANT
DRAIN PENROSE 1/2X12 LTX STRL (WOUND CARE) ×2 IMPLANT
DRAPE LAPAROTOMY TRNSV 102X78 (DRAPE) ×3 IMPLANT
DRAPE UTILITY XL STRL (DRAPES) ×6 IMPLANT
ELECT CAUTERY BLADE 6.4 (BLADE) ×3 IMPLANT
ELECT REM PT RETURN 9FT ADLT (ELECTROSURGICAL) ×3
ELECTRODE REM PT RTRN 9FT ADLT (ELECTROSURGICAL) ×1 IMPLANT
GAUZE SPONGE 4X4 12PLY STRL (GAUZE/BANDAGES/DRESSINGS) ×3 IMPLANT
GLOVE BIO SURGEON STRL SZ7.5 (GLOVE) ×2 IMPLANT
GLOVE BIOGEL PI IND STRL 6 (GLOVE) IMPLANT
GLOVE BIOGEL PI IND STRL 6.5 (GLOVE) IMPLANT
GLOVE BIOGEL PI IND STRL 7.0 (GLOVE) IMPLANT
GLOVE BIOGEL PI IND STRL 7.5 (GLOVE) IMPLANT
GLOVE BIOGEL PI INDICATOR 6 (GLOVE) ×2
GLOVE BIOGEL PI INDICATOR 6.5 (GLOVE) ×2
GLOVE BIOGEL PI INDICATOR 7.0 (GLOVE) ×2
GLOVE BIOGEL PI INDICATOR 7.5 (GLOVE) ×2
GLOVE SURG ORTHO 8.0 STRL STRW (GLOVE) ×3 IMPLANT
GLOVE SURG SS PI 6.0 STRL IVOR (GLOVE) ×2 IMPLANT
GOWN STRL REUS W/ TWL LRG LVL3 (GOWN DISPOSABLE) ×1 IMPLANT
GOWN STRL REUS W/ TWL XL LVL3 (GOWN DISPOSABLE) ×1 IMPLANT
GOWN STRL REUS W/TWL LRG LVL3 (GOWN DISPOSABLE) ×9
GOWN STRL REUS W/TWL XL LVL3 (GOWN DISPOSABLE) ×3
KIT BASIN OR (CUSTOM PROCEDURE TRAY) ×3 IMPLANT
KIT ROOM TURNOVER OR (KITS) ×3 IMPLANT
MESH ULTRAPRO 3X6 7.6X15CM (Mesh General) ×2 IMPLANT
NDL HYPO 25GX1X1/2 BEV (NEEDLE) ×1 IMPLANT
NEEDLE HYPO 25GX1X1/2 BEV (NEEDLE) ×3 IMPLANT
NS IRRIG 1000ML POUR BTL (IV SOLUTION) ×3 IMPLANT
PACK SURGICAL SETUP 50X90 (CUSTOM PROCEDURE TRAY) ×3 IMPLANT
PAD ARMBOARD 7.5X6 YLW CONV (MISCELLANEOUS) ×3 IMPLANT
PENCIL BUTTON HOLSTER BLD 10FT (ELECTRODE) ×3 IMPLANT
SPECIMEN JAR SMALL (MISCELLANEOUS) IMPLANT
SPONGE LAP 18X18 X RAY DECT (DISPOSABLE) ×3 IMPLANT
STRIP CLOSURE SKIN 1/2X4 (GAUZE/BANDAGES/DRESSINGS) ×2 IMPLANT
SUT MNCRL AB 4-0 PS2 18 (SUTURE) ×3 IMPLANT
SUT NOVA NAB GS-22 2 0 T19 (SUTURE) ×6 IMPLANT
SUT NOVAFIL 0 T 12 (SUTURE) ×2 IMPLANT
SUT SILK 2 0 SH (SUTURE) ×2 IMPLANT
SUT SILK 3 0 (SUTURE)
SUT SILK 3-0 18XBRD TIE 12 (SUTURE) ×1 IMPLANT
SUT VIC AB 3-0 SH 18 (SUTURE) ×3 IMPLANT
SYR BULB 3OZ (MISCELLANEOUS) ×3 IMPLANT
SYR CONTROL 10ML LL (SYRINGE) ×3 IMPLANT
TAPE CLOTH SURG 4X10 WHT LF (GAUZE/BANDAGES/DRESSINGS) ×2 IMPLANT
TOWEL OR 17X24 6PK STRL BLUE (TOWEL DISPOSABLE) ×3 IMPLANT
TOWEL OR 17X26 10 PK STRL BLUE (TOWEL DISPOSABLE) ×3 IMPLANT
TUBE CONNECTING 12'X1/4 (SUCTIONS)
TUBE CONNECTING 12X1/4 (SUCTIONS) IMPLANT
YANKAUER SUCT BULB TIP NO VENT (SUCTIONS) IMPLANT

## 2015-04-15 NOTE — Anesthesia Procedure Notes (Addendum)
Anesthesia Regional Block:  TAP block  Pre-Anesthetic Checklist: ,, timeout performed, Correct Patient, Correct Site, Correct Laterality, Correct Procedure, Correct Position, site marked, Risks and benefits discussed,  Surgical consent,  Pre-op evaluation,  At surgeon's request and post-op pain management  Laterality: Left and Lower  Prep: chloraprep       Needles:  Injection technique: Single-shot  Needle Type: Echogenic Stimulator Needle     Needle Length: 10cm 10 cm Needle Gauge: 22 and 22 G    Additional Needles:  Procedures: ultrasound guided (picture in chart) TAP block Narrative:  Start time: 04/15/2015 9:40 AM End time: 04/15/2015 9:52 AM Injection made incrementally with aspirations every 5 mL.  Performed by: Personally  Anesthesiologist: Glennon Mac, [place]  Additional Notes: Pt identified in Holding room.  Monitors applied. Working IV access confirmed. Sterile prep, drape L flank.  #22ga ECHOgenic needle into TAP with US guidance.  30cc 0.5% Bupivacaine with 1:200k epi injected incrementally after negative test dose, good spread of local anesthetic.  Patient asymptomatic, VSS, no heme aspirated, tolerated well.  Jenita Seashore, MD   Procedure Name: Intubation Date/Time: 04/15/2015 10:10 AM Performed by: Purvis Kilts Pre-anesthesia Checklist: Patient identified, Timeout performed, Emergency Drugs available, Suction available and Patient being monitored Patient Re-evaluated:Patient Re-evaluated prior to inductionOxygen Delivery Method: Circle system utilized Preoxygenation: Pre-oxygenation with 100% oxygen Intubation Type: IV induction Ventilation: Mask ventilation without difficulty Laryngoscope Size: Mac and 4 Grade View: Grade II Tube type: Oral Tube size: 7.5 mm Number of attempts: 1 Placement Confirmation: ETT inserted through vocal cords under direct vision,  breath sounds checked- equal and bilateral and positive ETCO2 Secured at: 21 cm Tube secured with:  Tape Dental Injury: Teeth and Oropharynx as per pre-operative assessment

## 2015-04-15 NOTE — Anesthesia Postprocedure Evaluation (Signed)
Anesthesia Post Note  Patient: Thomas Cherry  Procedure(s) Performed: Procedure(s) (LRB): LEFT HERNIA REPAIR INGUINAL ADULT WITH MESH  (Left) INSERTION OF MESH (Left)  Patient location during evaluation: PACU Anesthesia Type: General and Regional Level of consciousness: awake and alert Pain management: pain level controlled Vital Signs Assessment: post-procedure vital signs reviewed and stable Respiratory status: spontaneous breathing, nonlabored ventilation and respiratory function stable Cardiovascular status: blood pressure returned to baseline and stable Postop Assessment: no signs of nausea or vomiting Anesthetic complications: no    Last Vitals:  Filed Vitals:   04/15/15 1145 04/15/15 1200  BP: 174/93   Pulse: 88 102  Temp:    Resp: 8 16    Last Pain: There were no vitals filed for this visit.  LLE Motor Response: Purposeful movement;Responds to commands (04/15/15 1200) LLE Sensation: Full sensation (04/15/15 1200)          Hulan Szumski,E. Tyri Elmore

## 2015-04-15 NOTE — Anesthesia Preprocedure Evaluation (Addendum)
Anesthesia Evaluation  Patient identified by MRN, date of birth, ID band Patient awake    Reviewed: Allergy & Precautions, NPO status , Patient's Chart, lab work & pertinent test results  History of Anesthesia Complications Negative for: history of anesthetic complications  Airway Mallampati: II  TM Distance: >3 FB Neck ROM: Full    Dental  (+) Chipped, Dental Advisory Given   Pulmonary neg pulmonary ROS,    breath sounds clear to auscultation       Cardiovascular hypertension, (-) angina Rhythm:Regular Rate:Normal  '12 ECHO: EF 0000000, grade 2 diastolic dysfunction, valves OK   Neuro/Psych Peripheral neuropathy    GI/Hepatic Neg liver ROS, GERD  Medicated and Controlled,  Endo/Other  negative endocrine ROS  Renal/GU Renal InsufficiencyRenal disease (creat 1.40)     Musculoskeletal  (+) Arthritis , Osteoarthritis,    Abdominal   Peds  Hematology  (+) Blood dyscrasia (plt 87K, Hb 11.8), , myeloproliferative disorder (myelofibrosis): now s/p BM transplant   Anesthesia Other Findings   Reproductive/Obstetrics                          Anesthesia Physical Anesthesia Plan  ASA: III  Anesthesia Plan: General   Post-op Pain Management:    Induction: Intravenous  Airway Management Planned: Oral ETT  Additional Equipment:   Intra-op Plan:   Post-operative Plan: Extubation in OR  Informed Consent: I have reviewed the patients History and Physical, chart, labs and discussed the procedure including the risks, benefits and alternatives for the proposed anesthesia with the patient or authorized representative who has indicated his/her understanding and acceptance.   Dental advisory given  Plan Discussed with: CRNA and Surgeon  Anesthesia Plan Comments: (Plan routine monitors, GETA, TAP block for post op analgesia)       Anesthesia Quick Evaluation

## 2015-04-15 NOTE — Progress Notes (Signed)
Manual blood pressure 218/90.  Dr. Glennon Mac notified.

## 2015-04-15 NOTE — Op Note (Signed)
Inguinal Hernia, Open, Procedure Note  Pre-operative Diagnosis:  Left inguinal hernia  Post-operative Diagnosis: same  Surgeon:  Earnstine Regal, MD, FACS  Assistant:  Sharyn Dross, RNFA  Anesthesia:  General  Preparation:  Chlora-prep  Estimated Blood Loss: Minimal  Complications:  none  Indications: The patient presented with a left, reducible hernia.    Procedure Details  The patient was evaluated in the holding area. All of the patient's questions were answered and the proposed procedure was confirmed. The site of the procedure was properly marked. The patient was taken to the Operating Room, identified by name, and the procedure verified as inguinal hernia repair.  The patient was placed in the supine position and underwent induction of anesthesia. A "Time Out" was performed per routine. The lower abdomen and groin were prepped and draped in the usual aseptic fashion.  After ascertaining that an adequate level of anesthesia had been obtained, an incision was made in the groin with a #10 blade.  Dissection was carried through the subcutaneous tissues and hemostasis obtained with the electrocautery.  A Gelpi retractor was placed for exposure.  The external oblique fascia was incised in line with it's fibers and extended through the external inguinal ring.  The cord structures were dissected out of the inguinal canal and encircled with a Penrose drain.  The floor of the inguinal canal was dissected out.  There was no direct hernia.  The cord was explored and a very large indirect hernia sac was dissected out and opened.  It contained a significant loop of sigmoid colon which was reduced.  The sac was closed at the neck with a 0-Novofil pursestring suture.  The sac was amputated with the electrocautery.  The floor of the inguinal canal was reconstructed with Ethicon Ultrapro mesh cut to the appropriate dimensions.  It was secured to the pubic tubercle with a 2-0 Novafil suture and along the  inguinal ligament with a running 2-0 Novafil suture.  Mesh was split to accommodate the cord structures.  The superior margin of the mesh was secured to the transversalis and internal oblique musculature with interrupted 2-0 Novafil sutures.  The tails of the mesh were overlapped lateral to the cord structures and secured to the inguinal ligament with interrupted 2-0 Novafil sutures to recreate the internal inguinal ring.  Cord structures were returned to the inguinal canal.  Local anesthetic was infiltrated throughout the field.  External oblique fascia was closed with interrupted 3-0 Vicryl sutures.  Subcutaneous tissues were closed with interrupted 3-0 Vicryl sutures.  Skin was anesthetized with local anesthetic, and the skin edges were re-approximated with a running 4-0 Monocryl suture.  Wound was washed and dried and benzoin and steristrips were applied.  A gauze dressing was applied.  Instrument, sponge, and needle counts were correct prior to closure and at the conclusion of the case.  The patient tolerated the procedure well.  The patient was awakened from anesthesia and brought to the recovery room in stable condition.  Earnstine Regal, MD, [facility] Office: [phone]

## 2015-04-15 NOTE — Transfer of Care (Signed)
Immediate Anesthesia Transfer of Care Note  Patient: Thomas Cherry  Procedure(s) Performed: Procedure(s): LEFT HERNIA REPAIR INGUINAL ADULT WITH MESH  (Left) INSERTION OF MESH (Left)  Patient Location: PACU  Anesthesia Type:General  Level of Consciousness: awake  Airway & Oxygen Therapy: Patient Spontanous Breathing and Patient connected to nasal cannula oxygen  Post-op Assessment: Report given to RN and Post -op Vital signs reviewed and stable  Post vital signs: Reviewed and stable  Last Vitals:  Filed Vitals:   04/15/15 0834 04/15/15 0845  BP:  218/90  Pulse: 83   Temp: 35.9 C   Resp: 18     Complications: No apparent anesthesia complications

## 2015-04-15 NOTE — Progress Notes (Signed)
Dr. Glennon Mac made aware of last BP 180/95, same as pre-op BP. OK to go home.

## 2015-04-15 NOTE — Interval H&P Note (Signed)
History and Physical Interval Note:  04/15/2015 9:41 AM  Thomas Cherry  has presented today for surgery, with the diagnosis of Left inguinal hernia.   The various methods of treatment have been discussed with the patient and family. After consideration of risks, benefits and other options for treatment, the patient has consented to    Procedure(s): LEFT HERNIA REPAIR INGUINAL ADULT WITH MESH  (Left) INSERTION OF MESH (Left) as a surgical intervention .   The patient's history has been reviewed, patient examined, no change in status, stable for surgery.  I have reviewed the patient's chart and labs.  Questions were answered to the patient's satisfaction.    Earnstine Regal, MD, [facility] Office: [place]

## 2015-04-18 ENCOUNTER — Encounter (HOSPITAL_COMMUNITY): Payer: Self-pay | Admitting: Surgery

## 2015-05-30 ENCOUNTER — Telehealth: Payer: Self-pay | Admitting: Endocrinology

## 2015-05-30 NOTE — Telephone Encounter (Signed)
We are happy to check here

## 2015-05-30 NOTE — Telephone Encounter (Signed)
Pt feels he has a lot of wax build up in his ear and wanted to know if Dr. Loanne Drilling could suggest someone to go to for this

## 2015-05-30 NOTE — Telephone Encounter (Signed)
See note below and please advise, Thanks! 

## 2015-05-31 NOTE — Telephone Encounter (Signed)
Left a vm advising of note below. Requested a call back from the to discuss.

## 2015-06-16 ENCOUNTER — Ambulatory Visit: Payer: Medicare Other | Admitting: Endocrinology

## 2015-07-01 ENCOUNTER — Telehealth: Payer: Self-pay | Admitting: Internal Medicine

## 2015-07-01 ENCOUNTER — Telehealth: Payer: Self-pay | Admitting: Medical Oncology

## 2015-07-01 ENCOUNTER — Other Ambulatory Visit: Payer: Self-pay | Admitting: Medical Oncology

## 2015-07-01 ENCOUNTER — Telehealth: Payer: Self-pay | Admitting: *Deleted

## 2015-07-01 DIAGNOSIS — R7989 Other specified abnormal findings of blood chemistry: Secondary | ICD-10-CM

## 2015-07-01 NOTE — Telephone Encounter (Signed)
S.w. Pt and advised on 5.20 appt...the patient ok and aware

## 2015-07-01 NOTE — Telephone Encounter (Signed)
[facility] requests pt get 1 liter of normal saline over 2 hours tomorrow for elevated creatinine at 1.4. Onc tx request sent. Orders signed and held

## 2015-07-01 NOTE — Telephone Encounter (Signed)
Per staff message and POF I have scheduled appts. Advised scheduler of appts. JMW  

## 2015-07-02 ENCOUNTER — Ambulatory Visit (HOSPITAL_BASED_OUTPATIENT_CLINIC_OR_DEPARTMENT_OTHER): Payer: Medicare Other

## 2015-07-02 VITALS — BP 143/80 | HR 74 | Temp 98.2°F | Resp 16

## 2015-07-02 DIAGNOSIS — R748 Abnormal levels of other serum enzymes: Secondary | ICD-10-CM | POA: Diagnosis present

## 2015-07-02 DIAGNOSIS — R7989 Other specified abnormal findings of blood chemistry: Secondary | ICD-10-CM

## 2015-07-02 DIAGNOSIS — D7581 Myelofibrosis: Secondary | ICD-10-CM | POA: Diagnosis not present

## 2015-07-02 MED ORDER — HEPARIN SOD (PORK) LOCK FLUSH 100 UNIT/ML IV SOLN
500.0000 [IU] | Freq: Once | INTRAVENOUS | Status: AC | PRN
  Administered 2015-07-02: 500 [IU]
  Filled 2015-07-02: qty 5

## 2015-07-02 MED ORDER — SODIUM CHLORIDE 0.9 % IJ SOLN
10.0000 mL | INTRAMUSCULAR | Status: DC | PRN
  Administered 2015-07-02: 10 mL
  Filled 2015-07-02: qty 10

## 2015-07-02 MED ORDER — SODIUM CHLORIDE 0.9 % IV SOLN
INTRAVENOUS | Status: DC
  Administered 2015-07-02: 08:00:00 via INTRAVENOUS

## 2015-07-02 NOTE — Patient Instructions (Signed)

## 2015-07-13 ENCOUNTER — Encounter: Payer: Self-pay | Admitting: Endocrinology

## 2015-07-13 ENCOUNTER — Ambulatory Visit (INDEPENDENT_AMBULATORY_CARE_PROVIDER_SITE_OTHER): Payer: Medicare Other | Admitting: Endocrinology

## 2015-07-13 VITALS — BP 140/82 | HR 73 | Temp 98.1°F | Wt 149.0 lb

## 2015-07-13 DIAGNOSIS — I10 Essential (primary) hypertension: Secondary | ICD-10-CM

## 2015-07-13 DIAGNOSIS — E039 Hypothyroidism, unspecified: Secondary | ICD-10-CM | POA: Insufficient documentation

## 2015-07-13 DIAGNOSIS — Z125 Encounter for screening for malignant neoplasm of prostate: Secondary | ICD-10-CM

## 2015-07-13 DIAGNOSIS — Z0000 Encounter for general adult medical examination without abnormal findings: Secondary | ICD-10-CM | POA: Diagnosis not present

## 2015-07-13 DIAGNOSIS — E7800 Pure hypercholesterolemia, unspecified: Secondary | ICD-10-CM | POA: Diagnosis not present

## 2015-07-13 NOTE — Progress Notes (Signed)
Subjective:    Patient ID: Thomas Cherry, male    DOB: 12/29/1945, 70 y.o.   MRN: [id number]  HPI  He fx left hip 5 mos ago.  He declines rx for this, as he is soon to receive zometa.  He denies dizziness, cramps, and insomnia. He has slight dryness of the mouth, but no assoc pain.   Past Medical History  Diagnosis Date  . GERD (gastroesophageal reflux disease)   . Allergy     heyfever  . Hypertension   . Palpitation   . Elevated platelet count (HCC)     on hydroxyurea  . Heart murmur   . Urgency of urination   . Arthritis   . Skin cancer     removed from ear, scalp, and nose  . Bruises easily   . Thrombocytopenia [facility])     Past Surgical History  Procedure Laterality Date  . Total hip arthroplasty Left   . Hernia repair Right     inguinal  . Bone marrow transplant    . Colonoscopy w/ polypectomy    . Inguinal hernia repair Left 04/15/2015    Procedure: LEFT HERNIA REPAIR INGUINAL ADULT WITH MESH ;  Surgeon: Armandina Gemma, MD;  Location: [place];  Service: General;  Laterality: Left;  . Insertion of mesh Left 04/15/2015    Procedure: INSERTION OF MESH;  Surgeon: Armandina Gemma, MD;  Location: [place];  Service: General;  Laterality: Left;    Social History   Social History  . Marital Status: Married    Spouse Name: N/A  . Number of Children: 3  . Years of Education: N/A   Occupational History  . court reporter    Social History Main Topics  . Smoking status: Never Smoker   . Smokeless tobacco: Never Used  . Alcohol Use: No  . Drug Use: No  . Sexual Activity: Not on file   Other Topics Concern  . Not on file   Social History Narrative    Current Outpatient Prescriptions on File Prior to Visit  Medication Sig Dispense Refill  . calcium-vitamin D (CALCIUM 500/D) 500-200 MG-UNIT tablet Take 2 tablets by mouth 2 (two) times daily.    . clobetasol cream (TEMOVATE) [id number] % Apply 1 application topically 2 (two) times daily as needed (rash).    . pantoprazole (PROTONIX)  40 MG tablet Take 40 mg by mouth daily.    . prednisoLONE (PRELONE) 15 MG/5ML syrup Take 15 mg by mouth 3 (three) times daily.    . predniSONE (DELTASONE) 10 MG tablet Take 1 tablet (10 mg total) by mouth daily with breakfast. 30 tablet 1  . tacrolimus (PROGRAF) 0.5 MG capsule Take 1 capsule (0.5 mg total) by mouth 2 (two) times daily. 60 capsule 1  . valACYclovir (VALTREX) 500 MG tablet Take 1 tablet (500 mg total) by mouth daily. 30 tablet 0  . VESICARE 5 MG tablet Take 1 tablet by mouth every morning.     No current facility-administered medications on file prior to visit.    No Known Allergies  Family History  Problem Relation Age of Onset  . Ovarian cancer    . Uterine cancer    . Colitis      crohns  . Cancer Mother   . Cancer Father   . Transient ischemic attack Sister     Aortic valve replacement  . Colon cancer Neg Hx     BP 140/82 mmHg  Pulse 73  Temp(Src) 98.1 F (36.7 C) (  Oral)  Wt 149 lb (67.586 kg)  SpO2 97%  Review of Systems Denies chest pain and sob    Objective:   Physical Exam VITAL SIGNS:  See vs page.  GENERAL: no distress. LUNGS:  Clear to auscultation HEART:  Regular rate and rhythm without murmurs noted. Normal S1,S2.   Gait: steady with a walker.    (i reviewed labs from care everywhere): vit-D and PTH were normal    Assessment & Plan:  Dry mouth, new: uncertain etiology Hip fx: he declines rx here. Patient is advised the following: Patient Instructions  please consider these measures for your health:  minimize alcohol.  do not use tobacco products.  have a colonoscopy at least every 10 years from age 45.  Women should have an annual mammogram from age 42.  keep firearms safely stored.  always use seat belts.  have working smoke alarms in your home.  see an eye doctor and dentist regularly.  never drive under the influence of alcohol or drugs (including prescription drugs).  those with fair skin should take precautions against the sun. it  is critically important to prevent falling down (keep floor areas well-lit, dry, and free of loose objects.  If you have a cane, walker, or wheelchair, you should use it, even for short trips around the house.  Wear flat-soled shoes.  Also, try not to rush) Try drops of lemon juice for your dry mouth Please return in 1 year.     Subjective:   Patient here for Medicare annual wellness visit and management of other chronic and acute problems.     Risk factors: advanced age    67 of Physicians Providing Medical Care to Patient:  See "snapshot"   Activities of Daily Living: In your present state of health, do you have any difficulty performing the following activities?:  Preparing food and eating?: No  Bathing yourself: No  Getting dressed: yes Using the toilet:No  Moving around from place to place: No  In the past year have you fallen or had a near fall?:No    Home Safety: Has smoke detector and wears seat belts. No firearms. No excess sun exposure.   Diet and Exercise  Current exercise habits: limited by health probs Dietary issues discussed: pt reports a healthy diet   Depression Screen  Q1: Over the past two weeks, have you felt down, depressed or hopeless? Depression is well-controlled.  Q2: Over the past two weeks, have you felt little interest or pleasure in doing things? no   The following portions of the patient's history were reviewed and updated as appropriate: allergies, current medications, past family history, past medical history, past social history, past surgical history and problem list.   Review of Systems  Denies hearing loss, and visual loss Objective:   Vision:  Sees opthalmologist Dr Rosana Hoes, and declines [facility] today Hearing: grossly normal Body mass index:  See vs page Msk: pt easily and quickly performs "get-up-and-go" from a sitting position Cognitive Impairment Assessment: cognition, memory and judgment appear normal.  remembers 3/3 at 5 minutes.   excellent recall.  can easily read and write a sentence.  alert and oriented x 3   Assessment:   Medicare wellness utd on preventive parameters    Plan:   During the course of the visit the patient was educated and counseled about appropriate screening and preventive services including:        Fall prevention   Diabetes screening  Nutrition counseling   Vaccines /  LABS Zostavax / Pneumococcal Vaccine  today  PSA is up to date in care everywhere  Patient Instructions (the written plan) was given to the patient.   Renato Shin, MD

## 2015-07-13 NOTE — Patient Instructions (Addendum)
please consider these measures for your health:  minimize alcohol.  do not use tobacco products.  have a colonoscopy at least every 10 years from age 71.  Women should have an annual mammogram from age 42.  keep firearms safely stored.  always use seat belts.  have working smoke alarms in your home.  see an eye doctor and dentist regularly.  never drive under the influence of alcohol or drugs (including prescription drugs).  those with fair skin should take precautions against the sun. it is critically important to prevent falling down (keep floor areas well-lit, dry, and free of loose objects.  If you have a cane, walker, or wheelchair, you should use it, even for short trips around the house.  Wear flat-soled shoes.  Also, try not to rush) Try drops of lemon juice for your dry mouth Please return in 1 year.

## 2015-10-13 ENCOUNTER — Ambulatory Visit
Admission: RE | Admit: 2015-10-13 | Discharge: 2015-10-13 | Disposition: A | Discharge status: Home / Self Care | Payer: Medicare Other | Source: Ambulatory Visit | Attending: Endocrinology | Admitting: Endocrinology

## 2015-10-13 ENCOUNTER — Telehealth: Payer: Self-pay | Admitting: Endocrinology

## 2015-10-13 ENCOUNTER — Encounter: Payer: Self-pay | Admitting: Endocrinology

## 2015-10-13 ENCOUNTER — Ambulatory Visit (INDEPENDENT_AMBULATORY_CARE_PROVIDER_SITE_OTHER): Payer: Medicare Other | Admitting: Endocrinology

## 2015-10-13 DIAGNOSIS — R05 Cough: Secondary | ICD-10-CM | POA: Diagnosis not present

## 2015-10-13 DIAGNOSIS — R059 Cough, unspecified: Secondary | ICD-10-CM

## 2015-10-13 MED ORDER — AZITHROMYCIN 500 MG PO TABS: 500.0000 mg | ORAL_TABLET | Freq: Every day | ORAL | 0 refills | Status: DC

## 2015-10-13 NOTE — Progress Notes (Signed)
Subjective:    Patient ID: Thomas Cherry, male    DOB: 1945/08/05, 70 y.o.   MRN: [id number]  HPI Pt states 1 week of nasal congestion.  He now has few days of moderate cough in the chest, and assoc temp of 99.4.  He was rx'ed azithromycin, phenergan/codeine, and benzonatate yesterday by [facility].    Past Medical History:  Diagnosis Date  . Allergy    heyfever  . Arthritis   . Bruises easily   . Elevated platelet count (HCC)    on hydroxyurea  . GERD (gastroesophageal reflux disease)   . Heart murmur   . Hypertension   . Palpitation   . Skin cancer    removed from ear, scalp, and nose  . Thrombocytopenia ([place])   . Urgency of urination     Past Surgical History:  Procedure Laterality Date  . BONE MARROW TRANSPLANT    . COLONOSCOPY W/ POLYPECTOMY    . HERNIA REPAIR Right    inguinal  . INGUINAL HERNIA REPAIR Left 04/15/2015   Procedure: LEFT HERNIA REPAIR INGUINAL ADULT WITH MESH ;  Surgeon: Armandina Gemma, MD;  Location: [place];  Service: General;  Laterality: Left;  . INSERTION OF MESH Left 04/15/2015   Procedure: INSERTION OF MESH;  Surgeon: Armandina Gemma, MD;  Location: [place];  Service: General;  Laterality: Left;  . TOTAL HIP ARTHROPLASTY Left     Social History   Social History  . Marital status: Married    Spouse name: N/A  . Number of children: 3  . Years of education: N/A   Occupational History  . court reporter    Social History Main Topics  . Smoking status: Never Smoker  . Smokeless tobacco: Never Used  . Alcohol use No  . Drug use: No  . Sexual activity: Not on file   Other Topics Concern  . Not on file   Social History Narrative  . No narrative on file    Current Outpatient Prescriptions on File Prior to Visit  Medication Sig Dispense Refill  . calcium-vitamin D (CALCIUM 500/D) 500-200 MG-UNIT tablet Take 2 tablets by mouth 2 (two) times daily.    . carvedilol (COREG) 6.25 MG tablet Take 6.25 mg by mouth 2 (two) times daily with a meal.    .  clobetasol cream (TEMOVATE) 7.12 % Apply 1 application topically 2 (two) times daily as needed (rash).    . enoxaparin (LOVENOX) 40 MG/0.4ML injection Inject 40 mg into the skin daily.    Marland Kitchen erythromycin ophthalmic ointment 1 application at bedtime.    Marland Kitchen escitalopram (LEXAPRO) 10 MG tablet Take 10 mg by mouth daily.    . Fish Oil-Cholecalciferol (FISH OIL + D3 PO) Take by mouth.    . pantoprazole (PROTONIX) 40 MG tablet Take 40 mg by mouth daily.    . prednisoLONE (PRELONE) 15 MG/5ML syrup Take 15 mg by mouth 3 (three) times daily.    . predniSONE (DELTASONE) 10 MG tablet Take 1 tablet (10 mg total) by mouth daily with breakfast. 30 tablet 1  . valACYclovir (VALTREX) 500 MG tablet Take 1 tablet (500 mg total) by mouth daily. 30 tablet 0  . nystatin (MYCOSTATIN) 100000 UNIT/ML suspension Take 5 mLs by mouth 4 (four) times daily.    . tacrolimus (PROGRAF) 0.5 MG capsule Take 1 capsule (0.5 mg total) by mouth 2 (two) times daily. (Patient not taking: Reported on 10/13/2015) 60 capsule 1  . VESICARE 5 MG tablet Take 1 tablet by  mouth every morning.     No current facility-administered medications on file prior to visit.     No Known Allergies  Family History  Problem Relation Age of Onset  . Ovarian cancer    . Uterine cancer    . Colitis      crohns  . Cancer Mother   . Cancer Father   . Transient ischemic attack Sister     Aortic valve replacement  . Colon cancer Neg Hx     BP 136/64   Pulse 64   Temp 98.2 F (36.8 C) (Oral)   Ht '5\' 6"'$  (1.676 m)   Wt 159 lb (72.1 kg)   BMI 25.66 kg/m   Review of Systems he has slight wheezing.  Denies sob.      Objective:   Physical Exam VITAL SIGNS:  See vs page GENERAL: no distress LUNGS:  Clear to auscultation.    CXR: infiltrate and pl effs    Assessment & Plan:  Pneumonia, new.  Increase zithromax to 500/d.  Ret here next week Pl effs, new, uncertain etiology. Polyneuropathy, persistent

## 2015-10-13 NOTE — Telephone Encounter (Signed)
See message and please advise.  

## 2015-10-13 NOTE — Patient Instructions (Addendum)
A chest x-ray is requested for you today.  We'll let you know about the results.    Please continue the same medications.   Some specialists say that taking a high amount of folic acid (4-5 mg per day) helps the neuropathy.   I hope you feel better soon.  If you don't feel better by next week, please call back.  Please call sooner if you get worse.

## 2015-10-13 NOTE — Telephone Encounter (Signed)
Pt wife is aware of the xray results please call in the increase zithro to [facility] on [address]

## 2015-10-20 ENCOUNTER — Ambulatory Visit (INDEPENDENT_AMBULATORY_CARE_PROVIDER_SITE_OTHER): Payer: Medicare Other | Admitting: Endocrinology

## 2015-10-20 ENCOUNTER — Encounter: Payer: Self-pay | Admitting: Endocrinology

## 2015-10-20 VITALS — BP 104/54 | HR 71 | Ht 66.0 in | Wt 154.0 lb

## 2015-10-20 DIAGNOSIS — J189 Pneumonia, unspecified organism: Secondary | ICD-10-CM | POA: Diagnosis not present

## 2015-10-20 DIAGNOSIS — I5030 Unspecified diastolic (congestive) heart failure: Secondary | ICD-10-CM

## 2015-10-20 DIAGNOSIS — L97929 Non-pressure chronic ulcer of unspecified part of left lower leg with unspecified severity: Secondary | ICD-10-CM | POA: Insufficient documentation

## 2015-10-20 DIAGNOSIS — L97921 Non-pressure chronic ulcer of unspecified part of left lower leg limited to breakdown of skin: Secondary | ICD-10-CM | POA: Diagnosis not present

## 2015-10-20 NOTE — Progress Notes (Signed)
Subjective:    Patient ID: Thomas Cherry, male    DOB: [birth year], 70 y.o.   MRN: [id number]  HPI Pt was seen last week with pneumonia.Cough persists, but pt states overall, he feels better in general.  Pt states 1 week of moderate blister of the left leg, and assoc pain.  Past Medical History:  Diagnosis Date  . Allergy    heyfever  . Arthritis   . Bruises easily   . Elevated platelet count (HCC)    on hydroxyurea  . GERD (gastroesophageal reflux disease)   . Heart murmur   . Hypertension   . Palpitation   . Skin cancer    removed from ear, scalp, and nose  . Thrombocytopenia ([place])   . Urgency of urination     Past Surgical History:  Procedure Laterality Date  . BONE MARROW TRANSPLANT    . COLONOSCOPY W/ POLYPECTOMY    . HERNIA REPAIR Right    inguinal  . INGUINAL HERNIA REPAIR Left 04/15/2015   Procedure: LEFT HERNIA REPAIR INGUINAL ADULT WITH MESH ;  Surgeon: Armandina Gemma, MD;  Location: [place];  Service: General;  Laterality: Left;  . INSERTION OF MESH Left 04/15/2015   Procedure: INSERTION OF MESH;  Surgeon: Armandina Gemma, MD;  Location: [place];  Service: General;  Laterality: Left;  . TOTAL HIP ARTHROPLASTY Left     Social History   Social History  . Marital status: Married    Spouse name: N/A  . Number of children: 3  . Years of education: N/A   Occupational History  . court reporter    Social History Main Topics  . Smoking status: Never Smoker  . Smokeless tobacco: Never Used  . Alcohol use No  . Drug use: No  . Sexual activity: Not on file   Other Topics Concern  . Not on file   Social History Narrative  . No narrative on file    Current Outpatient Prescriptions on File Prior to Visit  Medication Sig Dispense Refill  . calcium-vitamin D (CALCIUM 500/D) 500-200 MG-UNIT tablet Take 2 tablets by mouth 2 (two) times daily.    . carvedilol (COREG) 6.25 MG tablet Take 6.25 mg by mouth 2 (two) times daily with a meal.    . clobetasol cream (TEMOVATE)  [id number] % Apply 1 application topically 2 (two) times daily as needed (rash).    . enoxaparin (LOVENOX) 40 MG/0.4ML injection Inject 40 mg into the skin daily.    Marland Kitchen erythromycin ophthalmic ointment 1 application at bedtime.    Marland Kitchen escitalopram (LEXAPRO) 10 MG tablet Take 10 mg by mouth daily.    . Fish Oil-Cholecalciferol (FISH OIL + D3 PO) Take by mouth.    . gabapentin (NEURONTIN) 300 MG capsule Take 300 mg by mouth 3 (three) times daily.    . Investigational - Study Medication Ixazamib    . nystatin (MYCOSTATIN) 100000 UNIT/ML suspension Take 5 mLs by mouth 4 (four) times daily.    . pantoprazole (PROTONIX) 40 MG tablet Take 40 mg by mouth daily.    . prednisoLONE (PRELONE) 15 MG/5ML syrup Take 15 mg by mouth 3 (three) times daily.    . predniSONE (DELTASONE) 10 MG tablet Take 1 tablet (10 mg total) by mouth daily with breakfast. 30 tablet 1  . tacrolimus (PROGRAF) 0.5 MG capsule Take 1 capsule (0.5 mg total) by mouth 2 (two) times daily. 60 capsule 1  . valACYclovir (VALTREX) 500 MG tablet Take 1 tablet (500 mg total)  by mouth daily. 30 tablet 0  . VESICARE 5 MG tablet Take 1 tablet by mouth every morning.     No current facility-administered medications on file prior to visit.     No Known Allergies  Family History  Problem Relation Age of Onset  . Ovarian cancer    . Uterine cancer    . Colitis      crohns  . Cancer Mother   . Cancer Father   . Transient ischemic attack Sister     Aortic valve replacement  . Colon cancer Neg Hx     BP (!) 104/54   Pulse 71   Ht 5\' 6"  (1.676 m)   Wt 154 lb (69.9 kg)   SpO2 94%   BMI 24.86 kg/m   Review of Systems Denies fever and sob.     Objective:   Physical Exam VITAL SIGNS:  See vs page.  GENERAL: no distress. Left leg: 11 cm diameter shallow ulcer anteriorly.  No surrounding erythema.  No drainage.   Ext: 1+ bilat leg edema.     Assessment & Plan:  Pneumonia, improved Edema/pl effusion.  Probable CHF, a he has h/o diastolic  dysfunction.  Leg ulcer, new.

## 2015-10-20 NOTE — Patient Instructions (Addendum)
You will get better much faster if you elevate your foot above the rest of your body.   Please see a wound care specialist.  you will receive a phone call, about a day and time for an appointment Please continue the same medications.   Please recheck the chest x-ray in 1 month, on the 1st floor.

## 2015-10-21 ENCOUNTER — Telehealth: Payer: Self-pay | Admitting: Endocrinology

## 2015-10-21 DIAGNOSIS — I5032 Chronic diastolic (congestive) heart failure: Secondary | ICD-10-CM | POA: Insufficient documentation

## 2015-10-21 NOTE — Telephone Encounter (Signed)
please call patient: I have changed ny mind.  Please do the echocardiogram, as Dr Caryl Comes did a few yeas back.  I need to make sure the heart is OK.  you will receive a phone call, about a day and time for an appointment

## 2015-10-21 NOTE — Telephone Encounter (Signed)
I contacted the patient's wife and advised of message. She voiced understanding and had not further questions at this time.

## 2015-11-08 ENCOUNTER — Encounter (HOSPITAL_BASED_OUTPATIENT_CLINIC_OR_DEPARTMENT_OTHER): Payer: Medicare Other

## 2015-12-06 ENCOUNTER — Encounter (HOSPITAL_COMMUNITY): Payer: Self-pay | Admitting: Emergency Medicine

## 2015-12-06 ENCOUNTER — Emergency Department (HOSPITAL_COMMUNITY): Payer: Medicare Other

## 2015-12-06 ENCOUNTER — Inpatient Hospital Stay (HOSPITAL_COMMUNITY)
Admission: EM | Admit: 2015-12-06 | Discharge: 2015-12-08 | DRG: 602 | Payer: Medicare Other | Attending: Nephrology | Admitting: Nephrology

## 2015-12-06 DIAGNOSIS — Z9484 Stem cells transplant status: Secondary | ICD-10-CM

## 2015-12-06 DIAGNOSIS — R0600 Dyspnea, unspecified: Secondary | ICD-10-CM

## 2015-12-06 DIAGNOSIS — G8929 Other chronic pain: Secondary | ICD-10-CM | POA: Diagnosis present

## 2015-12-06 DIAGNOSIS — D7581 Myelofibrosis: Secondary | ICD-10-CM | POA: Diagnosis present

## 2015-12-06 DIAGNOSIS — D473 Essential (hemorrhagic) thrombocythemia: Secondary | ICD-10-CM | POA: Diagnosis present

## 2015-12-06 DIAGNOSIS — L0390 Cellulitis, unspecified: Secondary | ICD-10-CM | POA: Diagnosis present

## 2015-12-06 DIAGNOSIS — L8990 Pressure ulcer of unspecified site, unspecified stage: Secondary | ICD-10-CM | POA: Diagnosis present

## 2015-12-06 DIAGNOSIS — D899 Disorder involving the immune mechanism, unspecified: Secondary | ICD-10-CM

## 2015-12-06 DIAGNOSIS — Z85828 Personal history of other malignant neoplasm of skin: Secondary | ICD-10-CM

## 2015-12-06 DIAGNOSIS — L89611 Pressure ulcer of right heel, stage 1: Secondary | ICD-10-CM | POA: Diagnosis present

## 2015-12-06 DIAGNOSIS — L89152 Pressure ulcer of sacral region, stage 2: Secondary | ICD-10-CM | POA: Diagnosis present

## 2015-12-06 DIAGNOSIS — N179 Acute kidney failure, unspecified: Secondary | ICD-10-CM

## 2015-12-06 DIAGNOSIS — F411 Generalized anxiety disorder: Secondary | ICD-10-CM

## 2015-12-06 DIAGNOSIS — D709 Neutropenia, unspecified: Secondary | ICD-10-CM | POA: Diagnosis present

## 2015-12-06 DIAGNOSIS — L03115 Cellulitis of right lower limb: Secondary | ICD-10-CM | POA: Diagnosis not present

## 2015-12-06 DIAGNOSIS — I5032 Chronic diastolic (congestive) heart failure: Secondary | ICD-10-CM | POA: Diagnosis present

## 2015-12-06 DIAGNOSIS — E876 Hypokalemia: Secondary | ICD-10-CM | POA: Diagnosis present

## 2015-12-06 DIAGNOSIS — Z809 Family history of malignant neoplasm, unspecified: Secondary | ICD-10-CM

## 2015-12-06 DIAGNOSIS — Z7952 Long term (current) use of systemic steroids: Secondary | ICD-10-CM

## 2015-12-06 DIAGNOSIS — I10 Essential (primary) hypertension: Secondary | ICD-10-CM | POA: Diagnosis present

## 2015-12-06 DIAGNOSIS — D849 Immunodeficiency, unspecified: Secondary | ICD-10-CM

## 2015-12-06 DIAGNOSIS — I130 Hypertensive heart and chronic kidney disease with heart failure and stage 1 through stage 4 chronic kidney disease, or unspecified chronic kidney disease: Secondary | ICD-10-CM | POA: Diagnosis present

## 2015-12-06 DIAGNOSIS — R7881 Bacteremia: Secondary | ICD-10-CM

## 2015-12-06 DIAGNOSIS — L89621 Pressure ulcer of left heel, stage 1: Secondary | ICD-10-CM | POA: Diagnosis present

## 2015-12-06 DIAGNOSIS — N183 Chronic kidney disease, stage 3 (moderate): Secondary | ICD-10-CM | POA: Diagnosis present

## 2015-12-06 DIAGNOSIS — N182 Chronic kidney disease, stage 2 (mild): Secondary | ICD-10-CM | POA: Diagnosis not present

## 2015-12-06 DIAGNOSIS — E039 Hypothyroidism, unspecified: Secondary | ICD-10-CM | POA: Diagnosis present

## 2015-12-06 DIAGNOSIS — D708 Other neutropenia: Secondary | ICD-10-CM

## 2015-12-06 DIAGNOSIS — D72819 Decreased white blood cell count, unspecified: Secondary | ICD-10-CM | POA: Diagnosis present

## 2015-12-06 DIAGNOSIS — Z9889 Other specified postprocedural states: Secondary | ICD-10-CM

## 2015-12-06 DIAGNOSIS — D61818 Other pancytopenia: Secondary | ICD-10-CM | POA: Diagnosis present

## 2015-12-06 DIAGNOSIS — L8941 Pressure ulcer of contiguous site of back, buttock and hip, stage 1: Secondary | ICD-10-CM | POA: Diagnosis present

## 2015-12-06 DIAGNOSIS — L89219 Pressure ulcer of right hip, unspecified stage: Secondary | ICD-10-CM

## 2015-12-06 DIAGNOSIS — Z79899 Other long term (current) drug therapy: Secondary | ICD-10-CM

## 2015-12-06 DIAGNOSIS — I459 Conduction disorder, unspecified: Secondary | ICD-10-CM | POA: Diagnosis present

## 2015-12-06 DIAGNOSIS — F418 Other specified anxiety disorders: Secondary | ICD-10-CM | POA: Diagnosis present

## 2015-12-06 DIAGNOSIS — K5901 Slow transit constipation: Secondary | ICD-10-CM

## 2015-12-06 DIAGNOSIS — B965 Pseudomonas (aeruginosa) (mallei) (pseudomallei) as the cause of diseases classified elsewhere: Secondary | ICD-10-CM

## 2015-12-06 DIAGNOSIS — Z7189 Other specified counseling: Secondary | ICD-10-CM

## 2015-12-06 DIAGNOSIS — N170 Acute kidney failure with tubular necrosis: Secondary | ICD-10-CM | POA: Diagnosis present

## 2015-12-06 DIAGNOSIS — J90 Pleural effusion, not elsewhere classified: Secondary | ICD-10-CM | POA: Diagnosis not present

## 2015-12-06 DIAGNOSIS — Z96641 Presence of right artificial hip joint: Secondary | ICD-10-CM | POA: Diagnosis present

## 2015-12-06 DIAGNOSIS — Z515 Encounter for palliative care: Secondary | ICD-10-CM

## 2015-12-06 DIAGNOSIS — K219 Gastro-esophageal reflux disease without esophagitis: Secondary | ICD-10-CM | POA: Diagnosis present

## 2015-12-06 DIAGNOSIS — D89813 Graft-versus-host disease, unspecified: Secondary | ICD-10-CM | POA: Diagnosis present

## 2015-12-06 LAB — URINALYSIS, ROUTINE W REFLEX MICROSCOPIC
Glucose, UA: NEGATIVE mg/dL
Hgb urine dipstick: NEGATIVE
KETONES UR: NEGATIVE mg/dL
LEUKOCYTES UA: NEGATIVE
NITRITE: NEGATIVE
PROTEIN: 30 mg/dL — AB
Specific Gravity, Urine: 1.023 (ref 1.005–1.030)
pH: 5.5 (ref 5.0–8.0)

## 2015-12-06 LAB — CBC WITH DIFFERENTIAL/PLATELET
Basophils Absolute: 0 10*3/uL (ref 0.0–0.1)
Basophils Relative: 0 %
EOS PCT: 1 %
Eosinophils Absolute: 0 10*3/uL (ref 0.0–0.7)
HEMATOCRIT: 27.1 % — AB (ref 39.0–52.0)
HEMOGLOBIN: 8.8 g/dL — AB (ref 13.0–17.0)
LYMPHS PCT: 28 %
Lymphs Abs: 0.6 10*3/uL — ABNORMAL LOW (ref 0.7–4.0)
MCH: 31.1 pg (ref 26.0–34.0)
MCHC: 32.5 g/dL (ref 30.0–36.0)
MCV: 95.8 fL (ref 78.0–100.0)
MONOS PCT: 27 %
Monocytes Absolute: 0.6 10*3/uL (ref 0.1–1.0)
NEUTROS PCT: 44 %
Neutro Abs: 1.1 10*3/uL — ABNORMAL LOW (ref 1.7–7.7)
Platelets: 57 10*3/uL — ABNORMAL LOW (ref 150–400)
RBC: 2.83 MIL/uL — AB (ref 4.22–5.81)
RDW: 15.9 % — ABNORMAL HIGH (ref 11.5–15.5)
WBC: 2.3 10*3/uL — AB (ref 4.0–10.5)

## 2015-12-06 LAB — HEPATIC FUNCTION PANEL
ALK PHOS: 79 U/L (ref 38–126)
ALT: 11 U/L — AB (ref 17–63)
AST: 15 U/L (ref 15–41)
Albumin: 2.8 g/dL — ABNORMAL LOW (ref 3.5–5.0)
BILIRUBIN DIRECT: 0.1 mg/dL (ref 0.1–0.5)
BILIRUBIN INDIRECT: 0.4 mg/dL (ref 0.3–0.9)
BILIRUBIN TOTAL: 0.5 mg/dL (ref 0.3–1.2)
TOTAL PROTEIN: 5 g/dL — AB (ref 6.5–8.1)

## 2015-12-06 LAB — BASIC METABOLIC PANEL
Anion gap: 8 (ref 5–15)
BUN: 39 mg/dL — AB (ref 6–20)
CHLORIDE: 102 mmol/L (ref 101–111)
CO2: 27 mmol/L (ref 22–32)
Calcium: 8.5 mg/dL — ABNORMAL LOW (ref 8.9–10.3)
Creatinine, Ser: 1.63 mg/dL — ABNORMAL HIGH (ref 0.61–1.24)
GFR calc non Af Amer: 41 mL/min — ABNORMAL LOW (ref >60)
GFR, EST AFRICAN AMERICAN: 47 mL/min — AB (ref >60)
Glucose, Bld: 101 mg/dL — ABNORMAL HIGH (ref 65–99)
POTASSIUM: 3.2 mmol/L — AB (ref 3.5–5.1)
SODIUM: 137 mmol/L (ref 135–145)

## 2015-12-06 LAB — URINE MICROSCOPIC-ADD ON

## 2015-12-06 LAB — MAGNESIUM: Magnesium: 2.3 mg/dL (ref 1.7–2.4)

## 2015-12-06 LAB — I-STAT CG4 LACTIC ACID, ED: Lactic Acid, Venous: 0.97 mmol/L (ref 0.5–1.9)

## 2015-12-06 MED ORDER — SODIUM CHLORIDE 0.9 % IV SOLN
INTRAVENOUS | Status: AC
  Administered 2015-12-06: 23:00:00 via INTRAVENOUS

## 2015-12-06 MED ORDER — ONDANSETRON HCL 4 MG/2ML IJ SOLN: 4.0000 mg | Freq: Four times a day (QID) | INTRAMUSCULAR | Status: DC | PRN

## 2015-12-06 MED ORDER — SENNOSIDES-DOCUSATE SODIUM 8.6-50 MG PO TABS: 1.0000 | ORAL_TABLET | Freq: Two times a day (BID) | ORAL | Status: DC | PRN

## 2015-12-06 MED ORDER — VALACYCLOVIR HCL 500 MG PO TABS
500.0000 mg | ORAL_TABLET | Freq: Every day | ORAL | Status: DC
  Administered 2015-12-07 – 2015-12-08 (×2): 500 mg via ORAL
  Filled 2015-12-06 (×2): qty 1

## 2015-12-06 MED ORDER — ERYTHROMYCIN 5 MG/GM OP OINT
1.0000 "application " | TOPICAL_OINTMENT | Freq: Every day | OPHTHALMIC | Status: DC
  Administered 2015-12-07: 1 via OPHTHALMIC
  Filled 2015-12-06: qty 3.5

## 2015-12-06 MED ORDER — SODIUM CHLORIDE 0.9 % IV SOLN: 250.0000 mL | INTRAVENOUS | Status: DC | PRN

## 2015-12-06 MED ORDER — SODIUM CHLORIDE 0.9% FLUSH
3.0000 mL | INTRAVENOUS | Status: DC | PRN
Start: 2015-12-06 — End: 2015-12-08

## 2015-12-06 MED ORDER — ONDANSETRON HCL 4 MG PO TABS: 4.0000 mg | ORAL_TABLET | Freq: Four times a day (QID) | ORAL | Status: DC | PRN

## 2015-12-06 MED ORDER — CARVEDILOL 6.25 MG PO TABS
6.2500 mg | ORAL_TABLET | Freq: Two times a day (BID) | ORAL | Status: DC
  Administered 2015-12-07 – 2015-12-08 (×3): 6.25 mg via ORAL
  Filled 2015-12-06 (×5): qty 1

## 2015-12-06 MED ORDER — CEFAZOLIN IN D5W 1 GM/50ML IV SOLN
1.0000 g | Freq: Three times a day (TID) | INTRAVENOUS | Status: DC
  Administered 2015-12-07 (×3): 1 g via INTRAVENOUS
  Filled 2015-12-06 (×5): qty 50

## 2015-12-06 MED ORDER — SODIUM CHLORIDE 0.9% FLUSH: 3.0000 mL | Freq: Two times a day (BID) | INTRAVENOUS | Status: DC

## 2015-12-06 MED ORDER — VANCOMYCIN HCL IN DEXTROSE 1-5 GM/200ML-% IV SOLN
1000.0000 mg | Freq: Once | INTRAVENOUS | Status: AC
  Administered 2015-12-06: 1000 mg via INTRAVENOUS
  Filled 2015-12-06: qty 200

## 2015-12-06 MED ORDER — ESCITALOPRAM OXALATE 10 MG PO TABS
10.0000 mg | ORAL_TABLET | Freq: Two times a day (BID) | ORAL | Status: DC
  Administered 2015-12-07 – 2015-12-08 (×3): 10 mg via ORAL
  Filled 2015-12-06 (×3): qty 1

## 2015-12-06 MED ORDER — BISACODYL 5 MG PO TBEC
5.0000 mg | DELAYED_RELEASE_TABLET | Freq: Every day | ORAL | Status: DC | PRN
  Filled 2015-12-06: qty 1

## 2015-12-06 MED ORDER — POTASSIUM CHLORIDE CRYS ER 20 MEQ PO TBCR
30.0000 meq | EXTENDED_RELEASE_TABLET | Freq: Once | ORAL | Status: AC
  Administered 2015-12-07: 30 meq via ORAL
  Filled 2015-12-06: qty 1

## 2015-12-06 MED ORDER — ACETAMINOPHEN 650 MG RE SUPP: 650.0000 mg | Freq: Four times a day (QID) | RECTAL | Status: DC | PRN

## 2015-12-06 MED ORDER — ACETAMINOPHEN 325 MG PO TABS: 650.0000 mg | ORAL_TABLET | Freq: Four times a day (QID) | ORAL | Status: DC | PRN

## 2015-12-06 MED ORDER — PANTOPRAZOLE SODIUM 40 MG PO TBEC
40.0000 mg | DELAYED_RELEASE_TABLET | Freq: Every day | ORAL | Status: DC
  Administered 2015-12-07 – 2015-12-08 (×2): 40 mg via ORAL
  Filled 2015-12-06 (×2): qty 1

## 2015-12-06 MED ORDER — HYDROCODONE-ACETAMINOPHEN 5-325 MG PO TABS: 1.0000 | ORAL_TABLET | ORAL | Status: DC | PRN

## 2015-12-06 MED ORDER — CALCIUM CARBONATE-VITAMIN D 500-200 MG-UNIT PO TABS
1.0000 | ORAL_TABLET | Freq: Four times a day (QID) | ORAL | Status: DC
  Administered 2015-12-07 – 2015-12-08 (×8): 1 via ORAL
  Filled 2015-12-06 (×8): qty 1

## 2015-12-06 MED ORDER — OMEGA-3-ACID ETHYL ESTERS 1 G PO CAPS
1.0000 g | ORAL_CAPSULE | Freq: Every day | ORAL | Status: DC
  Administered 2015-12-07 – 2015-12-08 (×2): 1 g via ORAL
  Filled 2015-12-06 (×2): qty 1

## 2015-12-06 MED ORDER — PREDNISONE 5 MG PO TABS
5.0000 mg | ORAL_TABLET | Freq: Every day | ORAL | Status: DC
Start: 2015-12-07 — End: 2015-12-08
  Administered 2015-12-07 – 2015-12-08 (×2): 5 mg via ORAL
  Filled 2015-12-06 (×2): qty 1

## 2015-12-06 NOTE — ED Notes (Signed)
Bed: [id number] Expected date:  Expected time:  Means of arrival:  Comments: EMS-decub

## 2015-12-06 NOTE — ED Provider Notes (Signed)
[facility] DEPT Provider Note   CSN: [id number] Arrival date & time: 12/06/15  1831     History   Chief Complaint Chief Complaint  Patient presents with  . pressure sore pain    HPI Thomas Cherry is a 70 y.o. male.  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  He has also developed generalized weakness, decreased PO intake, SOB, and fatigue.  He was in the bathroom earlier today when he became weak and slid down the wall.  Denies any injury from this ,denies head injury or LOC.      Past Medical History:  Diagnosis Date  . Allergy    heyfever  . Arthritis   . Bruises easily   . Elevated platelet count (HCC)    on hydroxyurea  . GERD (gastroesophageal reflux disease)   . Heart murmur   . Hypertension   . Palpitation   . Skin cancer    removed from ear, scalp, and nose  . Thrombocytopenia ([place])   . Urgency of urination     Patient Active Problem List   Diagnosis Date Noted  . Cellulitis 12/06/2015  . CHF with left ventricular diastolic dysfunction, NYHA class 1 ([place]) 10/21/2015  . Leg ulcer, left ([place]) 10/20/2015  . Pneumonia 10/20/2015  . Cough 10/13/2015  . Hypothyroidism 07/13/2015  . Hypocalcemia 07/13/2015  . Left inguinal hernia 04/13/2015  . UTI (lower urinary tract infection)   . Orthostasis   . Debility 03/18/2015  . 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 Skin tear of elbow without complication [id number]  . Hip dislocation, left ([place]) 01/23/2015  . Myelofibrosis ([place]) 06/17/2013  . Nonspecific abnormal electrocardiogram (ECG) (EKG) 03/26/2013  . Seborrhea 03/24/2012  . Palpitation   . Essential hypertension   . Myeloproliferative  disease ([place]) 12/28/2010  . Essential thrombocythemia ([place]) 12/28/2010  . Rash 11/24/2010  . Toe pain, left 11/16/2010  . Thrombocytosis ([place]) 11/16/2010  . Personal history of colonic polyps 06/14/2010  . Screening for prostate cancer 06/02/2010  . Pure hypercholesterolemia 06/02/2010  . Inguinal hernia 06/02/2010  . Encounter for long-term (current) use of other medications 06/02/2010  . CERUMEN IMPACTION, RIGHT 03/26/2008  . GERD 10/11/2006    Past Surgical History:  Procedure Laterality Date  . BONE MARROW TRANSPLANT    . COLONOSCOPY W/ POLYPECTOMY    . HERNIA REPAIR Right    inguinal  . INGUINAL HERNIA REPAIR Left 04/15/2015   Procedure: LEFT HERNIA REPAIR INGUINAL ADULT WITH MESH ;  Surgeon: Armandina Gemma, MD;  Location: [facility];  Service: General;  Laterality: Left;  . INSERTION OF MESH Left 04/15/2015   Procedure: INSERTION OF MESH;  Surgeon: Armandina Gemma, MD;  Location: [facility];  Service: General;  Laterality: Left;  . TOTAL HIP ARTHROPLASTY Left        Home Medications    Prior to Admission medications   Medication Sig Start Date End Date Taking? Authorizing Provider  calcium-vitamin D (CALCIUM 500/D) 500-200 MG-UNIT tablet Take 1 tablet by mouth 4 (four) times daily.  01/04/15 Jan 29, 2016 Yes Historical Provider, MD  carvedilol (COREG) 6.25 MG tablet Take 6.25 mg by mouth 2 (two) times daily with a meal.   Yes Historical Provider, MD  enoxaparin (LOVENOX) 40 MG/0.4ML  injection Inject 40 mg into the skin every evening.    Yes Historical Provider, MD  erythromycin ophthalmic ointment 1 application at bedtime.   Yes Historical Provider, MD  escitalopram (LEXAPRO) 10 MG tablet Take 10 mg by mouth 2 (two) times daily.   Yes Historical Provider, MD  Fish Oil-Cholecalciferol (FISH OIL + D3 PO) Take 1 capsule by mouth daily.    Yes Historical Provider, MD  pantoprazole (PROTONIX) 40 MG tablet Take 40 mg by mouth daily.   Yes Historical Provider, MD  predniSONE (DELTASONE) 5 MG tablet  Take 5 mg by mouth daily with breakfast.   Yes Historical Provider, MD  senna-docusate (SENOKOT-S) 8.6-50 MG tablet Take 1 tablet by mouth 2 (two) times daily as needed for mild constipation.   Yes Historical Provider, MD  valACYclovir (VALTREX) 500 MG tablet Take 1 tablet (500 mg total) by mouth daily. 03/25/15  Yes Daniel J Angiulli, PA-C  clobetasol cream (TEMOVATE) [id number] % Apply 1 application topically 2 (two) times daily as needed (rash).    Historical Provider, MD  predniSONE (DELTASONE) 10 MG tablet Take 1 tablet (10 mg total) by mouth daily with breakfast. Patient not taking: Reported on 12/06/2015 03/25/15   Lavon Paganini Angiulli, PA-C  tacrolimus (PROGRAF) 0.5 MG capsule Take 1 capsule (0.5 mg total) by mouth 2 (two) times daily. Patient not taking: Reported on 12/06/2015 03/26/15   Lavon Paganini Angiulli, PA-C    Family History Family History  Problem Relation Age of Onset  . Cancer Mother   . Cancer Father   . Ovarian cancer    . Uterine cancer    . Colitis      crohns  . Transient ischemic attack Sister     Aortic valve replacement  . Colon cancer Neg Hx     Social History Social History  Substance Use Topics  . Smoking status: Never Smoker  . Smokeless tobacco: Never Used  . Alcohol use No     Allergies   Review of patient's allergies indicates no known allergies.   Review of Systems Review of Systems  All other systems reviewed and are negative.    Physical Exam Updated Vital Signs BP 132/88 (BP Location: Left Arm)   Pulse 70   Temp 98.7 F (37.1 C) (Oral)   Resp 15   SpO2 96%   Physical Exam  Constitutional: No distress.  Frail, pale   HENT:  Head: Normocephalic and atraumatic.  Neck: Neck supple.  Cardiovascular: Normal rate and regular rhythm.   Pulmonary/Chest: Effort normal. No respiratory distress. He has decreased breath sounds. He has no wheezes. He has rales.  Abdominal: Soft. He exhibits no distension and no mass. There is no tenderness. There  is no rebound and no guarding.  Neurological: He is alert. He exhibits normal muscle tone.  Skin: He is not diaphoretic.  Multiple decubitus ulcers over legs and buttocks.  Right posterior thigh and calf with dark eschar with surrounding erythema, tender to palpation.  No discharge.  No fluctuance or induration.    Nursing note and vitals reviewed.    ED Treatments / Results  Labs (all labs ordered are listed, but only abnormal results are displayed) Labs Reviewed  BASIC METABOLIC PANEL - Abnormal; Notable for the following:       Result Value   Potassium 3.2 (*)    Glucose, Bld 101 (*)    BUN 39 (*)    Creatinine, Ser 1.63 (*)    Calcium 8.5 (*)  GFR calc non Af Amer 41 (*)    GFR calc Af Amer 47 (*)    All other components within normal limits  CBC WITH DIFFERENTIAL/PLATELET - Abnormal; Notable for the following:    WBC 2.3 (*)    RBC 2.83 (*)    Hemoglobin 8.8 (*)    HCT 27.1 (*)    RDW 15.9 (*)    Platelets 57 (*)    Neutro Abs 1.1 (*)    Lymphs Abs 0.6 (*)    All other components within normal limits  URINALYSIS, ROUTINE W REFLEX MICROSCOPIC (NOT AT [facility]) - Abnormal; Notable for the following:    Color, Urine AMBER (*)    APPearance CLOUDY (*)    Bilirubin Urine SMALL (*)    Protein, ur 30 (*)    All other components within normal limits  URINE MICROSCOPIC-ADD ON - Abnormal; Notable for the following:    Squamous Epithelial / LPF 0-5 (*)    Bacteria, UA RARE (*)    Casts GRANULAR CAST (*)    All other components within normal limits  CULTURE, BLOOD (ROUTINE X 2)  CULTURE, BLOOD (ROUTINE X 2)  URINE CULTURE  HEPATIC FUNCTION PANEL  I-STAT CG4 LACTIC ACID, ED    EKG  EKG Interpretation None       Radiology Dg Chest Portable 1 View  Result Date: 12/06/2015 CLINICAL DATA:  70 y/o  M; shortness of breath and weakness today. EXAM: PORTABLE CHEST 1 VIEW COMPARISON:  10/13/2015 chest radiograph FINDINGS: Enlarged cardiac silhouette a globular appearance,  possibly accentuated technique. Right port catheter tip projects over lower SVC. Small bilateral pleural effusions, right greater than left and associated bibasilar opacities. Pulmonary vascular congestion. No acute osseous abnormality is evident. IMPRESSION: Right greater than left small effusions and bibasilar opacities which probably represent associated atelectasis. Pulmonary vascular congestion. Enlarged cardiac silhouette with a globular appearance, question pericardial effusion. Electronically Signed   By: Kristine Garbe M.D.   On: 12/06/2015 20:54    Procedures Procedures (including critical care time)  Medications Ordered in ED Medications  vancomycin (VANCOCIN) IVPB 1000 mg/200 mL premix (1,000 mg Intravenous New Bag/Given 12/06/15 2034)     Initial Impression / Assessment and Plan / ED Course  I have reviewed the triage vital signs and the nursing notes.  Pertinent labs & imaging results that were available during my care of the patient were reviewed by me and considered in my medical decision making (see chart for details).  Clinical Course   Immunocompromised patient with infected bed sore on right leg.  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  Has multiple bedsores - change in appearance and new pain associated with sore on the back of his right leg today.  Has also had increased fatigue, SOB, decreased appetite.  Treated empirically with vancomycin.  No purulent discharge or apparent abscess to swab for culture.  Blood and urine cultures sent.  Pt admitted to [facility], Dr Myna Hidalgo accepting.     Final Clinical Impressions(s) / ED Diagnoses   Final diagnoses:  Cellulitis of right lower extremity  Immunocompromised patient Benjy Kana [facility])  Pleural effusion    New Prescriptions New Prescriptions   No medications on file     Clayton Bibles, PA-C 12/06/15 2141    Virgel Manifold,  MD 12/07/15 1438

## 2015-12-06 NOTE — ED Notes (Signed)
Hospitalist at bedside 

## 2015-12-06 NOTE — ED Triage Notes (Signed)
Per EMS patient from home c/o pain in pressure sore areas 1. Buttock, 2. Right hip.  Patient had right hip surgery in September where he went to [facility] for rehab afterwards, while there he developed the pressure sores on the two areas noted above.  Patient is very weak and ambulatory only very short distances per EMS.

## 2015-12-06 NOTE — H&P (Signed)
History and Physical    CANDLER GINSBERG [id number] DOB: September 21, 1945 DOA: 12/06/2015  PCP: Renato Shin, MD   Patient coming from: Home  Chief Complaint: Pressure sores with increased pain, surrounding erythema, and generalized weakness  HPI: Thomas Cherry is a 70 y.o. 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 Patient has been on chronic prednisone for his myelofibrosis and underwent right hip replacement last month. Unfortunately, he developed pressure sores at the buttock and posterior right leg. Over the past several days, the patient has noted increasingly severe pain at the site of these sores and reports development of surrounding erythema. Over the same interval, he has developed generalized weakness with poor appetite and malaise. He denies fevers or chills, denies chest pain or palpitations, and denies headache, change in vision or hearing, or focal numbness or weakness. He has not been on antibiotics recently and denies any known allergy to antibiotics.  ED Course: Upon arrival to the ED, patient is found to be afebrile, saturating well on room air, and with vital signs stable. EKG demonstrates a sinus rhythm with nonspecific intraventricular conduction delay and chest x-ray is notable for small bilateral pleural effusions and bibasilar opacities which likely represent atelectasis. Also noted on chest x-ray is pulmonary vascular congestion. Chemistry panels notable for potassium of 3.2, BUN of 39, and serum creatinine 1.63, up from 1.3 in February of this year. CBC features a pancytopenia with WBC of 2300, hemoglobin 8.8, and platelets of 57,000 with a mild left shift. Urinalysis is notable for granular casts and lactic acid is reassuring at  0.97. Blood and urine cultures were obtained in the emergency department and the patient was treated with empiric vancomycin. He remained hemodynamically stable in the ED and in no respiratory distress and will be observed on the medical surgical unit for ongoing evaluation and management of pressure wounds with surrounding cellulitis.  Review of Systems:  All other systems reviewed and apart from HPI, are negative.  Past Medical History:  Diagnosis Date  . Allergy    heyfever  . Arthritis   . Bruises easily   . Elevated platelet count (HCC)    on hydroxyurea  . GERD (gastroesophageal reflux disease)   . Heart murmur   . Hypertension   . Palpitation   . Skin cancer    removed from ear, scalp, and nose  . Thrombocytopenia ([place])   . Urgency of urination     Past Surgical History:  Procedure Laterality Date  . BONE MARROW TRANSPLANT    . COLONOSCOPY W/ POLYPECTOMY    . HERNIA REPAIR Right    inguinal  . INGUINAL HERNIA REPAIR Left 04/15/2015   Procedure: LEFT HERNIA REPAIR INGUINAL ADULT WITH MESH ;  Surgeon: Armandina Gemma, MD;  Location: [facility];  Service: General;  Laterality: Left;  . INSERTION OF MESH Left 04/15/2015   Procedure: INSERTION OF MESH;  Surgeon: Armandina Gemma, MD;  Location: [facility];  Service: General;  Laterality: Left;  . TOTAL HIP ARTHROPLASTY Left      reports that he has never smoked. He has never used smokeless tobacco. He reports that he does not drink alcohol or use drugs.  No Known Allergies  Family History  Problem Relation Age of Onset  . Cancer Mother   . Cancer Father   . Ovarian  cancer    . Uterine cancer    . Colitis      crohns  . Transient ischemic attack Sister     Aortic valve replacement  . Colon cancer Neg Hx      Prior to Admission medications   Medication Sig Start Date End Date Taking? Authorizing Provider  calcium-vitamin D (CALCIUM 500/D) 500-200 MG-UNIT tablet Take 1 tablet by mouth 4 (four) times daily.  01/04/15 01-19-16 Yes  Historical Provider, MD  carvedilol (COREG) 6.25 MG tablet Take 6.25 mg by mouth 2 (two) times daily with a meal.   Yes Historical Provider, MD  enoxaparin (LOVENOX) 40 MG/0.4ML injection Inject 40 mg into the skin every evening.    Yes Historical Provider, MD  erythromycin ophthalmic ointment 1 application at bedtime.   Yes Historical Provider, MD  escitalopram (LEXAPRO) 10 MG tablet Take 10 mg by mouth 2 (two) times daily.   Yes Historical Provider, MD  Fish Oil-Cholecalciferol (FISH OIL + D3 PO) Take 1 capsule by mouth daily.    Yes Historical Provider, MD  pantoprazole (PROTONIX) 40 MG tablet Take 40 mg by mouth daily.   Yes Historical Provider, MD  predniSONE (DELTASONE) 5 MG tablet Take 5 mg by mouth daily with breakfast.   Yes Historical Provider, MD  senna-docusate (SENOKOT-S) 8.6-50 MG tablet Take 1 tablet by mouth 2 (two) times daily as needed for mild constipation.   Yes Historical Provider, MD  valACYclovir (VALTREX) 500 MG tablet Take 1 tablet (500 mg total) by mouth daily. 03/25/15  Yes Daniel J Angiulli, PA-C  clobetasol cream (TEMOVATE) 1.61 % Apply 1 application topically 2 (two) times daily as needed (rash).    Historical Provider, MD  predniSONE (DELTASONE) 10 MG tablet Take 1 tablet (10 mg total) by mouth daily with breakfast. Patient not taking: Reported on 12/06/2015 03/25/15   Lavon Paganini Angiulli, PA-C  tacrolimus (PROGRAF) 0.5 MG capsule Take 1 capsule (0.5 mg total) by mouth 2 (two) times daily. Patient not taking: Reported on 12/06/2015 03/26/15   Cathlyn Parsons, PA-C    Physical Exam: Vitals:   12/06/15 1839 12/06/15 1844 12/06/15 2116  BP:  119/73 132/88  Pulse:  74 70  Resp:  18 15  Temp:  98.7 F (37.1 C)   TempSrc:  Oral   SpO2: 96% 95% 96%      Constitutional: NAD, calm, comfortable, cachectic, pale  Eyes: PERTLA, lids and conjunctivae normal ENMT: Mucous membranes are moist. Posterior pharynx clear of any exudate or lesions.   Neck: normal, supple, no  masses, no thyromegaly Respiratory: clear to auscultation bilaterally, no wheezing, no crackles. Normal respiratory effort.    Cardiovascular: S1 & S2 heard, regular rate and rhythm. No extremity edema. No significant JVD. Abdomen: No distension, no tenderness, no masses palpated. Bowel sounds normal.  Musculoskeletal: no clubbing / cyanosis. No joint deformity upper and lower extremities.   Skin: Wounds at posterior right thigh with crust, tenderness, surrounding erythema and induration, no drainage or fluctuance. Sores noted about the toes bilaterally, left > right. Skin is otherwise pale and markedly xerotic and atrophic with small scattered skin tears.  Neurologic: CN 2-12 grossly intact. Sensation intact, DTR normal. Strength 5/5 in all 4 limbs.  Psychiatric: Normal judgment and insight. Alert and oriented x 3. Normal mood and affect.     Labs on Admission: I have personally reviewed following labs and imaging studies  CBC:  Recent Labs Lab 12/06/15 2024  WBC 2.3*  NEUTROABS 1.1*  HGB  8.8*  HCT 27.1*  MCV 95.8  PLT 57*   Basic Metabolic Panel:  Recent Labs Lab 12/06/15 2024  NA 137  K 3.2*  CL 102  CO2 27  GLUCOSE 101*  BUN 39*  CREATININE 1.63*  CALCIUM 8.5*   GFR: CrCl cannot be calculated (Unknown ideal weight.). Liver Function Tests:  Recent Labs Lab 12/06/15 2024  AST 15  ALT 11*  ALKPHOS 79  BILITOT 0.5  PROT 5.0*  ALBUMIN 2.8*   No results for input(s): LIPASE, AMYLASE in the last 168 hours. No results for input(s): AMMONIA in the last 168 hours. Coagulation Profile: No results for input(s): INR, PROTIME in the last 168 hours. Cardiac Enzymes: No results for input(s): CKTOTAL, CKMB, CKMBINDEX, TROPONINI in the last 168 hours. BNP (last 3 results) No results for input(s): PROBNP in the last 8760 hours. HbA1C: No results for input(s): HGBA1C in the last 72 hours. CBG: No results for input(s): GLUCAP in the last 168 hours. Lipid Profile: No  results for input(s): CHOL, HDL, LDLCALC, TRIG, CHOLHDL, LDLDIRECT in the last 72 hours. Thyroid Function Tests: No results for input(s): TSH, T4TOTAL, FREET4, T3FREE, THYROIDAB in the last 72 hours. Anemia Panel: No results for input(s): VITAMINB12, FOLATE, FERRITIN, TIBC, IRON, RETICCTPCT in the last 72 hours. Urine analysis:    Component Value Date/Time   COLORURINE AMBER (A) 12/06/2015 1958   APPEARANCEUR CLOUDY (A) 12/06/2015 1958   LABSPEC 1.023 12/06/2015 1958   PHURINE 5.5 12/06/2015 1958   GLUCOSEU NEGATIVE 12/06/2015 1958   GLUCOSEU NEGATIVE 03/24/2012 1126   HGBUR NEGATIVE 12/06/2015 1958   BILIRUBINUR SMALL (A) 12/06/2015 [place] NEGATIVE 12/06/2015 1958   PROTEINUR 30 (A) 12/06/2015 1958   UROBILINOGEN 0.2 10/09/2014 0113   NITRITE NEGATIVE 12/06/2015 1958   LEUKOCYTESUR NEGATIVE 12/06/2015 1958   Sepsis Labs: _0 (procalcitonin:4,lacticidven:4) )No results found for this or any previous visit (from the past 240 hour(s)).   Radiological Exams on Admission: Dg Chest Portable 1 View  Result Date: 12/06/2015 CLINICAL DATA:  70 y/o  M; shortness of breath and weakness today. EXAM: PORTABLE CHEST 1 VIEW COMPARISON:  10/13/2015 chest radiograph FINDINGS: Enlarged cardiac silhouette a globular appearance, possibly accentuated technique. Right port catheter tip projects over lower SVC. Small bilateral pleural effusions, right greater than left and associated bibasilar opacities. Pulmonary vascular congestion. No acute osseous abnormality is evident. IMPRESSION: Right greater than left small effusions and bibasilar opacities which probably represent associated atelectasis. Pulmonary vascular congestion. Enlarged cardiac silhouette with a globular appearance, question pericardial effusion. Electronically Signed   By: Kristine Garbe M.D.   On: 12/06/2015 20:54    EKG: Independently reviewed. Sinus rhythm, non-specific IVCD   Assessment/Plan  1. Pressure  sores with surrounding cellulitis  - Pressure sores at buttock, posterior right leg, and toes; present on arrival  - There appears to be a secondary cellulitis surrounding the sores at posterior right thigh  - There is a mild left-shift noted on CBC and lactic acid reassuring at 0.97; CRP and ESR pending  - Blood and urine cultures obtained in ED and empiric vancomycin was administered  - There is no purulence, sepsis, or hx of infection with MDR organisms; will continue empiric treatment with Ancef  - Wound care consultation requested    2. Myelofibrosis, pancytopenia  - Followed by Dr. Julien Nordmann of oncology and receiving care through [facility], currently managed with daily prednisone 5 mg qD  - CBC fairly stable relative to recent labs in [place]; there  is a mild neutropenia on admission with ANC 1,100  - No s/s of bleeding; will avoid pharmacologic VTE ppx for now given platelets of 57k    3. AKI superimposed on CKD stage III  - SCr 1.63 on admission, up from just over 1 on recent labs in [place]  - Likely a prerenal azotemia in setting of acute infection and poor appetite; ATN possible, granular cast noted on UA - Will provide a very gentle hydration overnight with NS, mindful of his chronic diastolic CHF and congestive changes on CXR  - Repeat chem panel in am   4. Hypokalemia  - Serum potassium 3.2 on admission  - Given 30 mEq PO potassium on admission  - Mag level added-on to chem panel and pending; replete prn  - Repeat chem panel in am    5. Chronic diastolic CHF - Appears dry clinically, but congestive changes noted on admission CXR  - TTE (01/30/2011) with EF 55-60%, mild LAE, grade 2 diastolic dysfunction, no significant  - Providing a very gentle IVF hydration overnight in setting of AKI suspected to be prerenal  - Follow daily wts and I/O's    6. Hypertension  - At goal currently, continue Coreg as tolerated    7. Depression, anxiety  - Appears to be stable    - Continue Lexapro     DVT prophylaxis: SCD's  Code Status: Full  Family Communication: Wife updated at bedside Disposition Plan: Observe on med-surg Consults called: None Admission status: Observation    Vianne Bulls, MD [facility] Pager [phone]  If 7PM-7AM, please contact night-coverage [email or web address] Password [email or web address]  12/06/2015, 10:14 PM

## 2015-12-06 NOTE — ED Triage Notes (Signed)
When asking patient while he came to Ed today, patient responds, " I don't know, I was resting in my bed and my wife told me that EMS was coming to get me."  Patient denies any pain at this time and doesn't remember c/o pain to EMS.  Patient states that he has walker at home that uses for ambulation assistance but also reports that he needs help getting up to the walk.

## 2015-12-06 NOTE — ED Notes (Signed)
Patient wife here now, stating that patient pressure sore on posterior leg is possibly infected and she was going to take him to urgent care earlier to get cultured, but patient was in bathroom at house and fell due to weakness.  Wife reports that she got him back into the bed where he started having agonal breathing.  So she called 911.  Reports that patient started on Clonopin yesterday and had first dose last night and then again today.   Patient is bone marrow patient.

## 2015-12-07 ENCOUNTER — Ambulatory Visit: Payer: Medicare Other | Admitting: Endocrinology

## 2015-12-07 DIAGNOSIS — K5901 Slow transit constipation: Secondary | ICD-10-CM

## 2015-12-07 DIAGNOSIS — N182 Chronic kidney disease, stage 2 (mild): Secondary | ICD-10-CM | POA: Diagnosis not present

## 2015-12-07 DIAGNOSIS — Z96641 Presence of right artificial hip joint: Secondary | ICD-10-CM | POA: Diagnosis not present

## 2015-12-07 DIAGNOSIS — Z809 Family history of malignant neoplasm, unspecified: Secondary | ICD-10-CM | POA: Diagnosis not present

## 2015-12-07 DIAGNOSIS — D7581 Myelofibrosis: Secondary | ICD-10-CM

## 2015-12-07 DIAGNOSIS — E876 Hypokalemia: Secondary | ICD-10-CM | POA: Diagnosis not present

## 2015-12-07 DIAGNOSIS — N183 Chronic kidney disease, stage 3 (moderate): Secondary | ICD-10-CM | POA: Diagnosis not present

## 2015-12-07 DIAGNOSIS — I459 Conduction disorder, unspecified: Secondary | ICD-10-CM | POA: Diagnosis not present

## 2015-12-07 DIAGNOSIS — F411 Generalized anxiety disorder: Secondary | ICD-10-CM

## 2015-12-07 DIAGNOSIS — L8941 Pressure ulcer of contiguous site of back, buttock and hip, stage 1: Secondary | ICD-10-CM | POA: Diagnosis not present

## 2015-12-07 DIAGNOSIS — R0600 Dyspnea, unspecified: Secondary | ICD-10-CM | POA: Diagnosis not present

## 2015-12-07 DIAGNOSIS — D709 Neutropenia, unspecified: Secondary | ICD-10-CM | POA: Diagnosis not present

## 2015-12-07 DIAGNOSIS — L89152 Pressure ulcer of sacral region, stage 2: Secondary | ICD-10-CM | POA: Diagnosis not present

## 2015-12-07 DIAGNOSIS — Z9484 Stem cells transplant status: Secondary | ICD-10-CM | POA: Diagnosis not present

## 2015-12-07 DIAGNOSIS — L03115 Cellulitis of right lower limb: Secondary | ICD-10-CM | POA: Diagnosis not present

## 2015-12-07 DIAGNOSIS — F418 Other specified anxiety disorders: Secondary | ICD-10-CM | POA: Diagnosis not present

## 2015-12-07 DIAGNOSIS — N170 Acute kidney failure with tubular necrosis: Secondary | ICD-10-CM | POA: Diagnosis not present

## 2015-12-07 DIAGNOSIS — D473 Essential (hemorrhagic) thrombocythemia: Secondary | ICD-10-CM | POA: Diagnosis not present

## 2015-12-07 DIAGNOSIS — D61818 Other pancytopenia: Secondary | ICD-10-CM | POA: Diagnosis not present

## 2015-12-07 DIAGNOSIS — J90 Pleural effusion, not elsewhere classified: Secondary | ICD-10-CM | POA: Diagnosis present

## 2015-12-07 DIAGNOSIS — D89813 Graft-versus-host disease, unspecified: Secondary | ICD-10-CM | POA: Diagnosis not present

## 2015-12-07 DIAGNOSIS — G8929 Other chronic pain: Secondary | ICD-10-CM | POA: Diagnosis not present

## 2015-12-07 DIAGNOSIS — Z79899 Other long term (current) drug therapy: Secondary | ICD-10-CM | POA: Diagnosis not present

## 2015-12-07 DIAGNOSIS — E039 Hypothyroidism, unspecified: Secondary | ICD-10-CM | POA: Diagnosis not present

## 2015-12-07 DIAGNOSIS — B965 Pseudomonas (aeruginosa) (mallei) (pseudomallei) as the cause of diseases classified elsewhere: Secondary | ICD-10-CM | POA: Diagnosis not present

## 2015-12-07 DIAGNOSIS — D849 Immunodeficiency, unspecified: Secondary | ICD-10-CM | POA: Diagnosis not present

## 2015-12-07 DIAGNOSIS — I5032 Chronic diastolic (congestive) heart failure: Secondary | ICD-10-CM | POA: Diagnosis not present

## 2015-12-07 DIAGNOSIS — Z85828 Personal history of other malignant neoplasm of skin: Secondary | ICD-10-CM | POA: Diagnosis not present

## 2015-12-07 DIAGNOSIS — K219 Gastro-esophageal reflux disease without esophagitis: Secondary | ICD-10-CM | POA: Diagnosis not present

## 2015-12-07 DIAGNOSIS — I130 Hypertensive heart and chronic kidney disease with heart failure and stage 1 through stage 4 chronic kidney disease, or unspecified chronic kidney disease: Secondary | ICD-10-CM | POA: Diagnosis not present

## 2015-12-07 DIAGNOSIS — Z9889 Other specified postprocedural states: Secondary | ICD-10-CM | POA: Diagnosis not present

## 2015-12-07 LAB — BASIC METABOLIC PANEL
Anion gap: 8 (ref 5–15)
BUN: 39 mg/dL — ABNORMAL HIGH (ref 6–20)
CALCIUM: 8.4 mg/dL — AB (ref 8.9–10.3)
CHLORIDE: 103 mmol/L (ref 101–111)
CO2: 28 mmol/L (ref 22–32)
CREATININE: 1.6 mg/dL — AB (ref 0.61–1.24)
GFR calc Af Amer: 48 mL/min — ABNORMAL LOW (ref >60)
GFR calc non Af Amer: 42 mL/min — ABNORMAL LOW (ref >60)
GLUCOSE: 90 mg/dL (ref 65–99)
Potassium: 3.1 mmol/L — ABNORMAL LOW (ref 3.5–5.1)
SODIUM: 139 mmol/L (ref 135–145)

## 2015-12-07 LAB — BLOOD CULTURE ID PANEL (REFLEXED)
Acinetobacter baumannii: NOT DETECTED
CANDIDA KRUSEI: NOT DETECTED
CARBAPENEM RESISTANCE: NOT DETECTED
Candida albicans: NOT DETECTED
Candida glabrata: NOT DETECTED
Candida parapsilosis: NOT DETECTED
Candida tropicalis: NOT DETECTED
ENTEROCOCCUS SPECIES: NOT DETECTED
Enterobacter cloacae complex: NOT DETECTED
Enterobacteriaceae species: NOT DETECTED
Escherichia coli: NOT DETECTED
Haemophilus influenzae: NOT DETECTED
KLEBSIELLA OXYTOCA: NOT DETECTED
Klebsiella pneumoniae: NOT DETECTED
LISTERIA MONOCYTOGENES: NOT DETECTED
Neisseria meningitidis: NOT DETECTED
Proteus species: NOT DETECTED
Pseudomonas aeruginosa: DETECTED — AB
SERRATIA MARCESCENS: NOT DETECTED
STAPHYLOCOCCUS AUREUS BCID: NOT DETECTED
STAPHYLOCOCCUS SPECIES: NOT DETECTED
STREPTOCOCCUS PNEUMONIAE: NOT DETECTED
Streptococcus agalactiae: NOT DETECTED
Streptococcus pyogenes: NOT DETECTED
Streptococcus species: NOT DETECTED

## 2015-12-07 LAB — C-REACTIVE PROTEIN: CRP: 7.9 mg/dL — ABNORMAL HIGH (ref <1.0)

## 2015-12-07 LAB — BRAIN NATRIURETIC PEPTIDE: B NATRIURETIC PEPTIDE 5: 100.9 pg/mL — AB (ref 0.0–100.0)

## 2015-12-07 LAB — SEDIMENTATION RATE: SED RATE: 10 mm/h (ref 0–16)

## 2015-12-07 MED ORDER — DOXYCYCLINE HYCLATE 100 MG PO TABS
100.0000 mg | ORAL_TABLET | Freq: Two times a day (BID) | ORAL | Status: DC
  Administered 2015-12-07 – 2015-12-08 (×2): 100 mg via ORAL
  Filled 2015-12-07: qty 1

## 2015-12-07 MED ORDER — POTASSIUM CHLORIDE 10 MEQ/100ML IV SOLN
10.0000 meq | INTRAVENOUS | Status: AC
  Administered 2015-12-07 (×3): 10 meq via INTRAVENOUS
  Filled 2015-12-07 (×3): qty 100

## 2015-12-07 MED ORDER — SODIUM CHLORIDE 0.9% FLUSH
10.0000 mL | INTRAVENOUS | Status: DC | PRN
  Administered 2015-12-08: 10 mL
  Filled 2015-12-07: qty 40

## 2015-12-07 MED ORDER — DOXYCYCLINE HYCLATE 100 MG PO TABS
100.0000 mg | ORAL_TABLET | Freq: Two times a day (BID) | ORAL | Status: DC
  Administered 2015-12-07: 100 mg via ORAL
  Filled 2015-12-07: qty 1

## 2015-12-07 MED ORDER — OXYCODONE-ACETAMINOPHEN 5-325 MG PO TABS
1.0000 | ORAL_TABLET | ORAL | Status: DC | PRN
  Administered 2015-12-07: 2 via ORAL
  Administered 2015-12-07 – 2015-12-08 (×5): 1 via ORAL
  Filled 2015-12-07 (×7): qty 1

## 2015-12-07 MED ORDER — MORPHINE SULFATE (PF) 2 MG/ML IV SOLN: 2.0000 mg | INTRAVENOUS | Status: DC | PRN

## 2015-12-07 MED ORDER — COLLAGENASE 250 UNIT/GM EX OINT
TOPICAL_OINTMENT | Freq: Every day | CUTANEOUS | Status: DC
  Administered 2015-12-07 – 2015-12-08 (×2): via TOPICAL
  Filled 2015-12-07: qty 30

## 2015-12-07 MED ORDER — CEFEPIME HCL 1 G IJ SOLR
1.0000 g | INTRAMUSCULAR | Status: AC
  Administered 2015-12-07: 1 g via INTRAVENOUS
  Filled 2015-12-07: qty 1

## 2015-12-07 MED ORDER — DEXTROSE 5 % IV SOLN
1.0000 g | Freq: Two times a day (BID) | INTRAVENOUS | Status: DC
  Filled 2015-12-07: qty 1

## 2015-12-07 MED ORDER — VITAMIN D3 25 MCG (1000 UNIT) PO TABS
1000.0000 [IU] | ORAL_TABLET | Freq: Every day | ORAL | Status: DC
  Administered 2015-12-07 – 2015-12-08 (×2): 1000 [IU] via ORAL
  Filled 2015-12-07 (×2): qty 1

## 2015-12-07 NOTE — Progress Notes (Signed)
Attempted to see pt. Pt eating lunch and asked therapist to return at later time. Will check back as schedule allows. Jinger Neighbors, [place] [phone]

## 2015-12-07 NOTE — Consult Note (Signed)
Consultation Note Date: 12/07/2015   Patient Name: Thomas Cherry  DOB: [birth year]  MRN: [id number]  Age / Sex: 70 y.o., male  PCP: Renato Shin, MD Referring Physician: Rosita Fire, MD  Reason for Consultation: Establishing goals of care  HPI/Patient Profile: 70 y.o. 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  He has been having difficulty with wounds for several years.  These became worse with his hip surgeries. His skin tears very easily. Orthopedic devices used after the surgeries have caused additional wounds.  His wounds are very painful and do not appear to be healing.     Per his wife, [name] had a bone marrow stem cell transplant about 2 years ago.  One year ago they determined his blood counts were worsening and that the transplant was not successful.  He received a DLI infusion to rejuvenate the stem cells.  He has been enrolled on a study of Ixazomib and has taken the drug intermittently when his blood counts permitted.   His platelets have dropped precipitously and the Ixazomib was stopped permanently last Friday.  Clinical Assessment and Goals of Care: Upon introduction [name]) commented that her daughter  "Burman Nieves has been wanting Palliative Care".  I asked why and she replied, "She feels this will not improve and he needs to be made comfortable".  We reviewed [name] health history over the past few years.  We also discussed his current status.  He is living at home with his wife.  He walks well with a walker.  His eating is very poor.  He has had difficulty with ulcerations in his mouth and now he has an altered sense of taste.  Despite his wife's encouragement is PO intake is very  poor.  His sleep is intermittent largely due to anxiety.  Most recently he has had difficulty with shortness of breath.  His dyspnea was eased when he tried a Xanax.  His BMT PA ordered klonopin .5 bid, but this "gorked" him, so his PA recommended she stop it.  We discussed optional symptom management: Glycerin suppository for constipation (in addition to pericolace wife is giving bid) Xanax 0.25 mg PRN QHS for insomnia.  Per wife klonopin 0.5 bid "Gorked" him Lasix 20 mg PRN dyspnea  Wife wants to discuss these medications with the BMT center before ordering them.  She will give me a call this afternoon about whether or not she would like these things ordered.  She requests Palliative Care outpatient.  A case management order has been placed.  Primary Decision Maker:  PATIENT and wife    SUMMARY OF RECOMMENDATIONS    Will follow up in AM to discuss code status and GOC, as well as further sx management  Code Status/Advance Care Planning:  Full code    Symptom Management:   As above - but pending until wife  is comfortable.    Psycho-social/Spiritual:   Desire for further Chaplaincy support:no (Patient is Jewish, wife is Catholic, they Teacher, adult education refused Chaplain support)  Additional Recommendations: Caregiving  Support/Resources  Prognosis:   Difficult to predict but likely less than 6 months given non-healing wounds, minimal PO intake, progressive debility  Discharge Planning: Home with Palliative Services      Primary Diagnoses: Present on Admission: . Cellulitis . Chronic diastolic CHF (congestive heart failure) ([place]) . Essential hypertension . Essential thrombocythemia ([place]) . GERD . Hypothyroidism . Myelofibrosis ([place]) . Depression with anxiety . Pancytopenia ([place]) . Hypokalemia . Neutropenia ([place]) . AKI (acute kidney injury) ([place]) . CKD (chronic kidney disease), stage II . Pressure sore . Cellulitis of right leg   I have reviewed the medical record,  interviewed the patient and family, and examined the patient. The following aspects are pertinent.  Past Medical History:  Diagnosis Date  . Allergy    heyfever  . Arthritis   . Bruises easily   . Elevated platelet count (HCC)    on hydroxyurea  . GERD (gastroesophageal reflux disease)   . Heart murmur   . Hypertension   . Palpitation   . Skin cancer    removed from ear, scalp, and nose  . Thrombocytopenia ([place])   . Urgency of urination    Social History   Social History  . Marital status: Married    Spouse name: N/A  . Number of children: 3  . Years of education: N/A   Occupational History  . court reporter    Social History Main Topics  . Smoking status: Never Smoker  . Smokeless tobacco: Never Used  . Alcohol use No  . Drug use: No  . Sexual activity: Not Asked   Other Topics Concern  . None   Social History Narrative  . None   Family History  Problem Relation Age of Onset  . Cancer Mother   . Cancer Father   . Ovarian cancer    . Uterine cancer    . Colitis      crohns  . Transient ischemic attack Sister     Aortic valve replacement  . Colon cancer Neg Hx    Scheduled Meds: . calcium-vitamin D  1 tablet Oral QID  . carvedilol  6.25 mg Oral BID WC  .  ceFAZolin (ANCEF) IV  1 g Intravenous Q8H  . cholecalciferol  1,000 Units Oral Daily  . doxycycline  100 mg Oral Q12H  . erythromycin  1 application Both Eyes QHS  . escitalopram  10 mg Oral BID  . omega-3 acid ethyl esters  1 g Oral Daily  . pantoprazole  40 mg Oral Daily  . predniSONE  5 mg Oral Q breakfast  . sodium chloride flush  3 mL Intravenous Q12H  . valACYclovir  500 mg Oral Daily   Continuous Infusions:  PRN Meds:.sodium chloride, acetaminophen **OR** acetaminophen, bisacodyl, morphine injection, ondansetron **OR** ondansetron (ZOFRAN) IV, oxyCODONE-acetaminophen, senna-docusate, sodium chloride flush, sodium chloride flush No Known Allergies Review of Systems:   Wife talks for her  husband. She indicates he has altered taste and decreased PO intake, insomnia, anxiety, SOB, constipation and pain.  She denies CP, HA, changes in vision, vomiting, blood in stool.  Physical Exam  Chronically ill appearing, pale man in NAD.   Eyes are rimmed red CV rrr no m/r/g Resp no w/c/r, no distress Abdomen thin soft NT, ND Alert, orientated, Appropriate  Vital Signs: BP 118/71 (BP Location: Right Arm)  Pulse 72   Temp 97.9 F (36.6 C) (Oral)   Resp 16   Wt 68.3 kg (150 lb 9.2 oz)   SpO2 92%   BMI 24.30 kg/m  Pain Assessment: 0-10   Pain Score: 0-No pain   SpO2: SpO2: 92 % O2 Device:SpO2: 92 % O2 Flow Rate: .   IO: Intake/output summary:  Intake/Output Summary (Last 24 hours) at 12/07/15 1311 Last data filed at 12/07/15 [id number]  Gross per 24 hour  Intake           315.83 ml  Output              100 ml  Net           215.83 ml    LBM:   Baseline Weight: Weight: 68.3 kg (150 lb 9.2 oz) Most recent weight: Weight: 68.3 kg (150 lb 9.2 oz)     Palliative Assessment/Data:     Time In: 12:00 Time Out: 1:10 Time Total: 70 min Greater than 50%  of this time was spent counseling and coordinating care related to the above assessment and plan.  Signed by: Imogene Burn, PA-C Palliative Medicine Pager: [phone]  Please contact Palliative Medicine Team phone at [phone] for questions and concerns.  For individual provider: See Shea Evans

## 2015-12-07 NOTE — Care Management Obs Status (Signed)
[facility] NOTIFICATION   Patient Details  Name: Thomas Cherry MRN: [id number] Date of Birth: 05-21-1945   Medicare Observation Status Notification Given:  Yes    Leeroy Cha, RN 12/07/2015, 3:25 PM

## 2015-12-07 NOTE — Progress Notes (Signed)
PHARMACY - PHYSICIAN COMMUNICATION CRITICAL VALUE ALERT - BLOOD CULTURE IDENTIFICATION (BCID)  Results for orders placed or performed during the hospital encounter of 12/06/15  Blood Culture ID Panel (Reflexed) (Collected: 12/06/2015  8:24 PM)  Result Value Ref Range   Enterococcus species NOT DETECTED NOT DETECTED   Listeria monocytogenes NOT DETECTED NOT DETECTED   Staphylococcus species NOT DETECTED NOT DETECTED   Staphylococcus aureus NOT DETECTED NOT DETECTED   Streptococcus species NOT DETECTED NOT DETECTED   Streptococcus agalactiae NOT DETECTED NOT DETECTED   Streptococcus pneumoniae NOT DETECTED NOT DETECTED   Streptococcus pyogenes NOT DETECTED NOT DETECTED   Acinetobacter baumannii NOT DETECTED NOT DETECTED   Enterobacteriaceae species NOT DETECTED NOT DETECTED   Enterobacter cloacae complex NOT DETECTED NOT DETECTED   Escherichia coli NOT DETECTED NOT DETECTED   Klebsiella oxytoca NOT DETECTED NOT DETECTED   Klebsiella pneumoniae NOT DETECTED NOT DETECTED   Proteus species NOT DETECTED NOT DETECTED   Serratia marcescens NOT DETECTED NOT DETECTED   Carbapenem resistance NOT DETECTED NOT DETECTED   Haemophilus influenzae NOT DETECTED NOT DETECTED   Neisseria meningitidis NOT DETECTED NOT DETECTED   Pseudomonas aeruginosa DETECTED (A) NOT DETECTED   Candida albicans NOT DETECTED NOT DETECTED   Candida glabrata NOT DETECTED NOT DETECTED   Candida krusei NOT DETECTED NOT DETECTED   Candida parapsilosis NOT DETECTED NOT DETECTED   Candida tropicalis NOT DETECTED NOT DETECTED    Name of physician (or Provider) Contacted: K Schorr  Changes to prescribed antibiotics required: Stop Ancef, Start Cefepime 1g IV q12h, Continue Doxycycline for MRSA coverage of wounds  Ralene Bathe, PharmD, BCPS 12/07/2015, 8:40 PM  Pager: [phone]

## 2015-12-07 NOTE — Consult Note (Signed)
[facility] Nurse wound consult note Reason for Consult:Wounds to R posterior thigh, Rt posterior lower leg, sacrum and gluteal folds, and Lt trochanter, healing surgical incision to rt trochanter Wound type:Unstagable pressure injury to lt trochanter, Rt posterior thigh and rt posterior lower leg moisture/ pressure injury, sacrum Moisture associated Skin Damage(MASD) Pressure Ulcer POA: Yes Measurement:RT posterior lower leg 3x5.5x0.1, Rt posterior thigh 6x3x0.1, Rt trochanter 8.5x6x0.1 Wound bed::RT posterior lower leg Brown necrotic tissue Rt posterior thigh Brown necrotic tissue, Rt trochanter Brown necrotic tissue, Sacral area multiple small open areas r/t( MASD) Drainage (amount, consistency, odor) Small brown drainage from leg wounds and Rt trochanter, Small serous drainage from sacral area.  Periwound:Intact, Barrier cream to sacral area and gluteal fold, leave open to air in contact with derma therapy linen, no disposables.  Dressing procedure/placement/frequency:Apply Santyl to wound to Rt posterior lower leg, Rt posterior thigh, and Rt trochanter, cover NS moist gauze and 4x4 dry gauze, secure with Medipore tape.   Lenard Simmer WOC student Will not follow at this time.  Please re-consult if needed.  Domenic Moras RN BSN [facility] Pager [phone]

## 2015-12-07 NOTE — Progress Notes (Signed)
Pt in from [facility] on stretcher pushed by nurse tech. Stage II pressure ulcer to sacrum, stage I to both heels. allevyn dressings applied to both sites. Healed abrasions to both knees. Petechiae noted to BLEs. Pt has a surgical incision to right hip that continues to heal. At the top of incision noted with yellow discoloration. allevyn dressing applied. Stage 1 and some abrasions noted to toes on both feet. VWilliams,rn.

## 2015-12-07 NOTE — Progress Notes (Addendum)
PROGRESS NOTE    Thomas Cherry  [id number] DOB: [birth year] DOA: 12/06/2015 PCP: Renato Shin, MD    Brief Narrative: 70 y.o. male with medical history significant for hypertension, chronic diastolic CHF, and myelofibrosis status post stem cell transplant and subsequent graft-versus-host disease, recent right hip surgery for AVN subsequent rehab discharge then home, now presenting to the emergency department for evaluation of increased pain and erythema surrounding his pressure wounds.  Assessment & Plan:  # Multiple pressure ulcers with surrounding cellulitis:  -Patient has right posterior thigh, right posterior lower leg, sacral and gluteal ulcers. The ulcers with surrounding erythema concerning for cellulitis. Patient is currently on IV Ancef and I will add oral doxycycline to add coverage for MRSA. -Wound care nurse consult appreciated, recommended: Apply Santyl to wound to Rt posterior lower leg, Rt posterior thigh, and Rt trochanter, cover NS moist gauze and 4x4 dry gauze, secure with Medipore tape.   -Follow up culture results. Likely able to switch to oral antibiotics tomorrow. Patient needs outpatient wound care on discharge. I discussed above with the patient and his daughter at bedside.  #Chronic kidney disease stage III: On reviewing labs from last few years, patient's serum creatinine level is around baseline. Continue to monitor BMP. Avoid nephrotoxins.   #Hypokalemia: Repleted potassium chloride. Encourage oral intake.  #  History of myelofibrosis, graft versus host disease and pancytopenia: -Followed by Dr. Julien Nordmann of oncology and receiving care through [facility], currently managed with daily prednisone 5 mg qD  -Patient has mild neutropenia. Follow-up labs.  # Essential hypertension: Blood pressure acceptable. Continue Coreg.    #Goals of care: I discussed with the patient and his daughter at bedside about his chronic medical condition including myelofibrosis,  graft-versus-host disease, pancytopenia, right hip surgery about a month ago leading to disability and multiple sores on the back and posterior side of right lower extremities, physical deconditioning and chronic pain. Patient agreed and willing to discuss with the palliative care services regarding discussion about symptoms management and goals of care discussion. -PT, OT therapy ordered  DVT prophylaxis: SCD and early ambulation Code Status: Full code Family Communication: Daughter at bedside Disposition Plan: Likely discharge to home versus rehabilitation in 1-2 days   Consultants:   Wound care nurse  Procedures: None Antimicrobials: IV Ancef and doxycycline  Subjective: Patient was seen and examined at bedside. Patient reported generalized weakness, fatigue. Reported chronic pain on the back around the site of ulcers. Denied chills, headache, dizziness, nausea, vomiting, chest pain or shortness of breath.   Objective: Vitals:   12/06/15 2116 12/06/15 2339 12/07/15 0029 12/07/15 0552  BP: 132/88 127/79 (!) 151/80 118/71  Pulse: 70 78 77 72  Resp: 15 20 18 16   Temp:   98.4 F (36.9 C) 97.9 F (36.6 C)  TempSrc:   Oral Oral  SpO2: 96% 96% 95% 92%  Weight:    68.3 kg (150 lb 9.2 oz)    Intake/Output Summary (Last 24 hours) at 12/07/15 1444 Last data filed at 12/07/15 [id number]  Gross per 24 hour  Intake           315.83 ml  Output              100 ml  Net           215.83 ml   Filed Weights   12/07/15 0552  Weight: 68.3 kg (150 lb 9.2 oz)    Examination:  General exam: Ill-looking frail male lying on bed comfortably Respiratory system: Clear  to auscultation. Respiratory effort normal. Cardiovascular system: S1 & S2 heard, RRR. No pedal edema. Gastrointestinal system: Abdomen is nondistended, soft and nontender.  Normal bowel sounds heard. Central nervous system: Alert and oriented. No focal neurological deficits. Extremities: Symmetric 5 x 5 power. Skin: Multiple  pressure ulcers on the sacrum, buttock, posterior right thigh and lower extremity. Dressing on. Psychiatry: Judgement and insight appear normal. Mood & affect appropriate.     Data Reviewed: I have personally reviewed following labs and imaging studies  CBC:  Recent Labs Lab 12/06/15 2024  WBC 2.3*  NEUTROABS 1.1*  HGB 8.8*  HCT 27.1*  MCV 95.8  PLT 57*   Basic Metabolic Panel:  Recent Labs Lab 12/06/15 2015 12/06/15 2024 12/07/15 0600  NA  --  137 139  K  --  3.2* 3.1*  CL  --  102 103  CO2  --  27 28  GLUCOSE  --  101* 90  BUN  --  39* 39*  CREATININE  --  1.63* 1.60*  CALCIUM  --  8.5* 8.4*  MG 2.3  --   --    GFR: Estimated Creatinine Clearance: 38.2 mL/min (by C-G formula based on SCr of 1.6 mg/dL (H)). Liver Function Tests:  Recent Labs Lab 12/06/15 2024  AST 15  ALT 11*  ALKPHOS 79  BILITOT 0.5  PROT 5.0*  ALBUMIN 2.8*   No results for input(s): LIPASE, AMYLASE in the last 168 hours. No results for input(s): AMMONIA in the last 168 hours. Coagulation Profile: No results for input(s): INR, PROTIME in the last 168 hours. Cardiac Enzymes: No results for input(s): CKTOTAL, CKMB, CKMBINDEX, TROPONINI in the last 168 hours. BNP (last 3 results) No results for input(s): PROBNP in the last 8760 hours. HbA1C: No results for input(s): HGBA1C in the last 72 hours. CBG: No results for input(s): GLUCAP in the last 168 hours. Lipid Profile: No results for input(s): CHOL, HDL, LDLCALC, TRIG, CHOLHDL, LDLDIRECT in the last 72 hours. Thyroid Function Tests: No results for input(s): TSH, T4TOTAL, FREET4, T3FREE, THYROIDAB in the last 72 hours. Anemia Panel: No results for input(s): VITAMINB12, FOLATE, FERRITIN, TIBC, IRON, RETICCTPCT in the last 72 hours. Sepsis Labs:  Recent Labs Lab 12/06/15 2033  LATICACIDVEN 0.97    Recent Results (from the past 240 hour(s))  Culture, blood (Routine X 2) w Reflex to ID Panel     Status: None (Preliminary result)     Collection Time: 12/06/15  8:24 PM  Result Value Ref Range Status   Specimen Description PORTA CATH  Final   Special Requests BOTTLES DRAWN AEROBIC AND ANAEROBIC 5CC  Final   Culture   Final    NO GROWTH < 24 HOURS Performed at [facility]    Report Status PENDING  Incomplete  Culture, blood (Routine X 2) w Reflex to ID Panel     Status: None (Preliminary result)   Collection Time: 12/06/15  8:30 PM  Result Value Ref Range Status   Specimen Description LEFT ANTECUBITAL  Final   Special Requests BOTTLES DRAWN AEROBIC AND ANAEROBIC 5CC  Final   Culture   Final    NO GROWTH < 24 HOURS Performed at [facility]    Report Status PENDING  Incomplete         Radiology Studies: Dg Chest Portable 1 View  Result Date: 12/06/2015 CLINICAL DATA:  70 y/o  M; shortness of breath and weakness today. EXAM: PORTABLE CHEST 1 VIEW COMPARISON:  10/13/2015 chest radiograph  FINDINGS: Enlarged cardiac silhouette a globular appearance, possibly accentuated technique. Right port catheter tip projects over lower SVC. Small bilateral pleural effusions, right greater than left and associated bibasilar opacities. Pulmonary vascular congestion. No acute osseous abnormality is evident. IMPRESSION: Right greater than left small effusions and bibasilar opacities which probably represent associated atelectasis. Pulmonary vascular congestion. Enlarged cardiac silhouette with a globular appearance, question pericardial effusion. Electronically Signed   By: Kristine Garbe M.D.   On: 12/06/2015 20:54        Scheduled Meds: . calcium-vitamin D  1 tablet Oral QID  . carvedilol  6.25 mg Oral BID WC  .  ceFAZolin (ANCEF) IV  1 g Intravenous Q8H  . cholecalciferol  1,000 Units Oral Daily  . doxycycline  100 mg Oral Q12H  . erythromycin  1 application Both Eyes QHS  . escitalopram  10 mg Oral BID  . omega-3 acid ethyl esters  1 g Oral Daily  . pantoprazole  40 mg Oral Daily  . potassium  chloride  10 mEq Intravenous Q1 Hr x 3  . predniSONE  5 mg Oral Q breakfast  . sodium chloride flush  3 mL Intravenous Q12H  . valACYclovir  500 mg Oral Daily   Continuous Infusions:    LOS: 0 days    Time spent: 30 minutes    Jaden Abreu Tanna Furry, MD [facility] Pager [phone]  If 7PM-7AM, please contact night-coverage [email or web address] Password [email or web address] 12/07/2015, 2:44 PM

## 2015-12-07 NOTE — Evaluation (Signed)
Physical Therapy Evaluation Patient Details Name: Thomas Cherry MRN: [id number] DOB: [birth year] Today's Date: 12/07/2015   History of Present Illness  70 y.o. 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 Pt currently with functional limitations due to the deficits listed below (see PT Problem List). * Pt will benefit from skilled PT to increase their independence and safety with mobility to allow discharge to the venue listed below.       Follow Up Recommendations Home health PT;Supervision for mobility/OOB (resume)    Equipment Recommendations  None recommended by PT    Recommendations for Other Services       Precautions / Restrictions Precautions Precautions: Fall Precaution Comments: still follows THP d/t long standing issues with R hip/surgeries Restrictions Weight Bearing Restrictions: No      Mobility  Bed Mobility Overal bed mobility: Needs Assistance Bed Mobility: Supine to Sit     Supine to sit: Min assist     General bed mobility comments: assist with LEs, incr time and cues to avoid shearing sacral area  Transfers Overall transfer level: Needs assistance Equipment used: Rolling walker (2 wheeled) Transfers: Sit to/from Stand Sit to Stand: Min assist;Min guard         General transfer comment: from bed and chair, cues for hand placement and RLE position  Ambulation/Gait Ambulation/Gait assistance: Min guard Ambulation Distance (Feet): 30 Feet (40' more) Assistive device: Rolling walker (2 wheeled) Gait Pattern/deviations: Step-through pattern;Decreased stride length;Trunk flexed     General Gait  Details: cues for posture, RW position, step length; incr time; fatigues easily--requires sitting rest after initial 30'  Stairs            Wheelchair Mobility    Modified Rankin (Stroke Patients Only)       Balance Overall balance assessment: Needs assistance;History of Falls Sitting-balance support: Bilateral upper extremity supported;Single extremity supported;Feet supported Sitting balance-Leahy Scale: Fair     Standing balance support: Bilateral upper extremity supported Standing balance-Leahy Scale: Fair Standing balance comment: reliant on UEs, fearful of falling                             Pertinent Vitals/Pain Pain Assessment: No/denies pain    Home Living Family/patient expects to be discharged to:: Private residence Living Arrangements: Spouse/significant other Available Help at Discharge: Family;Available 24 hours/day Type of Home: House Home Access: Ramped entrance     Home Layout: One level Home Equipment: Walker - 2 wheels;Bedside commode;Tub bench;Wheelchair - Tree surgeon - 4 wheels      Prior Function Level of Independence: Needs assistance   Gait / Transfers Assistance Needed: amb with rollator   ADL's / Homemaking Assistance Needed: sponge bathes; assist with dressing        Hand Dominance        Extremity/Trunk Assessment               Lower Extremity Assessment: Generalized weakness;RLE deficits/detail;LLE deficits/detail RLE Deficits / Details: edematous LEs, AROM hips/knees limited to ~30% of range; able to flex right hip and knee to ~60/60 in sitting LLE Deficits / Details: as above except able to flex knee and hip to ~90/90  Communication   Communication: No difficulties  Cognition Arousal/Alertness: Awake/alert Behavior During Therapy: WFL for tasks assessed/performed Overall Cognitive Status: Within Functional Limits for tasks assessed                      General  Comments      Exercises General Exercises - Lower Extremity Ankle Circles/Pumps: AROM;Both;5 reps Quad Sets: 5 reps;Both;AROM   Assessment/Plan    PT Assessment    PT Problem List            PT Treatment Interventions      PT Goals (Current goals can be found in the Care Plan section)  Acute Rehab PT Goals Patient Stated Goal: home soon PT Goal Formulation: With patient/family Time For Goal Achievement: 12/14/15 Potential to Achieve Goals: Fair    Frequency     Barriers to discharge        Co-evaluation               End of Session Equipment Utilized During Treatment: Gait belt Activity Tolerance: Patient limited by fatigue Patient left: in chair;with call bell/phone within reach;with chair alarm set;with family/visitor present      Functional Assessment Tool Used: clinical judgement Functional Limitation: Mobility: Walking and moving around Mobility: Walking and Moving Around Current Status [phone]): At least 1 percent but less than 20 percent impaired, limited or restricted Mobility: Walking and Moving Around Goal Status [phone]): At least 1 percent but less than 20 percent impaired, limited or restricted    Time: 1437-1508 PT Time Calculation (min) (ACUTE ONLY): 31 min   Charges:   PT Evaluation $PT Eval Low Complexity: 1 Procedure PT Treatments $Gait Training: 8-22 mins   PT G Codes:   PT G-Codes **NOT FOR INPATIENT CLASS** Functional Assessment Tool Used: clinical judgement Functional Limitation: Mobility: Walking and moving around Mobility: Walking and Moving Around Current Status [id number]): At least 1 percent but less than 20 percent impaired, limited or restricted Mobility: Walking and Moving Around Goal Status [phone]): At least 1 percent but less than 20 percent impaired, limited or restricted    [facility] 12/07/2015, 3:18 PM

## 2015-12-08 DIAGNOSIS — D849 Immunodeficiency, unspecified: Secondary | ICD-10-CM

## 2015-12-08 DIAGNOSIS — F418 Other specified anxiety disorders: Secondary | ICD-10-CM

## 2015-12-08 DIAGNOSIS — D61818 Other pancytopenia: Secondary | ICD-10-CM

## 2015-12-08 DIAGNOSIS — Z515 Encounter for palliative care: Secondary | ICD-10-CM

## 2015-12-08 DIAGNOSIS — N182 Chronic kidney disease, stage 2 (mild): Secondary | ICD-10-CM

## 2015-12-08 DIAGNOSIS — R7881 Bacteremia: Secondary | ICD-10-CM

## 2015-12-08 DIAGNOSIS — Z7189 Other specified counseling: Secondary | ICD-10-CM

## 2015-12-08 DIAGNOSIS — D899 Disorder involving the immune mechanism, unspecified: Secondary | ICD-10-CM

## 2015-12-08 LAB — BASIC METABOLIC PANEL
Anion gap: 7 (ref 5–15)
BUN: 34 mg/dL — AB (ref 6–20)
CALCIUM: 8.6 mg/dL — AB (ref 8.9–10.3)
CO2: 29 mmol/L (ref 22–32)
CREATININE: 1.52 mg/dL — AB (ref 0.61–1.24)
Chloride: 102 mmol/L (ref 101–111)
GFR calc Af Amer: 51 mL/min — ABNORMAL LOW (ref >60)
GFR, EST NON AFRICAN AMERICAN: 44 mL/min — AB (ref >60)
Glucose, Bld: 93 mg/dL (ref 65–99)
POTASSIUM: 4.3 mmol/L (ref 3.5–5.1)
SODIUM: 138 mmol/L (ref 135–145)

## 2015-12-08 LAB — CBC WITH DIFFERENTIAL/PLATELET
BASOS ABS: 0 10*3/uL (ref 0.0–0.1)
Basophils Relative: 1 %
EOS ABS: 0.1 10*3/uL (ref 0.0–0.7)
Eosinophils Relative: 5 %
HCT: 26.6 % — ABNORMAL LOW (ref 39.0–52.0)
Hemoglobin: 8.7 g/dL — ABNORMAL LOW (ref 13.0–17.0)
LYMPHS PCT: 25 %
Lymphs Abs: 0.4 10*3/uL — ABNORMAL LOW (ref 0.7–4.0)
MCH: 31.5 pg (ref 26.0–34.0)
MCHC: 32.7 g/dL (ref 30.0–36.0)
MCV: 96.4 fL (ref 78.0–100.0)
MONO ABS: 0.4 10*3/uL (ref 0.1–1.0)
Monocytes Relative: 24 %
NEUTROS ABS: 0.7 10*3/uL — AB (ref 1.7–7.7)
NEUTROS PCT: 45 %
PLATELETS: 112 10*3/uL — AB (ref 150–400)
RBC: 2.76 MIL/uL — ABNORMAL LOW (ref 4.22–5.81)
RDW: 16.3 % — AB (ref 11.5–15.5)
WBC: 1.6 10*3/uL — ABNORMAL LOW (ref 4.0–10.5)

## 2015-12-08 MED ORDER — DOXYCYCLINE HYCLATE 100 MG PO TABS: 100.0000 mg | ORAL_TABLET | Freq: Two times a day (BID) | ORAL | Status: AC

## 2015-12-08 MED ORDER — LIP MEDEX EX OINT
TOPICAL_OINTMENT | CUTANEOUS | Status: DC | PRN
  Filled 2015-12-08: qty 7

## 2015-12-08 MED ORDER — BISACODYL 5 MG PO TBEC: 5.0000 mg | DELAYED_RELEASE_TABLET | Freq: Every day | ORAL | Status: DC | PRN

## 2015-12-08 MED ORDER — HEPARIN SOD (PORK) LOCK FLUSH 100 UNIT/ML IV SOLN
500.0000 [IU] | INTRAVENOUS | Status: AC | PRN
  Administered 2015-12-08: 500 [IU]

## 2015-12-08 MED ORDER — DEXTROSE 5 % IV SOLN: 2.0000 g | Freq: Two times a day (BID) | INTRAVENOUS | Status: AC

## 2015-12-08 MED ORDER — DEXTROSE 5 % IV SOLN
2.0000 g | Freq: Two times a day (BID) | INTRAVENOUS | Status: DC
  Administered 2015-12-08: 2 g via INTRAVENOUS
  Filled 2015-12-08 (×2): qty 2

## 2015-12-08 MED ORDER — GLYCERIN (LAXATIVE) 2.1 G RE SUPP
1.0000 | Freq: Once | RECTAL | Status: DC
  Filled 2015-12-08: qty 1

## 2015-12-08 MED ORDER — HEPARIN SOD (PORK) LOCK FLUSH 100 UNIT/ML IV SOLN
500.0000 [IU] | INTRAVENOUS | Status: AC
  Administered 2015-12-08: 500 [IU] via INTRAVENOUS
  Filled 2015-12-08: qty 5

## 2015-12-08 MED ORDER — BIOTENE DRY MOUTH MT LIQD
15.0000 mL | Freq: Three times a day (TID) | OROMUCOSAL | Status: DC
  Administered 2015-12-08 (×2): 15 mL via OROMUCOSAL
  Filled 2015-12-08: qty 15

## 2015-12-08 MED ORDER — FUROSEMIDE 20 MG PO TABS
20.0000 mg | ORAL_TABLET | Freq: Once | ORAL | Status: AC
  Administered 2015-12-08: 20 mg via ORAL
  Filled 2015-12-08: qty 1

## 2015-12-08 MED ORDER — ALPRAZOLAM 0.25 MG PO TABS
0.2500 mg | ORAL_TABLET | Freq: Two times a day (BID) | ORAL | Status: DC | PRN
  Administered 2015-12-08: 0.25 mg via ORAL
  Filled 2015-12-08: qty 1

## 2015-12-08 MED ORDER — COLLAGENASE 250 UNIT/GM EX OINT: TOPICAL_OINTMENT | Freq: Every day | CUTANEOUS | 0 refills | Status: AC

## 2015-12-08 NOTE — Progress Notes (Signed)
12/08/15 [facility] called requesting a facesheet to be faxed. Facesheet was faxed to [phone].

## 2015-12-08 NOTE — Progress Notes (Addendum)
Daily Progress Note   Patient Name: Thomas Cherry       Date: 12/08/2015 DOB: March 19, 1945  Age: 70 y.o. MRN#: [id number] Attending Physician: Rosita Fire, MD Primary Care Physician: Renato Shin, MD Admit Date: 12/06/2015  Reason for Consultation/Follow-up: Establishing goals of care  Subjective: Met at bedside with Thomas Cherry.  He doesn't offer information but will answer appropriately when asked direct questions.  He tells me he is in pain, he did not sleep well, his lips are dry, he's thirsty and his breathing is labored.  We talked about his 18 yr marriage and how he met his wife.  They worked at [employer] together.   His daugther, is Demetrios Isaacs, MD in pediatrics at [employer].  He commented that he just wanted to be like he used to be.   I asked if the doctor that treated his myelofibrosis told him what was likely to happen in the coming months and years.  He said no - that he just kept trying different things.  We discussed code status - Thomas Cherry was very confident that he wants to remain a full code.    I gave him a hard choices booklet and asked him to read it.   I told him that if he was still in the hospital I would see him and we would talk again tomorrow.  Length of Stay: 1  Current Medications: Scheduled Meds:  . calcium-vitamin D  1 tablet Oral QID  . carvedilol  6.25 mg Oral BID WC  . ceFEPime (MAXIPIME) IV  2 g Intravenous Q12H  . cholecalciferol  1,000 Units Oral Daily  . collagenase   Topical Daily  . doxycycline  100 mg Oral Q12H  . erythromycin  1 application Both Eyes QHS  . escitalopram  10 mg Oral BID  . furosemide  20 mg Oral Once  . Glycerin (Adult)  1 suppository Rectal Once  . omega-3 acid ethyl esters  1 g Oral Daily  .  pantoprazole  40 mg Oral Daily  . predniSONE  5 mg Oral Q breakfast  . sodium chloride flush  3 mL Intravenous Q12H  . valACYclovir  500 mg Oral Daily    Continuous Infusions:    PRN Meds: sodium chloride, acetaminophen **OR** acetaminophen, ALPRAZolam, bisacodyl, morphine injection, ondansetron **OR**  ondansetron (ZOFRAN) IV, oxyCODONE-acetaminophen, senna-docusate, sodium chloride flush, sodium chloride flush  Physical Exam      Well developed, pale skin, A&O, appropriate.   Resp:  Mild use of accessory muscles.  Becomes SOB when speaking. CV:  rrr with systolic murmur, no rubs or gallops Abdomen soft, nt, nd Ext:  Able to move all 4    Skin:  Hands are covered with white gloves, pink papular rash noted on left forearm.  Vital Signs: BP 118/73 (BP Location: Left Arm)   Pulse 76   Temp 99.1 F (37.3 C) (Oral)   Resp 17   Ht _0  (1.753 m)   Wt 70.1 kg (154 lb 8.7 oz)   SpO2 92%   BMI 22.82 kg/m  SpO2: SpO2: 92 % O2 Device: O2 Device: Not Delivered O2 Flow Rate:    Intake/output summary:  Intake/Output Summary (Last 24 hours) at 12/08/15 3299 Last data filed at 12/08/15 2426  Gross per 24 hour  Intake              360 ml  Output              300 ml  Net               60 ml   LBM: Last BM Date: 12/04/15 (per pt) Baseline Weight: Weight: 68.3 kg (150 lb 9.2 oz) Most recent weight: Weight: 70.1 kg (154 lb 8.7 oz)       Palliative Assessment/Data:    Flowsheet Rows   Flowsheet Row Most Recent Value  Intake Tab  Referral Department  Hospitalist  Unit at Time of Referral  Med/Surg Unit  Palliative Care Primary Diagnosis  Other (Comment)  Date Notified  12/07/15  Palliative Care Type  New Palliative care  Reason for referral  Clarify Goals of Care, Pain, Non-pain Symptom  Date of Admission  12/06/15  Date first seen by Palliative Care  12/07/15  # of days Palliative referral response time  0 Day(s)  # of days IP prior to Palliative referral  1  Clinical  Assessment  Palliative Performance Scale Score  50%  Pain Max last 24 hours  6  Pain Min Last 24 hours  3  Anxiety Max Last 24 Hours  7  Anxiety Min Last 24 Hours  4  Psychosocial & Spiritual Assessment  Palliative Care Outcomes  Patient/Family meeting held?  Yes  Who was at the meeting?  patient and wife      Patient Active Problem List   Diagnosis Date Noted  . Slow transit constipation   . Anxiety state   . Dyspnea   . Cellulitis 12/06/2015  . Depression with anxiety 12/06/2015  . Pancytopenia ([place]) 12/06/2015  . Hypokalemia 12/06/2015  . Neutropenia ([place]) 12/06/2015  . AKI (acute kidney injury) ([place]) 12/06/2015  . CKD (chronic kidney disease), stage II 12/06/2015  . Pressure sore 12/06/2015  . Cellulitis of right leg 12/06/2015  . Chronic diastolic CHF (congestive heart failure) ([place]) 10/21/2015  . Leg ulcer, left ([place]) 10/20/2015  . Pneumonia 10/20/2015  . Cough 10/13/2015  . Hypothyroidism 07/13/2015  . Hypocalcemia 07/13/2015  . Left inguinal hernia 04/13/2015  . UTI (lower urinary tract infection)   . Orthostasis   . Debility 03/18/2015  . 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 Skin tear of elbow without complication 83/41/9622  . Hip dislocation, left ([place]) 01/23/2015  . Myelofibrosis ([place]) 06/17/2013  . Nonspecific abnormal electrocardiogram (ECG) (EKG) 03/26/2013  .  Seborrhea 03/24/2012  . Palpitation   . Essential hypertension   . 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 Essential thrombocythemia ([place]) 12/28/2010  . Rash 11/24/2010  . Toe pain, left 11/16/2010  . Thrombocytosis ([place]) 11/16/2010  . Personal history of colonic polyps 06/14/2010  . Screening for prostate cancer 06/02/2010  . Pure hypercholesterolemia 06/02/2010  . Inguinal hernia 06/02/2010  . Encounter for long-term (current) use of other medications 06/02/2010  . CERUMEN IMPACTION, RIGHT 03/26/2008  . GERD 10/11/2006    Palliative Care  Assessment & Plan   Patient Profile: 70 y.o. 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  He has been having difficulty with wounds for several years.  These became worse with his hip surgeries. Approx 1 month ago he had a total hip replacement for AVN.  His skin tears very easily. Orthopedic devices used after the surgeries have caused additional wounds.  His wounds are very painful and do not appear to be healing well.     Per his wife, Thomas Cherry had a bone marrow stem cell transplant about 2 years ago.  One year ago they determined his blood counts were worsening and that the transplant was not successful.  He received a DLI infusion to rejuvenate the stem cells.  He has been enrolled on a study of Ixazomib and has taken the drug intermittently when his blood counts permitted.   His platelets have dropped precipitously and the Ixazomib was stopped permanently last Friday.  Assessment: 70 yo male with a slow chronic debilitation. Most recently he has been bothered by dyspnea.  I am uncertain whether his dyspnea is from anxiety or diastolic heart failure and will defer this to the primary team.   Recommendations/Plan:  Xanax .25 mg BID PRN for anxiety and sleep  Agree with percocet PRN for pain  Glycerin suppository for constipation x 1 today  Will give 1 dose of lasix 20 mg PO (creatinine 1.5 at baseline) today and defer to primary team.  I will call his primary oncologist to get a better idea of his prognosis/disease trajectory.  ADDENDUM: I spoke with [facility] heme/onc, Ethlyn Daniels, PA who knows Thomas Cherry well.  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  He has to be on continuous treatment for GVH or he will be completely debilitated but the constant  immunosuppression is/will take its toll.  He has developed AVN and now has had 2 hip repairs.  His nutrition is becoming poor, and he is weak.  Her major concern is his new onset of dyspnea over the last 2 months.  There is concerned for GVH of the lung, vs D-HF, vs anxiety.  Per Brianne GVH of the lung would not show on imaging but would require a biopsy.  The long term goal in Thomas Cherry case is not recovery but rather more functionality and a decent quality of life.  Goals of Care and Additional Recommendations:  Limitations on Scope of Treatment: Full Scope Treatment  Code Status:  Full code  Prognosis:   Unable to determine  Discharge Planning:  Home with [place] was discussed with bedside RN and patient.  Thank you for allowing the Palliative Medicine Team to assist in the care of this patient.   Time In: 8:00 Time  Out: 8:35 Total Time 35 min Prolonged Time Billed no      Greater than 50%  of this time was spent counseling and coordinating care related to the above assessment and plan.  Imogene Burn, PA-C Palliative Medicine   Please contact Palliative MedicineTeam phone at [phone] for questions and concerns.   Please see AMION for individual provider pager numbers.

## 2015-12-08 NOTE — Progress Notes (Signed)
Occupational Therapy Evaluation Patient Details Name: Thomas Cherry MRN: [id number] DOB: July 13, 1945 Today's Date: 12/08/2015    History of Present Illness 70 y.o. 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 He will benefit from skilled OT to maximize function and to facilitate a safe discharge. OT will follow.    Follow Up Recommendations  Supervision/Assistance - 24 hour;Home health OT    Equipment Recommendations  None recommended by OT    Recommendations for Other Services       Precautions / Restrictions Precautions Precautions: Fall Precaution Comments: still follows THP d/t long standing issues with R hip/surgeries Restrictions Weight Bearing Restrictions: No      Mobility Bed Mobility Overal bed mobility: Needs Assistance Bed Mobility: Supine to Sit     Supine to sit: Mod assist;HOB elevated     General bed mobility comments: assist with LEs and trunk, increased time  Transfers Overall transfer level: Needs assistance Equipment used: Rolling walker (2 wheeled) Transfers: Sit to/from Stand Sit to Stand: Min assist;From elevated surface         General transfer comment: increased time/effort, assist to rise and stabilize, initial posterior lean in standing    Balance                                            ADL Overall ADL's : Needs assistance/impaired Eating/Feeding: Set up Eating/Feeding Details (indicate cue type and reason): while RN administering medications, noted pt coughing a lot.                      Toilet Transfer: Min guard;Minimal assistance;Ambulation;Regular Toilet;RW;Grab bars   Toileting- Clothing Manipulation and Hygiene: Minimal assistance Toileting - Clothing Manipulation Details (indicate cue type and reason): to hold gown up while he urinated     Functional mobility during ADLs: Minimal assistance;Min guard;Rolling walker General ADL Comments: Patient agreeable to session. After taking medication (administered by RN during OT session), pt performed bed mobility, sit to stand, ambulation to/from bathroom, standing to urinate/toileting, and ambulate back to recliner and positioned comfortably.     Vision     Perception     Praxis      Pertinent Vitals/Pain Pain Assessment: 0-10 Pain Score: 0-No pain     Hand Dominance Right   Extremity/Trunk Assessment Upper Extremity Assessment Upper Extremity Assessment: Generalized weakness (wears gloves both hands due to sensitivity)   Lower Extremity Assessment Lower Extremity Assessment: Defer to PT evaluation       Communication Communication Communication: No difficulties   Cognition Arousal/Alertness: Awake/alert Behavior During Therapy: WFL for tasks assessed/performed Overall Cognitive Status: Within Functional Limits for tasks assessed                     General Comments       Exercises       Shoulder Instructions      Home Living Family/patient expects to be discharged to:: Private residence Living Arrangements: Spouse/significant other Available Help at Discharge: Family;Available 24 hours/day Type of  Home: House Home Access: [place]: One level         Bathroom Toilet: Standard Bathroom Accessibility: Yes How Accessible: Accessible via walker Home Equipment: [place] - 2 wheels;Bedside commode;Tub bench;Wheelchair - Tree surgeon - 4 wheels Adaptive Equipment: Reacher;Sock aid;Long-handled sponge Additional Comments: 3 falls in last  year      Prior Functioning/Environment Level of Independence: Needs assistance  Gait / Transfers Assistance Needed: amb with rollator  ADL's / Homemaking Assistance Needed: Assistance with sponge bathing, dressing, toileting            OT Problem List: Decreased strength;Decreased activity tolerance;Impaired balance (sitting and/or standing);Decreased safety awareness;Decreased knowledge of use of DME or AE;Pain   OT Treatment/Interventions: Self-care/ADL training;Therapeutic exercise;DME and/or AE instruction;Therapeutic activities;Patient/family education    OT Goals(Current goals can be found in the care plan section) Acute Rehab OT Goals Patient Stated Goal: home soon OT Goal Formulation: With patient Time For Goal Achievement: 12/22/15 Potential to Achieve Goals: Fair ADL Goals Pt Will Perform Grooming: with supervision;standing Pt Will Transfer to Toilet: with supervision;ambulating;regular height toilet Pt Will Perform Toileting - Clothing Manipulation and hygiene: with supervision;sit to/from stand  OT Frequency: Min 2X/week   Barriers to D/C:            Co-evaluation              End of Session Equipment Utilized During Treatment: Surveyor, mining Communication: Other (comment) (suggested SLP eval due to patient coughing while taking meds)  Activity Tolerance: Patient tolerated treatment well;Patient limited by fatigue Patient left: in chair;with call bell/phone within reach;with chair alarm set   Time: 1007-1033 OT Time Calculation (min): 26 min Charges:  OT General Charges $OT Visit: 1 Procedure OT Evaluation $OT Eval Moderate Complexity: 1 Procedure OT Treatments $Self Care/Home Management : 8-22 mins G-Codes:    Maverick Patman A 01/03/16, 10:57 AM

## 2015-12-08 NOTE — Progress Notes (Signed)
[facility] arrived to unit at [facility]. Pt slid onto stretcher. Percocet last given at 1820 to help control pain while in transport. Glasses and cell phone on patient. All other belongings sent with wife. Patient transferred to [facility] tp their bone marrow transplant unit.

## 2015-12-08 NOTE — Discharge Summary (Signed)
Physician Discharge Summary  Thomas Cherry [id number] DOB: 1945-04-23 DOA: 12/06/2015  PCP: Renato Shin, MD  Admit date: 12/06/2015 Discharge date: 12/08/2015  Admitted From:  Disposition:   Recommendations for Outpatient Follow-up:  1. Follow up with PCP in 1-2 weeks 2. Please follow up the final culture result and follow up at [facility].   Home Health:No. Patient is being transferred to [facility] 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  Today, I received a call from [facility] bone marrow transplant unit at [facility], spoke with Tanya Nones (pager [phone]) who requested to transfer patient to [facility] for further evaluation. I updated current clinical condition including cellulitis, Pseudomonas bacteremia in 1 bottle, pancytopenia. Discussed the current clinical condition and treatment. I also discussed the transfer center at  [facility] and provided the necessary information.  I discussed with the patient and his daughter Demetrios Isaacs regarding operated clinical condition and being transfer to [facility] Both the patient and daughter agreed with the transfer. Patient is medically stable during transfer.  Please see below for detail:   # Multiple pressure ulcers with surrounding cellulitis:  -Patient has right posterior thigh, right posterior lower leg, sacral and gluteal ulcers. The ulcers with surrounding erythema concerning for cellulitis. Patient is currently on IV cefepime and oral doxycycline to add  coverage for MRSA. -Patient was evaluated by wound care service who recommended: Apply Santyl to wound to Rt posterior lower leg, Rt posterior thigh, and Rt trochanter, cover NS moist gauze and 4x4 dry gauze, secure with Medipore tape.   # Pseudomonas bacteremia in 1 bottle: This specimen was collected from Port-A-Cath. No tenderness or discharge around the catheter site. Antibiotics changed to cefepime. Follow-up pending culture results. I will not consult ID since patient is being transferred to [facility]. I discussed this with the accepting team there.  #Chronic kidney disease stage III: On reviewing labs from last few years, patient's serum creatinine level is around baseline. Continue to monitor BMP. Avoid nephrotoxins. Serum creatinine level of 1.5 today.  #Hypokalemia: Repleted potassium chloride on October 25. Encourage oral intake. Serum potassium level 5.3, acceptable today.  #  History of myelofibrosis, graft versus host disease and pancytopenia: -Patient with worsening leukopenia likely in the setting of infection. No sign of bleeding. Discussed with the bone marrow Center at [facility] where he will follow-up and now being transferred there. Continue to monitor.  # Essential hypertension: Blood pressure acceptable. Continue Coreg. Monitor blood pressure closely.  #Shortness of breath: Chest x-ray consistent with small pleural effusion and globular heart shape. May need further evaluation including echocardiogram for pericardial effusion. Patient has atelectasis on chest x-ray, unknown if he has pneumonitis. I discussed the x-ray finding with accepting team. He is currently comfortable and in room air with acceptable oxygen saturation.  Patient was also evaluated by palliative care team during this hospitalization.  Discharge Diagnoses:  Principal Problem:   Cellulitis Active Problems:   GERD   Essential hypertension   Myelofibrosis (HCC)   Hypothyroidism   Chronic diastolic CHF  (congestive heart failure) (HCC)   Depression with anxiety   Pancytopenia (HCC)   Hypokalemia  Neutropenia (HCC)   CKD (chronic kidney disease), stage II   Pressure sore   Cellulitis of right leg   Dyspnea   Palliative care encounter       Discharge Instructions  Discharge Instructions    Call MD for:  difficulty breathing, headache or visual disturbances    Complete by:  As directed    Call MD for:  persistant dizziness or light-headedness    Complete by:  As directed    Call MD for:  persistant nausea and vomiting    Complete by:  As directed    Call MD for:  severe uncontrolled pain    Complete by:  As directed    Call MD for:  temperature >100.4    Complete by:  As directed    Diet - low sodium heart healthy    Complete by:  As directed    Discharge instructions    Complete by:  As directed    Please follow up with PCP and at [facility]   Increase activity slowly    Complete by:  As directed        Medication List    STOP taking these medications   tacrolimus 0.5 MG capsule Commonly known as:  PROGRAF     TAKE these medications   CALCIUM 500/D 500-200 MG-UNIT tablet Generic drug:  calcium-vitamin D Take 1 tablet by mouth 4 (four) times daily.   carvedilol 6.25 MG tablet Commonly known as:  COREG Take 6.25 mg by mouth 2 (two) times daily with a meal.   ceFEPIme 2 g in dextrose 5 % 50 mL Inject 2 g into the vein every 12 (twelve) hours.   clobetasol cream 0.05 % Commonly known as:  TEMOVATE Apply 1 application topically 2 (two) times daily as needed (rash).   collagenase ointment Commonly known as:  SANTYL Apply topically daily.   doxycycline 100 MG tablet Commonly known as:  VIBRA-TABS Take 1 tablet (100 mg total) by mouth every 12 (twelve) hours.   enoxaparin 40 MG/0.4ML injection Commonly known as:  LOVENOX Inject 40 mg into the skin every evening.   erythromycin ophthalmic ointment 1 application at bedtime.   escitalopram 10 MG  tablet Commonly known as:  LEXAPRO Take 10 mg by mouth 2 (two) times daily.   FISH OIL + D3 PO Take 1 capsule by mouth daily.   pantoprazole 40 MG tablet Commonly known as:  PROTONIX Take 40 mg by mouth daily.   predniSONE 5 MG tablet Commonly known as:  DELTASONE Take 5 mg by mouth daily with breakfast. What changed:  Another medication with the same name was removed. Continue taking this medication, and follow the directions you see here.   senna-docusate 8.6-50 MG tablet Commonly known as:  Senokot-S Take 1 tablet by mouth 2 (two) times daily as needed for mild constipation.   valACYclovir 500 MG tablet Commonly known as:  VALTREX Take 1 tablet (500 mg total) by mouth daily.      Follow-up Information    Renato Shin, MD. Schedule an appointment as soon as possible for a visit in 1 week(s).   Specialty:  Endocrinology Contact information: [address] [place] [address] [phone]        Ples Specter, MD .   Specialty:  Internal Medicine Contact information: [address] Medicine [id number] Physician Office Building [place] [address] [phone]          No Known Allergies  Consultations:  None  Procedures/Studies: Dg Chest Portable 1 View  Result Date: 12/06/2015 CLINICAL DATA:  70 y/o  M; shortness of breath and weakness today. EXAM: PORTABLE CHEST 1 VIEW COMPARISON:  10/13/2015 chest radiograph FINDINGS: Enlarged cardiac silhouette a globular appearance, possibly accentuated technique. Right port catheter tip projects over lower SVC. Small bilateral pleural effusions, right greater than left and associated bibasilar opacities. Pulmonary vascular congestion. No acute osseous abnormality is evident. IMPRESSION: Right greater than left small effusions and bibasilar opacities which probably represent associated atelectasis. Pulmonary vascular congestion. Enlarged cardiac silhouette with a globular appearance, question  pericardial effusion. Electronically Signed   By: Kristine Garbe M.D.   On: 12/06/2015 20:54     Subjective: Patient was seen and examined at bedside. Patient reported weakness and fatigue. Denied headache, dizziness, nausea, vomiting, chest pain, shortness of breath or cough. Denies abdominal pain. No urinary or bowel symptoms.   Discharge Exam: Vitals:   12/08/15 0500 12/08/15 0840  BP: 118/73 (!) 150/75  Pulse:  82  Resp: 17 16  Temp: 99.1 F (37.3 C)    Vitals:   12/07/15 1637 12/07/15 2130 12/08/15 0500 12/08/15 0840  BP: (!) 146/73 102/74 118/73 (!) 150/75  Pulse: 76 76  82  Resp: 18 16 17 16   Temp: 98.1 F (36.7 C) 98.7 F (37.1 C) 99.1 F (37.3 C)   TempSrc: Oral Oral Oral   SpO2: 95% 94% 92% 95%  Weight:   70.1 kg (154 lb 8.7 oz)   Height:   5\' 9"  (1.753 m)     General: Pt is alert, awake, not in acute distress Cardiovascular: RRR, S1/S2 +, no rubs, no gallops Respiratory: CTA bilaterally, no wheezing, no rhonchi Abdominal: Soft, NT, ND, bowel sounds + Extremities: no edema, no cyanosis Chest: Right chest Port-A-Cath site clean with no sign of discharge, dressing on Skin: Multiple pressure ulcers on the sacrum, buttock, posterior right thigh and lower extremity with dressing on. CNS:: Alert, awake and oriented. No focal neurological deficit.  The results of significant diagnostics from this hospitalization (including imaging, microbiology, ancillary and laboratory) are listed below for reference.     Microbiology: Recent Results (from the past 240 hour(s))  Urine culture     Status: Abnormal (Preliminary result)   Collection Time: 12/06/15  7:59 PM  Result Value Ref Range Status   Specimen Description URINE, RANDOM  Final   Special Requests NONE  Final   Culture 20,000 COLONIES/mL ENTEROBACTER SPECIES (A)  Final   Report Status PENDING  Incomplete  Culture, blood (Routine X 2) w Reflex to ID Panel     Status: Abnormal (Preliminary result)    Collection Time: 12/06/15  8:24 PM  Result Value Ref Range Status   Specimen Description PORTA CATH  Final   Special Requests BOTTLES DRAWN AEROBIC AND ANAEROBIC 5CC  Final   Culture  Setup Time   Final    AEROBIC BOTTLE ONLY GRAM NEGATIVE RODS CRITICAL RESULT CALLED TO, READ BACK BY AND VERIFIED WITH: C. SUMME, PHARMD (WL) AT 2010 ON 12/07/15 BY C. JESSUP, MLT.    Culture (A)  Final    PSEUDOMONAS AERUGINOSA SUSCEPTIBILITIES TO FOLLOW Performed at [facility]    Report Status PENDING  Incomplete  Blood Culture ID Panel (Reflexed)     Status: Abnormal   Collection Time: 12/06/15  8:24 PM  Result Value Ref Range Status   Enterococcus species NOT DETECTED NOT DETECTED Final   Listeria monocytogenes NOT DETECTED NOT DETECTED Final   Staphylococcus species  NOT DETECTED NOT DETECTED Final   Staphylococcus aureus NOT DETECTED NOT DETECTED Final   Streptococcus species NOT DETECTED NOT DETECTED Final   Streptococcus agalactiae NOT DETECTED NOT DETECTED Final   Streptococcus pneumoniae NOT DETECTED NOT DETECTED Final   Streptococcus pyogenes NOT DETECTED NOT DETECTED Final   Acinetobacter baumannii NOT DETECTED NOT DETECTED Final   Enterobacteriaceae species NOT DETECTED NOT DETECTED Final   Enterobacter cloacae complex NOT DETECTED NOT DETECTED Final   Escherichia coli NOT DETECTED NOT DETECTED Final   Klebsiella oxytoca NOT DETECTED NOT DETECTED Final   Klebsiella pneumoniae NOT DETECTED NOT DETECTED Final   Proteus species NOT DETECTED NOT DETECTED Final   Serratia marcescens NOT DETECTED NOT DETECTED Final   Carbapenem resistance NOT DETECTED NOT DETECTED Final   Haemophilus influenzae NOT DETECTED NOT DETECTED Final   Neisseria meningitidis NOT DETECTED NOT DETECTED Final   Pseudomonas aeruginosa DETECTED (A) NOT DETECTED Final    Comment: CRITICAL RESULT CALLED TO, READ BACK BY AND VERIFIED WITH: C. SUMME, PHARMD (WL) AT 2010 ON 12/07/15 BY C. JESSUP, MLT.    Candida  albicans NOT DETECTED NOT DETECTED Final   Candida glabrata NOT DETECTED NOT DETECTED Final   Candida krusei NOT DETECTED NOT DETECTED Final   Candida parapsilosis NOT DETECTED NOT DETECTED Final   Candida tropicalis NOT DETECTED NOT DETECTED Final    Comment: Performed at [facility]  Culture, blood (Routine X 2) w Reflex to ID Panel     Status: None (Preliminary result)   Collection Time: 12/06/15  8:30 PM  Result Value Ref Range Status   Specimen Description LEFT ANTECUBITAL  Final   Special Requests BOTTLES DRAWN AEROBIC AND ANAEROBIC 5CC  Final   Culture   Final    NO GROWTH < 24 HOURS Performed at [facility]    Report Status PENDING  Incomplete     Labs: BNP (last 3 results)  Recent Labs  12/06/15 2015  BNP [id number]   Basic Metabolic Panel:  Recent Labs Lab 12/06/15 2015 12/06/15 2024 12/07/15 0600 12/08/15 0405  NA  --  137 139 138  K  --  3.2* 3.1* 4.3  CL  --  102 103 102  CO2  --  27 28 29   GLUCOSE  --  101* 90 93  BUN  --  39* 39* 34*  CREATININE  --  1.63* 1.60* 1.52*  CALCIUM  --  8.5* 8.4* 8.6*  MG 2.3  --   --   --    Liver Function Tests:  Recent Labs Lab 12/06/15 2024  AST 15  ALT 11*  ALKPHOS 79  BILITOT 0.5  PROT 5.0*  ALBUMIN 2.8*   No results for input(s): LIPASE, AMYLASE in the last 168 hours. No results for input(s): AMMONIA in the last 168 hours. CBC:  Recent Labs Lab 12/06/15 2024 12/08/15 0405  WBC 2.3* 1.6*  NEUTROABS 1.1* 0.7*  HGB 8.8* 8.7*  HCT 27.1* 26.6*  MCV 95.8 96.4  PLT 57* 112*   Cardiac Enzymes: No results for input(s): CKTOTAL, CKMB, CKMBINDEX, TROPONINI in the last 168 hours. BNP: Invalid input(s): POCBNP CBG: No results for input(s): GLUCAP in the last 168 hours. D-Dimer No results for input(s): DDIMER in the last 72 hours. Hgb A1c No results for input(s): HGBA1C in the last 72 hours. Lipid Profile No results for input(s): CHOL, HDL, LDLCALC, TRIG, CHOLHDL, LDLDIRECT in the last  72 hours. Thyroid function studies No results for input(s): TSH, T4TOTAL, T3FREE,  THYROIDAB in the last 72 hours.  Invalid input(s): FREET3 Anemia work up No results for input(s): VITAMINB12, FOLATE, FERRITIN, TIBC, IRON, RETICCTPCT in the last 72 hours. Urinalysis    Component Value Date/Time   COLORURINE AMBER (A) 12/06/2015 1958   APPEARANCEUR CLOUDY (A) 12/06/2015 1958   LABSPEC 1.023 12/06/2015 1958   PHURINE 5.5 12/06/2015 1958   GLUCOSEU NEGATIVE 12/06/2015 1958   GLUCOSEU NEGATIVE 03/24/2012 1126   HGBUR NEGATIVE 12/06/2015 1958   BILIRUBINUR SMALL (A) 12/06/2015 [place] NEGATIVE 12/06/2015 1958   PROTEINUR 30 (A) 12/06/2015 1958   UROBILINOGEN 0.2 10/09/2014 0113   NITRITE NEGATIVE 12/06/2015 1958   LEUKOCYTESUR NEGATIVE 12/06/2015 1958   Sepsis Labs Invalid input(s): PROCALCITONIN,  WBC,  LACTICIDVEN Microbiology Recent Results (from the past 240 hour(s))  Urine culture     Status: Abnormal (Preliminary result)   Collection Time: 12/06/15  7:59 PM  Result Value Ref Range Status   Specimen Description URINE, RANDOM  Final   Special Requests NONE  Final   Culture 20,000 COLONIES/mL ENTEROBACTER SPECIES (A)  Final   Report Status PENDING  Incomplete  Culture, blood (Routine X 2) w Reflex to ID Panel     Status: Abnormal (Preliminary result)   Collection Time: 12/06/15  8:24 PM  Result Value Ref Range Status   Specimen Description PORTA CATH  Final   Special Requests BOTTLES DRAWN AEROBIC AND ANAEROBIC 5CC  Final   Culture  Setup Time   Final    AEROBIC BOTTLE ONLY GRAM NEGATIVE RODS CRITICAL RESULT CALLED TO, READ BACK BY AND VERIFIED WITH: C. SUMME, PHARMD (WL) AT 2010 ON 12/07/15 BY C. JESSUP, MLT.    Culture (A)  Final    PSEUDOMONAS AERUGINOSA SUSCEPTIBILITIES TO FOLLOW Performed at [facility]    Report Status PENDING  Incomplete  Blood Culture ID Panel (Reflexed)     Status: Abnormal   Collection Time: 12/06/15  8:24 PM  Result Value  Ref Range Status   Enterococcus species NOT DETECTED NOT DETECTED Final   Listeria monocytogenes NOT DETECTED NOT DETECTED Final   Staphylococcus species NOT DETECTED NOT DETECTED Final   Staphylococcus aureus NOT DETECTED NOT DETECTED Final   Streptococcus species NOT DETECTED NOT DETECTED Final   Streptococcus agalactiae NOT DETECTED NOT DETECTED Final   Streptococcus pneumoniae NOT DETECTED NOT DETECTED Final   Streptococcus pyogenes NOT DETECTED NOT DETECTED Final   Acinetobacter baumannii NOT DETECTED NOT DETECTED Final   Enterobacteriaceae species NOT DETECTED NOT DETECTED Final   Enterobacter cloacae complex NOT DETECTED NOT DETECTED Final   Escherichia coli NOT DETECTED NOT DETECTED Final   Klebsiella oxytoca NOT DETECTED NOT DETECTED Final   Klebsiella pneumoniae NOT DETECTED NOT DETECTED Final   Proteus species NOT DETECTED NOT DETECTED Final   Serratia marcescens NOT DETECTED NOT DETECTED Final   Carbapenem resistance NOT DETECTED NOT DETECTED Final   Haemophilus influenzae NOT DETECTED NOT DETECTED Final   Neisseria meningitidis NOT DETECTED NOT DETECTED Final   Pseudomonas aeruginosa DETECTED (A) NOT DETECTED Final    Comment: CRITICAL RESULT CALLED TO, READ BACK BY AND VERIFIED WITH: C. SUMME, PHARMD (WL) AT 2010 ON 12/07/15 BY C. JESSUP, MLT.    Candida albicans NOT DETECTED NOT DETECTED Final   Candida glabrata NOT DETECTED NOT DETECTED Final   Candida krusei NOT DETECTED NOT DETECTED Final   Candida parapsilosis NOT DETECTED NOT DETECTED Final   Candida tropicalis NOT DETECTED NOT DETECTED Final    Comment: Performed at  [facility]  Culture, blood (Routine X 2) w Reflex to ID Panel     Status: None (Preliminary result)   Collection Time: 12/06/15  8:30 PM  Result Value Ref Range Status   Specimen Description LEFT ANTECUBITAL  Final   Special Requests BOTTLES DRAWN AEROBIC AND ANAEROBIC 5CC  Final   Culture   Final    NO GROWTH < 24 HOURS Performed at  [facility]    Report Status PENDING  Incomplete     Time coordinating discharge: Over 47 minutes  SIGNED:   Rosita Fire, MD  [facility] 12/08/2015, 1:57 PM Pager   If 7PM-7AM, please contact night-coverage [email or web address] Password [email or web address]

## 2015-12-08 NOTE — Progress Notes (Signed)
12/08/15  1550  Called [facility] transfer center [phone]. No beds available right now but have discharges scheduled for today. [facility] will call when bed is available. [place] number was given to transfer center.

## 2015-12-08 NOTE — Progress Notes (Signed)
Pt and family declined dressing change to [facility] due to transfer pending to [place]. "They are familiar with him there.  Dry 4x4 covering site without tegaderm due to skin sensitivity per pt.

## 2015-12-09 LAB — CULTURE, BLOOD (ROUTINE X 2)

## 2015-12-09 NOTE — Progress Notes (Signed)
Chart accessed by Croix Presley, RN to obtain records for blood cultures for Shanon Rosser, MD at [facility]. Novella Olive, RN

## 2015-12-10 LAB — URINE CULTURE

## 2015-12-11 LAB — CULTURE, BLOOD (ROUTINE X 2): CULTURE: NO GROWTH

## 2015-12-27 ENCOUNTER — Ambulatory Visit: Payer: Medicare Other | Admitting: Endocrinology

## 2016-01-09 ENCOUNTER — Telehealth: Payer: Self-pay | Admitting: Medical Oncology

## 2016-01-09 NOTE — Telephone Encounter (Signed)
My condolences extended to pts wife.

## 2016-01-13 DEATH — deceased

## 2016-02-03 ENCOUNTER — Other Ambulatory Visit: Payer: Self-pay | Admitting: Nurse Practitioner

## 2016-07-12 ENCOUNTER — Ambulatory Visit: Payer: Medicare Other | Admitting: Endocrinology

## 2016-09-23 IMAGING — CR DG CHEST 2V
2 series · 2 of 2 positions shown · non-contrast
Comparison: None

CLINICAL DATA: Fever and rash today, [AGE] post bone marrow
transplant, history hypertension, GERD

EXAM:
CHEST  2 VIEW

[w chest pa]
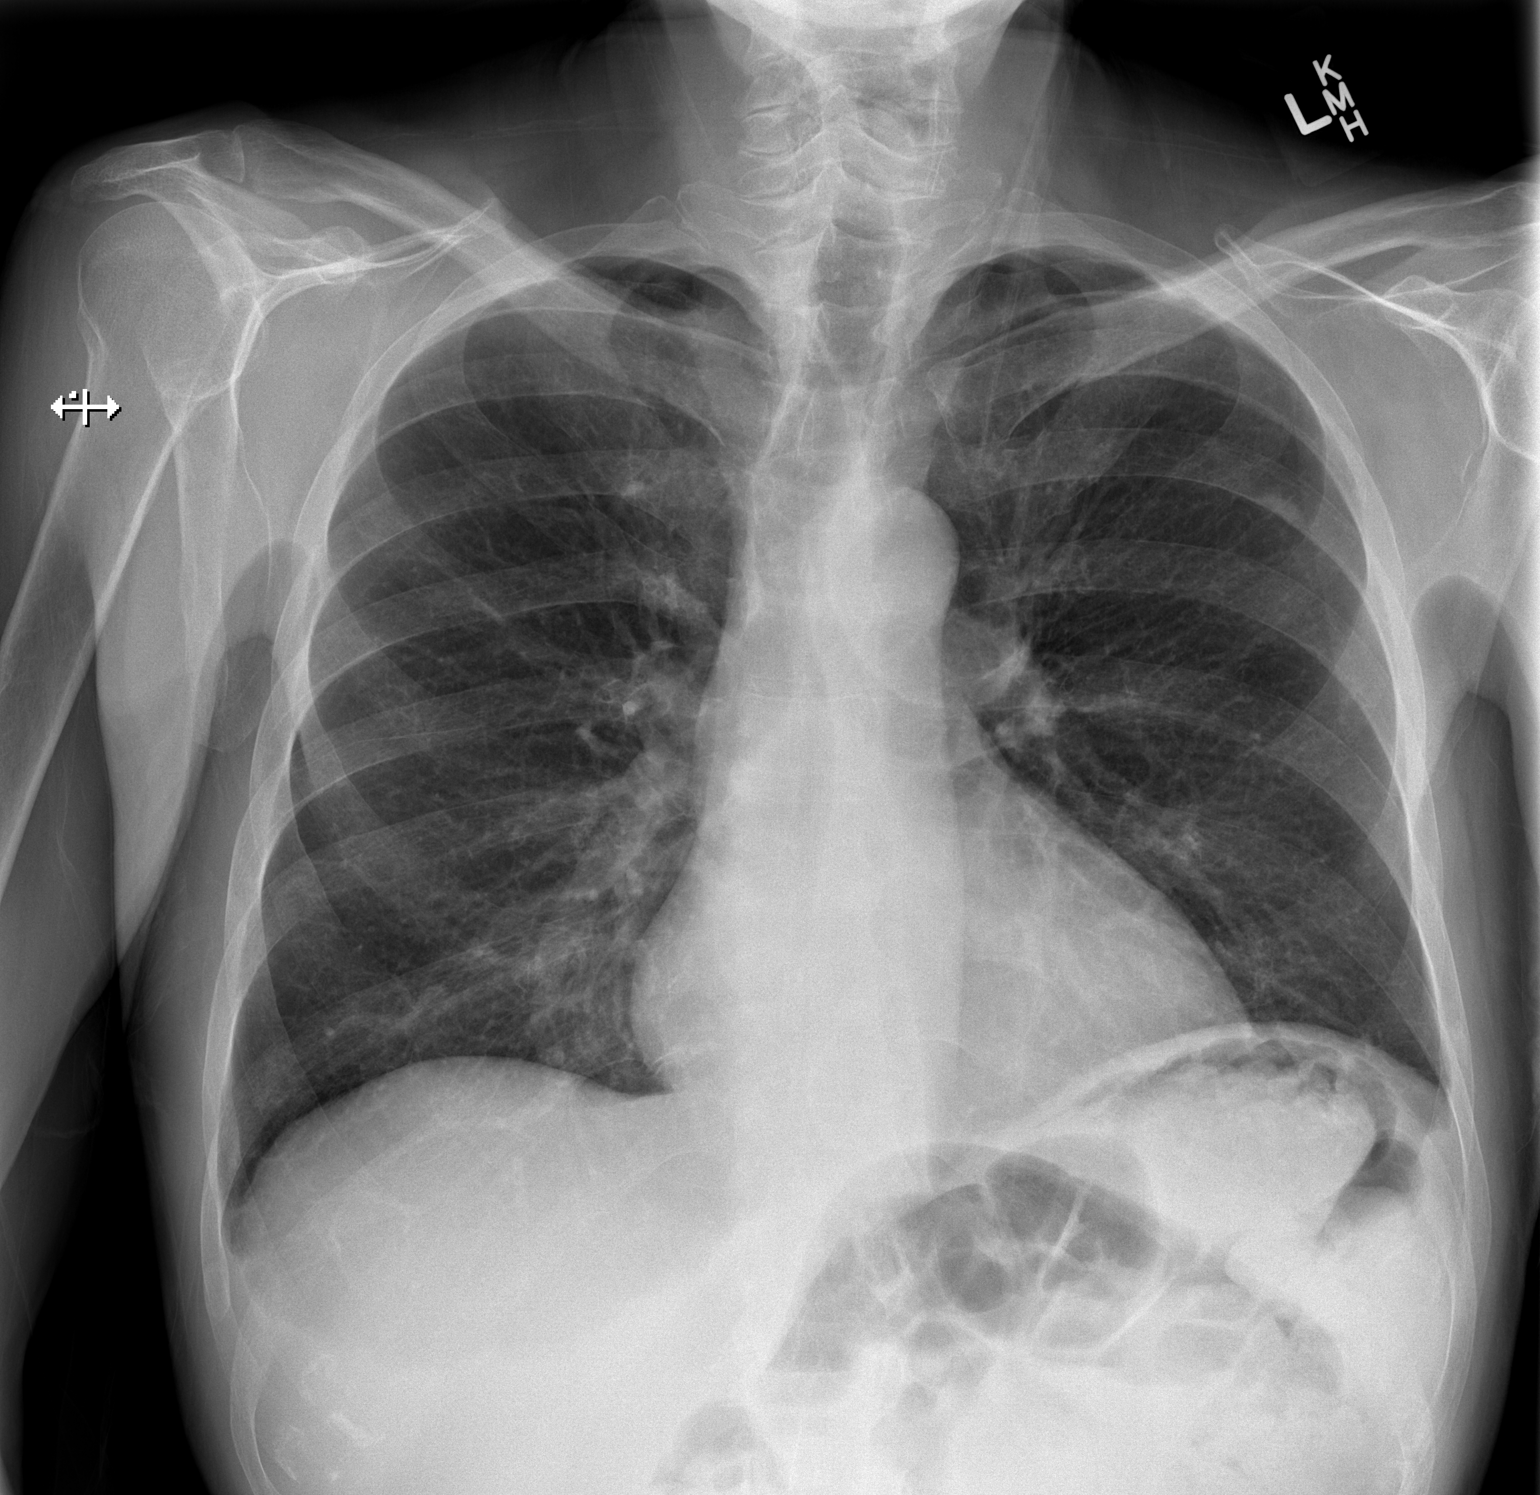

[w chest lat]
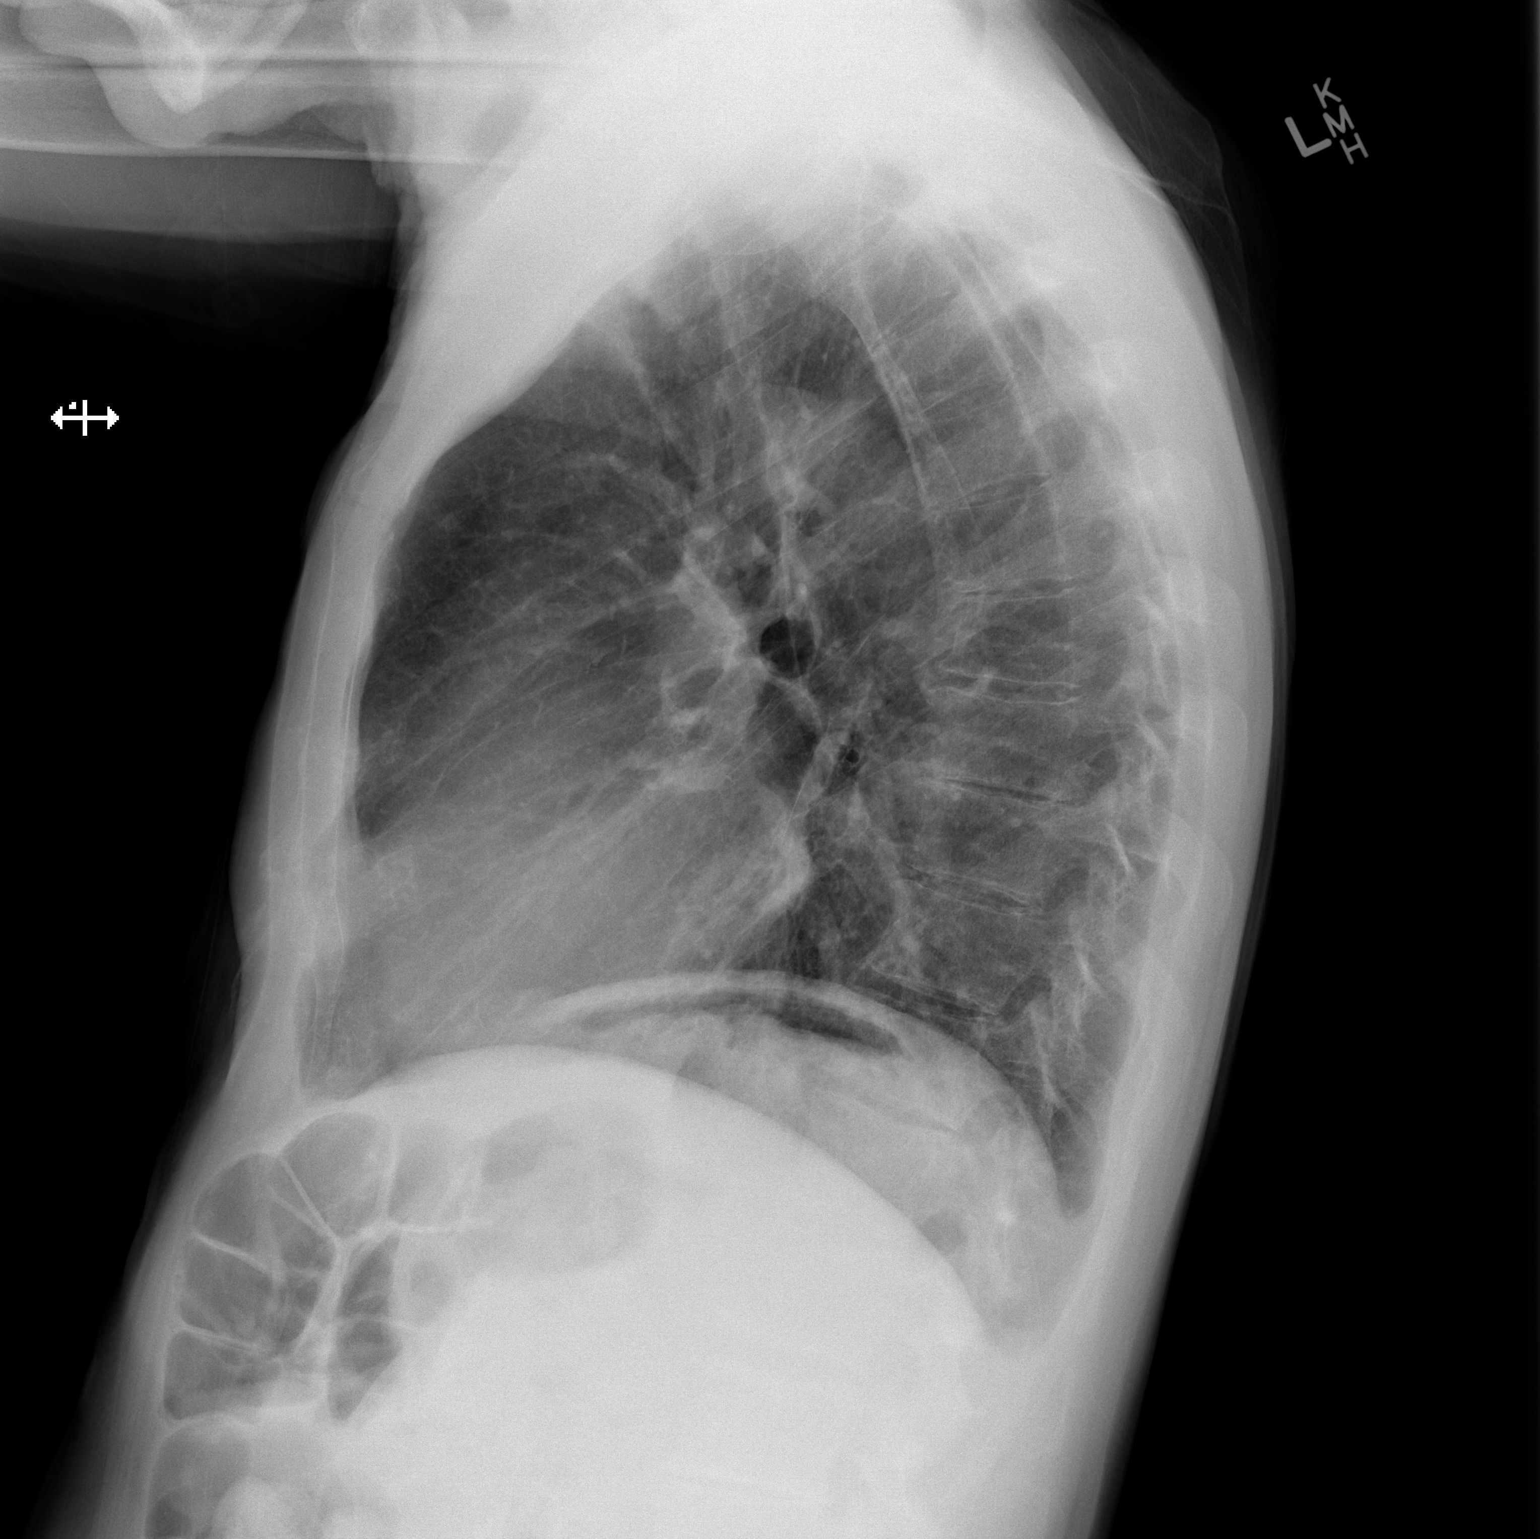

[2 of 2 positions shown; findings below may reference images not displayed]

FINDINGS: Upper normal heart size.

Mediastinal contours and pulmonary vascularity normal.

Bronchitic changes without infiltrate, pleural effusion or
pneumothorax.

Minimal elevation of LEFT diaphragm.

Subtle area of sclerosis at the inferior margin of the posterior
LEFT fifth rib, uncertain acuity.

No additional focal osseous abnormalities.
IMPRESSION: Mild bronchitic changes.

Subtle area of sclerosis at posterior LEFT fifth rib, uncertain
acuity; recommend comparison to prior outside chest radiographs to
determine stability.

## 2017-01-08 IMAGING — DX DG PORTABLE PELVIS
1 series · 1 of 1 positions shown · non-contrast
Comparison: Same day.

CLINICAL DATA: Status post left hip reduction.

EXAM:
PORTABLE PELVIS 1-2 VIEWS

[pelvis ap]
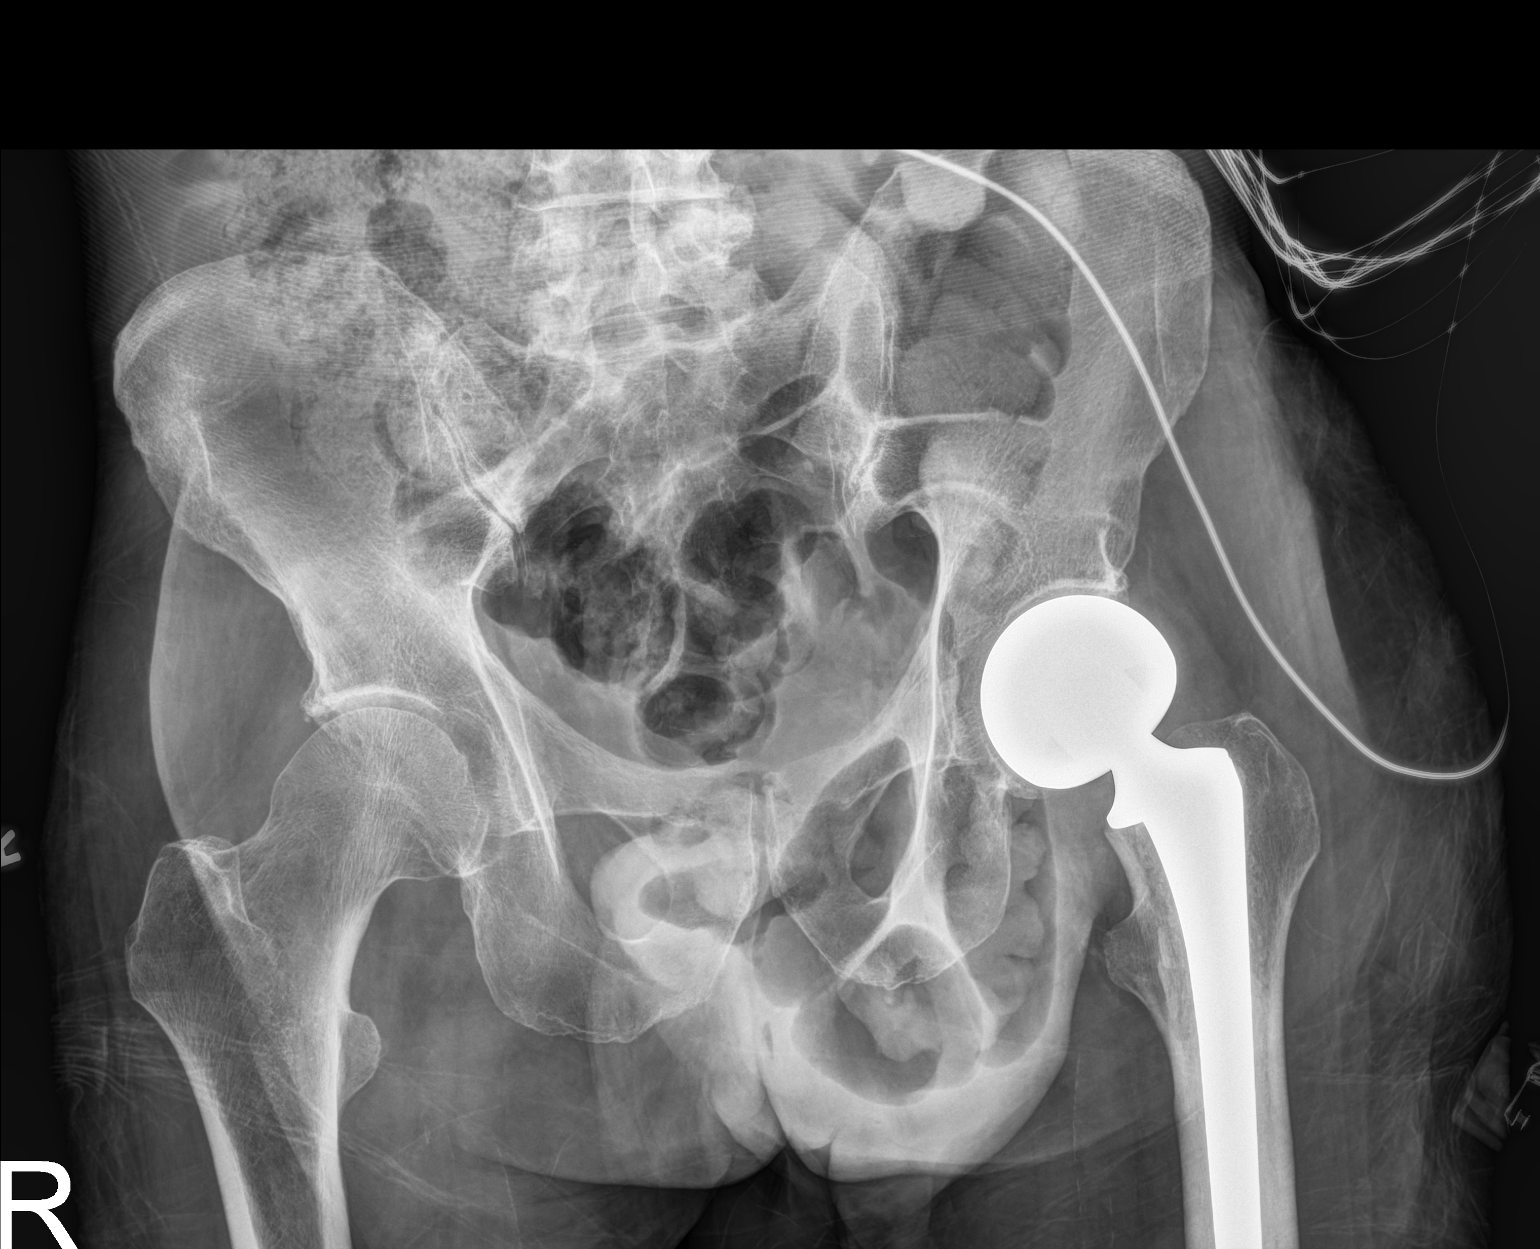

[1 of 1 positions shown; findings below may reference images not displayed]

FINDINGS: Dislocation of left hip hemiarthroplasty noted on prior exam has
been successfully reduced. No fracture is noted. Probable large left
groin hernia is again noted.
IMPRESSION: Successful reduction of previously described dislocation of left hip
hemiarthroplasty. No fracture is noted. Probable large left groin
hernia is noted.

## 2017-09-28 IMAGING — CR DG CHEST 2V
2 series · 2 of 2 positions shown · non-contrast
Comparison: 10/10/2014 .

CLINICAL DATA: Cough.

EXAM:
CHEST  2 VIEW

[view not recorded (1 of 2)]
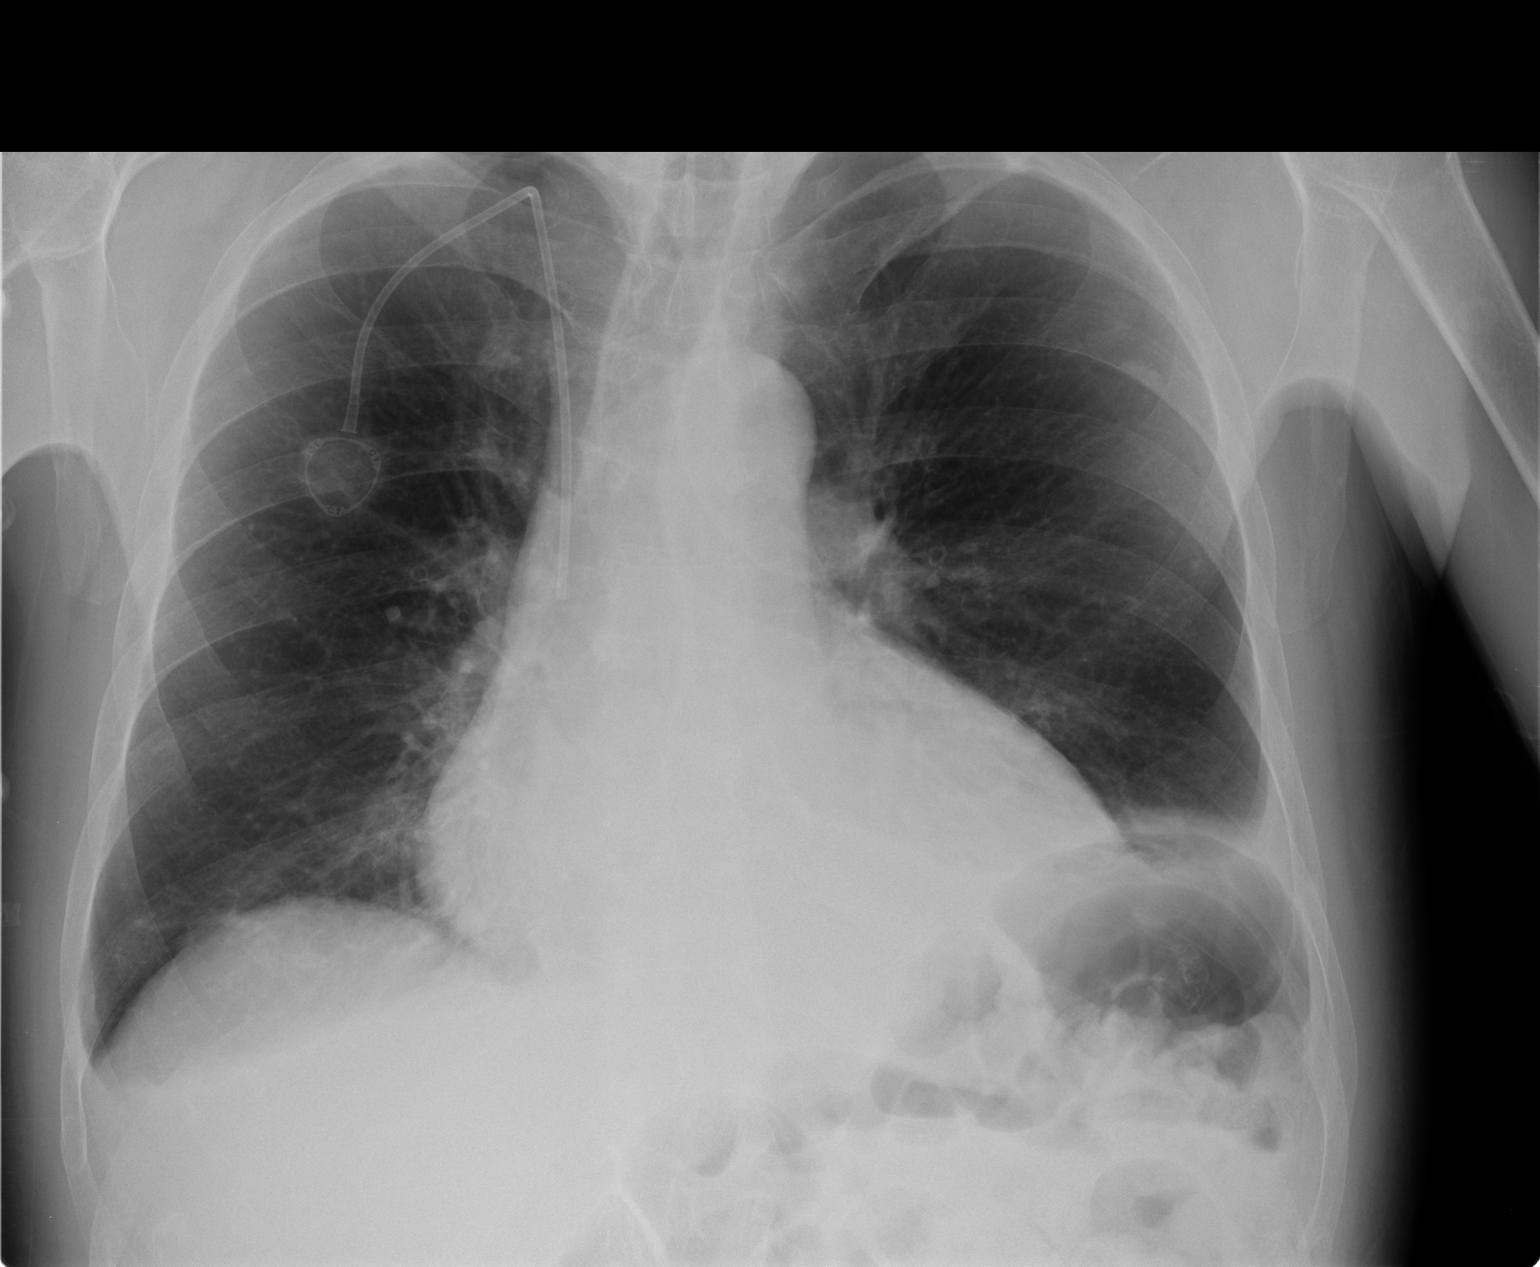

[view not recorded (2 of 2)]
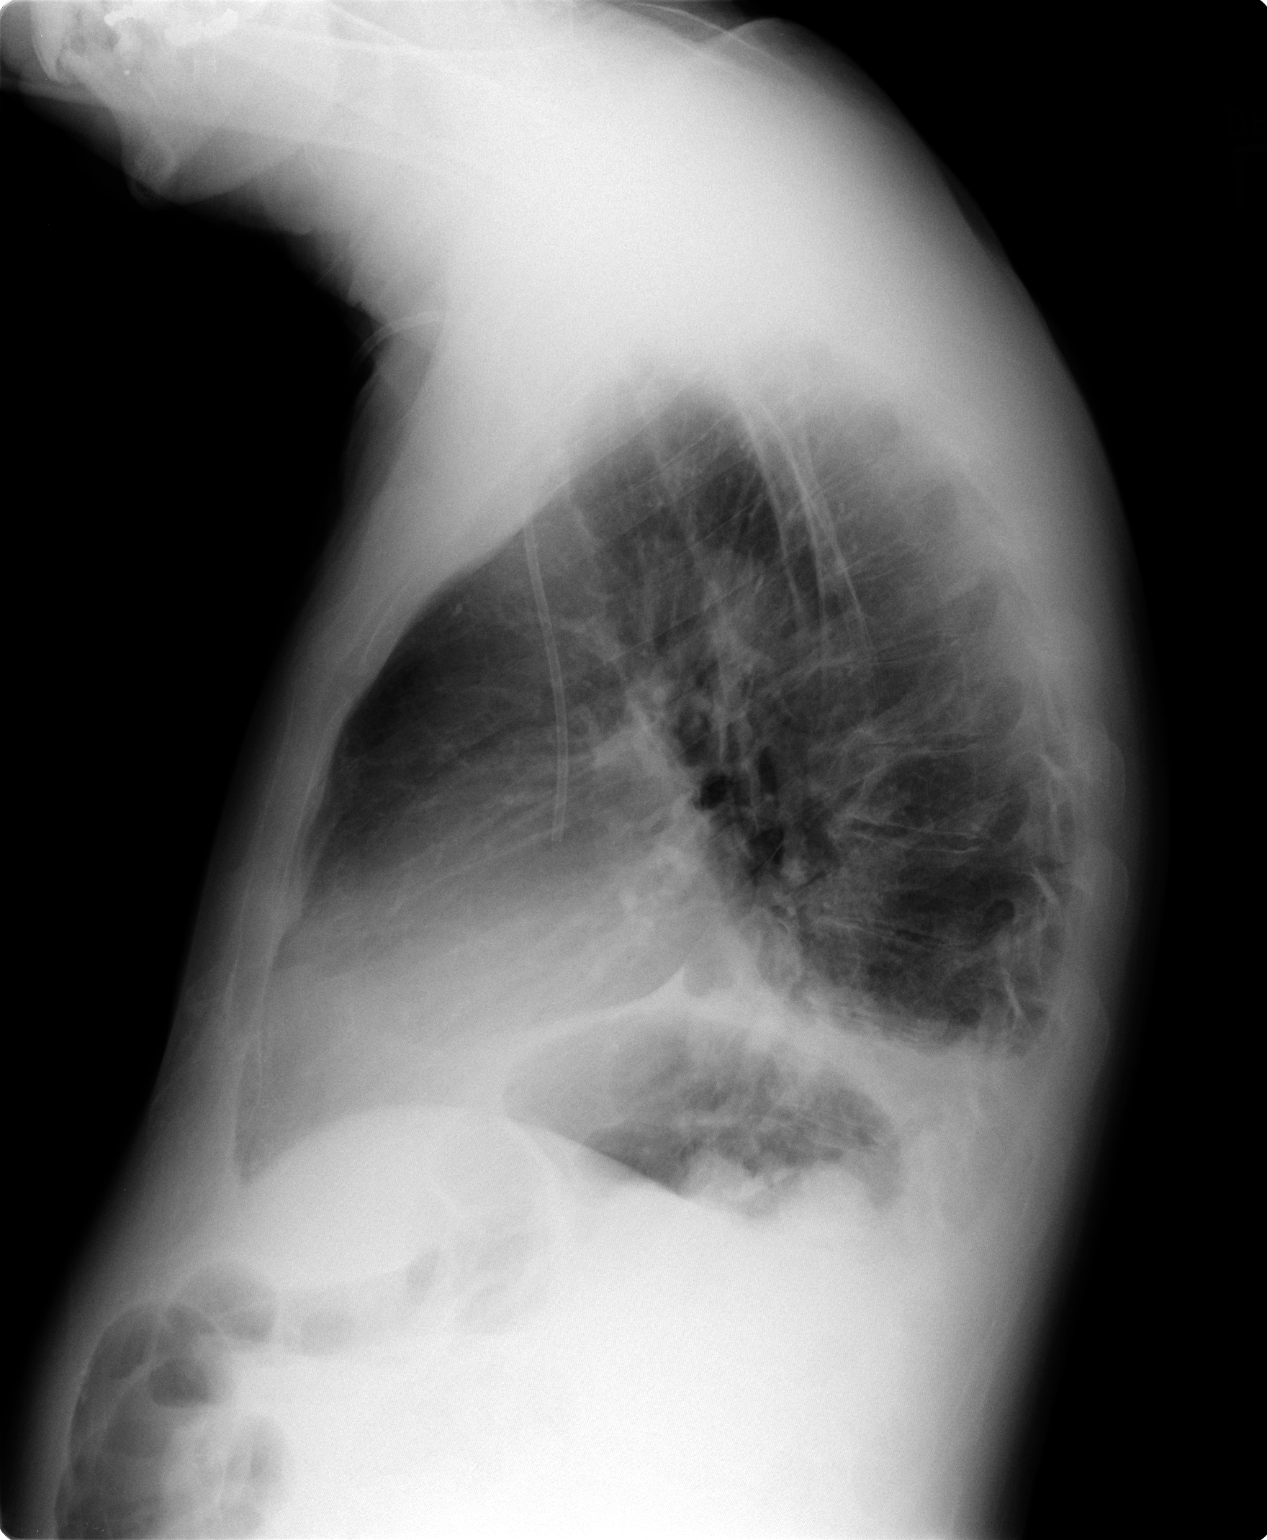

[2 of 2 positions shown; findings below may reference images not displayed]

FINDINGS: PowerPort catheter noted with tip at cavoatrial junction.
Mediastinum hilar structures are unremarkable. Mild basilar
atelectasis and/or infiltrates with small bilateral pleural
effusions. Stable tiny sclerotic focus noted along the inferior
aspect of the left fifth rib, most likely bone island. Tiny calcific
density projected over the left mid chest, most likely tiny
granuloma.
IMPRESSION: 1. PowerPort catheter in stable position.

2. New onset bibasilar atelectasis and/or infiltrates and bilateral
pleural effusions.

3. Cardiomegaly. A component of congestive heart failure cannot be
completely excluded .

## 2017-11-21 IMAGING — DX DG CHEST 1V PORT
1 series · 1 of 1 positions shown · non-contrast
Comparison: 10/13/2015 chest radiograph

CLINICAL DATA: 70 y/o  M; shortness of breath and weakness today.

EXAM:
PORTABLE CHEST 1 VIEW

[chest ap]
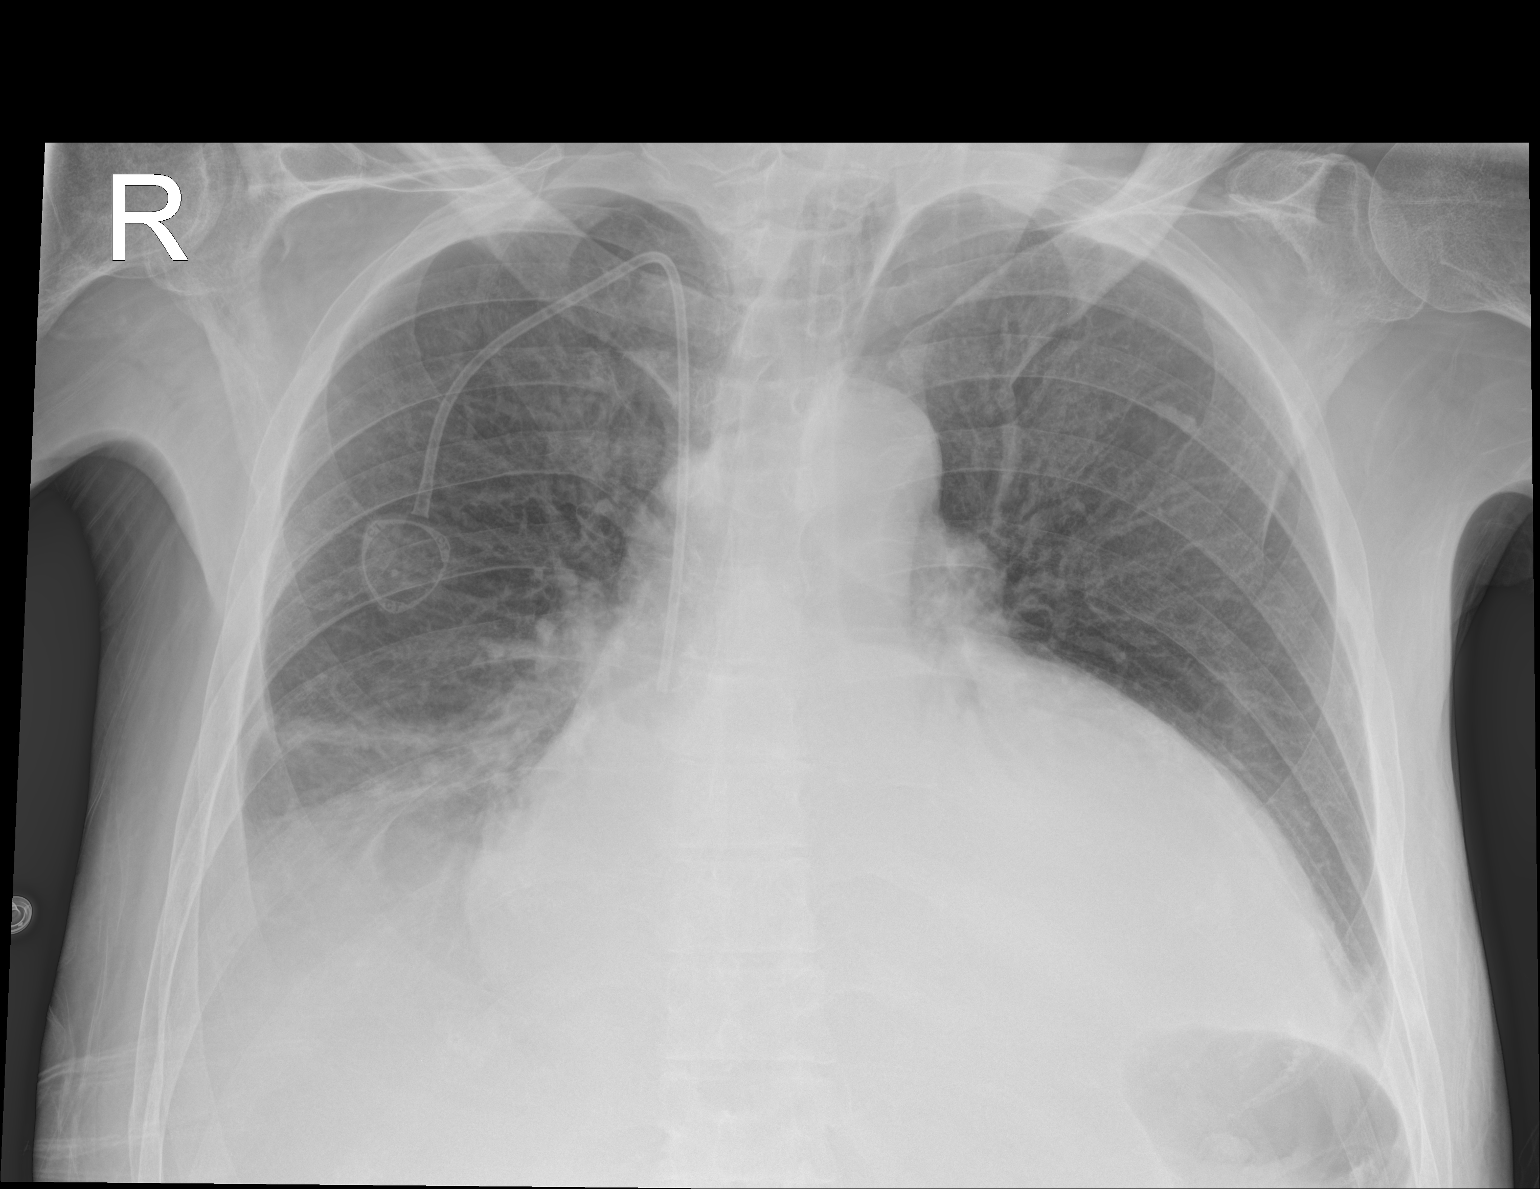

[1 of 1 positions shown; findings below may reference images not displayed]

FINDINGS: Enlarged cardiac silhouette a globular appearance, possibly
accentuated technique. Right port catheter tip projects over lower
SVC. Small bilateral pleural effusions, right greater than left and
associated bibasilar opacities. Pulmonary vascular congestion. No
acute osseous abnormality is evident.
IMPRESSION: Right greater than left small effusions and bibasilar opacities
which probably represent associated atelectasis. Pulmonary vascular
congestion. Enlarged cardiac silhouette with a globular appearance,
question pericardial effusion.

By: Sojud Ice M.D.
